# Patient Record
Sex: Female | Born: 1975
Health system: Southern US, Community
[De-identification: ages and names within clinical notes are randomized; demographics above are authoritative.]

## PROBLEM LIST (undated history)

## (undated) DIAGNOSIS — N200 Calculus of kidney: Secondary | ICD-10-CM

## (undated) DIAGNOSIS — IMO0002 Reserved for concepts with insufficient information to code with codable children: Secondary | ICD-10-CM

## (undated) DIAGNOSIS — I10 Essential (primary) hypertension: Secondary | ICD-10-CM

## (undated) DIAGNOSIS — I341 Nonrheumatic mitral (valve) prolapse: Secondary | ICD-10-CM

## (undated) DIAGNOSIS — K219 Gastro-esophageal reflux disease without esophagitis: Secondary | ICD-10-CM

## (undated) DIAGNOSIS — T7840XA Allergy, unspecified, initial encounter: Secondary | ICD-10-CM

## (undated) DIAGNOSIS — D649 Anemia, unspecified: Secondary | ICD-10-CM

## (undated) DIAGNOSIS — Z9889 Other specified postprocedural states: Secondary | ICD-10-CM

## (undated) DIAGNOSIS — J069 Acute upper respiratory infection, unspecified: Secondary | ICD-10-CM

## (undated) DIAGNOSIS — R112 Nausea with vomiting, unspecified: Secondary | ICD-10-CM

## (undated) DIAGNOSIS — Z87442 Personal history of urinary calculi: Secondary | ICD-10-CM

## (undated) DIAGNOSIS — J45909 Unspecified asthma, uncomplicated: Secondary | ICD-10-CM

## (undated) DIAGNOSIS — R7303 Prediabetes: Secondary | ICD-10-CM

## (undated) DIAGNOSIS — L309 Dermatitis, unspecified: Secondary | ICD-10-CM

## (undated) HISTORY — DX: Dermatitis, unspecified: L30.9

## (undated) HISTORY — PX: ADENOIDECTOMY, TONSILLECTOMY AND MYRINGOTOMY WITH TUBE PLACEMENT: SHX5716

## (undated) HISTORY — PX: TUBAL LIGATION: SHX77

## (undated) HISTORY — PX: ADENOIDECTOMY: SUR15

## (undated) HISTORY — PX: TONSILLECTOMY: SUR1361

## (undated) HISTORY — PX: FRACTURE SURGERY: SHX138

## (undated) HISTORY — DX: Unspecified asthma, uncomplicated: J45.909

## (undated) HISTORY — DX: Reserved for concepts with insufficient information to code with codable children: IMO0002

## (undated) HISTORY — DX: Allergy, unspecified, initial encounter: T78.40XA

## (undated) HISTORY — DX: Calculus of kidney: N20.0

## (undated) HISTORY — PX: LEG SURGERY: SHX1003

## (undated) HISTORY — DX: Acute upper respiratory infection, unspecified: J06.9

## (undated) HISTORY — DX: Anemia, unspecified: D64.9

## (undated) HISTORY — PX: HERNIA REPAIR: SHX51

## (undated) HISTORY — DX: Essential (primary) hypertension: I10

## (undated) HISTORY — PX: ABDOMINAL HYSTERECTOMY: SHX81

---

## 2013-12-17 ENCOUNTER — Encounter (HOSPITAL_COMMUNITY): Payer: Self-pay | Admitting: Emergency Medicine

## 2013-12-17 ENCOUNTER — Emergency Department (HOSPITAL_COMMUNITY)
Admission: EM | Admit: 2013-12-17 | Discharge: 2013-12-17 | Disposition: A | Discharge status: Home / Self Care | Payer: 59 | Source: Home / Self Care | Attending: Emergency Medicine | Admitting: Emergency Medicine

## 2013-12-17 DIAGNOSIS — J069 Acute upper respiratory infection, unspecified: Secondary | ICD-10-CM

## 2013-12-17 LAB — POCT RAPID STREP A: Streptococcus, Group A Screen (Direct): NEGATIVE

## 2013-12-17 MED ORDER — HYDROCOD POLST-CHLORPHEN POLST 10-8 MG/5ML PO LQCR: 5.0000 mL | Freq: Two times a day (BID) | ORAL | Status: DC | PRN

## 2013-12-17 MED ORDER — IPRATROPIUM BROMIDE 0.06 % NA SOLN: 2.0000 | Freq: Four times a day (QID) | NASAL | Status: DC

## 2013-12-17 MED ORDER — ONDANSETRON 8 MG PO TBDP: 8.0000 mg | ORAL_TABLET | Freq: Three times a day (TID) | ORAL | Status: DC | PRN

## 2013-12-17 MED ORDER — ALBUTEROL SULFATE HFA 108 (90 BASE) MCG/ACT IN AERS: 1.0000 | INHALATION_SPRAY | Freq: Four times a day (QID) | RESPIRATORY_TRACT | Status: DC | PRN

## 2013-12-17 NOTE — ED Provider Notes (Signed)
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 She denies any fever. Her husband and son had the same thing.  Review of Systems   Other than as noted above, the patient denies any of the following symptoms: Systemic:  No fevers, chills, sweats, or myalgias. Eye:  No redness or discharge. ENT:  No ear pain, headache, nasal congestion, drainage, sinus pressure, or sore throat. Neck:  No neck pain, stiffness, or swollen glands. Lungs:  No cough, sputum production, hemoptysis, wheezing, chest tightness, shortness of breath or chest pain. GI:  No abdominal pain, nausea, vomiting or diarrhea.  PMFSH   Past medical history, family history, social history, meds, and allergies were reviewed. She is allergic to erythromycin, sulfa, and azithromycin.  Physical exam   Vital signs:  BP 147/91  Pulse 82  Temp(Src) 98.8 F (37.1 C) (Oral)  Resp 16  SpO2 96% General:  Alert and oriented.  In no distress.  Skin warm and dry. Eye:  No conjunctival injection or drainage. Lids were normal. ENT:  TMs and canals were normal, without erythema or inflammation.  Nasal mucosa was clear and uncongested, without drainage.  Mucous membranes were moist.  Pharynx was clear with no exudate or drainage.  There were no oral ulcerations or lesions. Neck:  Supple, no adenopathy, tenderness or mass. Lungs:  No respiratory distress.  Lungs were clear to auscultation, without wheezes, rales or rhonchi.  Breath sounds were clear and equal bilaterally.  Heart:  Regular rhythm, without gallops, murmers or rubs. Skin:  Clear, warm, and dry, without rash or lesions.  Labs   Results for orders placed during the hospital encounter of  12/17/13  POCT RAPID STREP A ([facility] CARE ONLY)      Result Value Ref Range   Streptococcus, Group A Screen (Direct) NEGATIVE  NEGATIVE    Assessment     The encounter diagnosis was Viral URI.  No evidence of pneumonia, strep throat, or sinusitis, no indication for antibiotics.  Plan    1.  Meds:  The following meds were prescribed:   Discharge Medication List as of 12/17/2013 12:38 PM    START taking these medications   Details  albuterol (PROVENTIL HFA;VENTOLIN HFA) 108 (90 BASE) MCG/ACT inhaler Inhale 1-2 puffs into the lungs every 6 (six) hours as needed for wheezing or shortness of breath., Starting 12/17/2013, Until Discontinued, Normal    chlorpheniramine-HYDROcodone (TUSSIONEX) 10-8 MG/5ML LQCR Take 5 mLs by mouth every 12 (twelve) hours as needed for cough., Starting 12/17/2013, Until Discontinued, Normal    ipratropium (ATROVENT) 0.06 % nasal spray Place 2 sprays into both nostrils 4 (four) times daily., Starting 12/17/2013, Until Discontinued, Normal    ondansetron (ZOFRAN ODT) 8 MG disintegrating tablet Take 1 tablet (8 mg total) by mouth every 8 (eight) hours as needed for nausea., Starting 12/17/2013, Until Discontinued, Normal        2.  Patient Education/Counseling:  The patient was given appropriate handouts, self care instructions, and instructed in symptomatic relief.  Instructed to get extra fluids and extra rest.    3.  Follow up:  The patient was told to follow up here if no better in 3 to 4 days, or sooner if becoming worse in any  way, and given some red flag symptoms such as increasing fever, difficulty breathing, chest pain, or persistent vomiting which would prompt immediate return.       Reuben Likesavid C Hedaya Latendresse, MD 12/17/13 [phone]

## 2013-12-17 NOTE — ED Notes (Signed)
38 year old female c/o URI symptoms for the past 2 days.  Has sinus pressure and drainage, cough and sore throat.

## 2013-12-17 NOTE — Discharge Instructions (Signed)

## 2013-12-19 LAB — CULTURE, GROUP A STREP

## 2014-10-25 ENCOUNTER — Other Ambulatory Visit (HOSPITAL_COMMUNITY): Payer: Self-pay | Admitting: Family Medicine

## 2014-10-25 DIAGNOSIS — Z1231 Encounter for screening mammogram for malignant neoplasm of breast: Secondary | ICD-10-CM

## 2014-10-30 ENCOUNTER — Ambulatory Visit (HOSPITAL_COMMUNITY)
Admission: RE | Admit: 2014-10-30 | Discharge: 2014-10-30 | Disposition: A | Discharge status: Home / Self Care | Payer: 59 | Source: Ambulatory Visit | Attending: Family Medicine | Admitting: Family Medicine

## 2014-10-30 DIAGNOSIS — Z1231 Encounter for screening mammogram for malignant neoplasm of breast: Secondary | ICD-10-CM | POA: Diagnosis not present

## 2014-11-08 ENCOUNTER — Other Ambulatory Visit: Payer: Self-pay | Admitting: Family Medicine

## 2014-11-08 DIAGNOSIS — R928 Other abnormal and inconclusive findings on diagnostic imaging of breast: Secondary | ICD-10-CM

## 2014-11-09 ENCOUNTER — Other Ambulatory Visit (HOSPITAL_COMMUNITY): Payer: Self-pay | Admitting: Physician Assistant

## 2014-11-09 DIAGNOSIS — R928 Other abnormal and inconclusive findings on diagnostic imaging of breast: Secondary | ICD-10-CM

## 2014-11-14 ENCOUNTER — Ambulatory Visit (HOSPITAL_COMMUNITY)
Admission: RE | Admit: 2014-11-14 | Discharge: 2014-11-14 | Disposition: A | Discharge status: Home / Self Care | Payer: 59 | Source: Ambulatory Visit | Attending: Physician Assistant | Admitting: Physician Assistant

## 2014-11-14 ENCOUNTER — Encounter (HOSPITAL_COMMUNITY): Payer: 59

## 2014-11-14 ENCOUNTER — Other Ambulatory Visit (HOSPITAL_COMMUNITY): Payer: Self-pay | Admitting: Physician Assistant

## 2014-11-14 DIAGNOSIS — R928 Other abnormal and inconclusive findings on diagnostic imaging of breast: Secondary | ICD-10-CM | POA: Insufficient documentation

## 2014-11-14 DIAGNOSIS — N6489 Other specified disorders of breast: Secondary | ICD-10-CM

## 2015-04-09 DIAGNOSIS — Z1389 Encounter for screening for other disorder: Secondary | ICD-10-CM | POA: Diagnosis not present

## 2015-04-09 DIAGNOSIS — J019 Acute sinusitis, unspecified: Secondary | ICD-10-CM | POA: Diagnosis not present

## 2015-04-09 DIAGNOSIS — E559 Vitamin D deficiency, unspecified: Secondary | ICD-10-CM | POA: Diagnosis not present

## 2015-05-18 DIAGNOSIS — Z6841 Body Mass Index (BMI) 40.0 and over, adult: Secondary | ICD-10-CM | POA: Diagnosis not present

## 2015-05-18 DIAGNOSIS — Z1389 Encounter for screening for other disorder: Secondary | ICD-10-CM | POA: Diagnosis not present

## 2015-05-18 DIAGNOSIS — J019 Acute sinusitis, unspecified: Secondary | ICD-10-CM | POA: Diagnosis not present

## 2015-05-18 DIAGNOSIS — E559 Vitamin D deficiency, unspecified: Secondary | ICD-10-CM | POA: Diagnosis not present

## 2015-08-30 DIAGNOSIS — Z6841 Body Mass Index (BMI) 40.0 and over, adult: Secondary | ICD-10-CM | POA: Diagnosis not present

## 2015-08-30 DIAGNOSIS — Z1389 Encounter for screening for other disorder: Secondary | ICD-10-CM | POA: Diagnosis not present

## 2015-08-30 DIAGNOSIS — R030 Elevated blood-pressure reading, without diagnosis of hypertension: Secondary | ICD-10-CM | POA: Diagnosis not present

## 2015-10-01 DIAGNOSIS — Z1389 Encounter for screening for other disorder: Secondary | ICD-10-CM | POA: Diagnosis not present

## 2015-10-01 DIAGNOSIS — L0291 Cutaneous abscess, unspecified: Secondary | ICD-10-CM | POA: Diagnosis not present

## 2015-10-01 DIAGNOSIS — B9562 Methicillin resistant Staphylococcus aureus infection as the cause of diseases classified elsewhere: Secondary | ICD-10-CM | POA: Diagnosis not present

## 2015-10-01 DIAGNOSIS — Z6841 Body Mass Index (BMI) 40.0 and over, adult: Secondary | ICD-10-CM | POA: Diagnosis not present

## 2015-10-01 MED FILL — DOXYCYCLINE HYCLATE 100 MG: 100 | 7 days supply | Qty: 14 | Fill #0

## 2015-11-23 DIAGNOSIS — J329 Chronic sinusitis, unspecified: Secondary | ICD-10-CM | POA: Diagnosis not present

## 2015-11-23 DIAGNOSIS — Z6841 Body Mass Index (BMI) 40.0 and over, adult: Secondary | ICD-10-CM | POA: Diagnosis not present

## 2015-11-23 DIAGNOSIS — Z1389 Encounter for screening for other disorder: Secondary | ICD-10-CM | POA: Diagnosis not present

## 2015-11-23 DIAGNOSIS — J029 Acute pharyngitis, unspecified: Secondary | ICD-10-CM | POA: Diagnosis not present

## 2015-12-04 ENCOUNTER — Emergency Department (HOSPITAL_COMMUNITY)
Admission: EM | Admit: 2015-12-04 | Discharge: 2015-12-05 | Disposition: A | Discharge status: Home / Self Care | Payer: 59 | Attending: Emergency Medicine | Admitting: Emergency Medicine

## 2015-12-04 ENCOUNTER — Emergency Department (HOSPITAL_COMMUNITY): Payer: 59

## 2015-12-04 ENCOUNTER — Encounter (HOSPITAL_COMMUNITY): Payer: Self-pay | Admitting: *Deleted

## 2015-12-04 DIAGNOSIS — K219 Gastro-esophageal reflux disease without esophagitis: Secondary | ICD-10-CM | POA: Diagnosis not present

## 2015-12-04 DIAGNOSIS — R0789 Other chest pain: Secondary | ICD-10-CM

## 2015-12-04 HISTORY — DX: Nonrheumatic mitral (valve) prolapse: I34.1

## 2015-12-04 LAB — CBC
HCT: 37.2 % (ref 36.0–46.0)
Hemoglobin: 12.3 g/dL (ref 12.0–15.0)
MCH: 29.4 pg (ref 26.0–34.0)
MCHC: 33.1 g/dL (ref 30.0–36.0)
MCV: 88.8 fL (ref 78.0–100.0)
PLATELETS: 256 10*3/uL (ref 150–400)
RBC: 4.19 MIL/uL (ref 3.87–5.11)
RDW: 14 % (ref 11.5–15.5)
WBC: 10.2 10*3/uL (ref 4.0–10.5)

## 2015-12-04 NOTE — ED Triage Notes (Signed)
Pt c/o having a feeling of heartburn earlier and when she went to lay down she felt like her heart was racing; pt states she has not been feeling well since she received the flu shot yesterday

## 2015-12-04 NOTE — ED Provider Notes (Signed)
[facility]-EMERGENCY DEPT Provider Note   CSN: [id number] Arrival date & time: 12/04/15  2314  By signing my name below, I, Monica Hernandez, attest that this documentation has been prepared under the direction and in the presence of Devoria AlbeIva Dublin Cantero, MD. Electronically Signed: Phillis HaggisGabriella Hernandez, ED Scribe. 12/04/15. 12:17 AM.  Time seen 12:02 AM   History   Chief Complaint Chief Complaint  Patient presents with  . Chest Pain   The history is provided by the patient. No language interpreter was used.   HPI Comments: Monica Hernandez is a 40 y.o. female with a hx of mitral valve prolapse who presents to the Emergency Department complaining of burning, pressured, squeezing, gradually resolving central chest pain onset around 6 pm. She reports associated nausea, palpitations, hot flashes, and heartburn that began today. The nausea and hot flashes have since resolved; they lasted about 3 hours. Pt had intermittent hot flashes today preceding her chest pain. Pt laid in bed this evening when she began to have SOB. Pt reports relief with sitting up. Pt reports that her symptoms began after she received her flu shot at 3 PM yesterday. She had nausea and hot flashes that lasted about 3 hours shortly after she got the flu shot and then she felt fine. Pt has had the flu shot in the past with no adverse effects. She notes that she recently finished a course of antibiotics for a respiratory infection. Pt took an over-the-counter Zantac for her heartburn which relieved that, but then developed a pressure in her chest and became squeezing when she laid down. She states laying flat [place] it feel worse, sitting up made it better. Pt was previously on prescription Zantac for peptic ulcers years ago. Pt is a former smoker and drinker. Pt reports family hx of heart disease. Pt's father had 2 heart attacks before the age of 450 and died at age 42 of terminal cancer. Pt's mother has hx of stroke and is still living at age 42 with  dementia. Pt denies nausea tonight, diaphoresis, fever, abdominal pain, or vomiting. Pt denies use of daily medications. She states she felt something like this before when she took contrary to lose weight and she had heart palpitations. She states when she first got to the ED she felt like her heart was fluttering and then she got very shaky.  PCP [facility]  Past Medical History:  Diagnosis Date  . Gestational diabetes   . Mitral valve prolapse     There are no active problems to display for this patient.   Past Surgical History:  Procedure Laterality Date  . ABDOMINAL HYSTERECTOMY    . ADENOIDECTOMY, TONSILLECTOMY AND MYRINGOTOMY WITH TUBE PLACEMENT    . LEG SURGERY Left   . TUBAL LIGATION      OB History    No data available     Home Medications    Prior to Admission medications   Medication Sig Start Date End Date Taking? Authorizing Provider  omeprazole (PRILOSEC) 20 MG capsule Take 1 po BID x 2 weeks then once a day 12/05/15   Devoria AlbeIva Garvin Ellena, MD    Family History History reviewed. No pertinent family history.  Social History Social History  Substance Use Topics  . Smoking status: Never Smoker  . Smokeless tobacco: Never Used  . Alcohol use No  employed   Allergies   Erythromycin; Zithromax [azithromycin]; and Sulfa antibiotics   Review of Systems Review of Systems  Constitutional: Negative for diaphoresis and fever.  Respiratory:  Positive for shortness of breath.   Cardiovascular: Positive for chest pain and palpitations.  Gastrointestinal: Positive for nausea. Negative for abdominal pain and vomiting.  All other systems reviewed and are negative.  Physical Exam Updated Vital Signs BP 151/80 (BP Location: Right Arm)   Pulse 85   Temp 98.1 F (36.7 C) (Oral)   Resp 17   Ht 5\' 5"  (1.651 m)   Wt 299 lb (135.6 kg)   SpO2 98%   BMI 49.76 kg/m   Vital signs normal    Physical Exam  Constitutional: She is oriented to person, place, and time. She  appears well-developed and well-nourished.  Non-toxic appearance. She does not appear ill. No distress.  HENT:  Head: Normocephalic and atraumatic.  Right Ear: External ear normal.  Left Ear: External ear normal.  Nose: Nose normal. No mucosal edema or rhinorrhea.  Mouth/Throat: Oropharynx is clear and moist and mucous membranes are normal. No dental abscesses or uvula swelling.  Eyes: Conjunctivae and EOM are normal. Pupils are equal, round, and reactive to light.  Neck: Normal range of motion and full passive range of motion without pain. Neck supple.  Cardiovascular: Normal rate, regular rhythm and normal heart sounds.  Exam reveals no gallop and no friction rub.   No murmur heard. Pulmonary/Chest: Effort normal and breath sounds normal. No respiratory distress. She has no wheezes. She has no rhonchi. She has no rales. She exhibits no tenderness and no crepitus.  Abdominal: Soft. Normal appearance and bowel sounds are normal. She exhibits no distension. There is no tenderness. There is no rebound and no guarding.  Musculoskeletal: Normal range of motion. She exhibits no edema or tenderness.  Moves all extremities well.   Neurological: She is alert and oriented to person, place, and time. She has normal strength. No cranial nerve deficit.  Skin: Skin is warm, dry and intact. No rash noted. No erythema. No pallor.  Psychiatric: She has a normal mood and affect. Her speech is normal and behavior is normal. Her mood appears not anxious.  Nursing note and vitals reviewed.    ED Treatments / Results  Labs (all labs ordered are listed, but only abnormal results are displayed) Results for orders placed or performed during the hospital encounter of 12/04/15  Basic metabolic panel  Result Value Ref Range   Sodium 137 135 - 145 mmol/L   Potassium 3.6 3.5 - 5.1 mmol/L   Chloride 104 101 - 111 mmol/L   CO2 26 22 - 32 mmol/L   Glucose, Bld 116 (H) 65 - 99 mg/dL   BUN 14 6 - 20 mg/dL    Creatinine, Ser 1.61 0.44 - 1.00 mg/dL   Calcium 8.9 8.9 - 09.6 mg/dL   GFR calc non Af Amer >60 >60 mL/min   GFR calc Af Amer >60 >60 mL/min   Anion gap 7 5 - 15  CBC  Result Value Ref Range   WBC 10.2 4.0 - 10.5 K/uL   RBC 4.19 3.87 - 5.11 MIL/uL   Hemoglobin 12.3 12.0 - 15.0 g/dL   HCT 04.5 40.9 - 81.1 %   MCV 88.8 78.0 - 100.0 fL   MCH 29.4 26.0 - 34.0 pg   MCHC 33.1 30.0 - 36.0 g/dL   RDW 91.4 78.2 - 95.6 %   Platelets 256 150 - 400 K/uL  Lipase, blood  Result Value Ref Range   Lipase 28 11 - 51 U/L  Hepatic function panel  Result Value Ref Range   Total Protein  7.4 6.5 - 8.1 g/dL   Albumin 3.9 3.5 - 5.0 g/dL   AST 16 15 - 41 U/L   ALT 13 (L) 14 - 54 U/L   Alkaline Phosphatase 72 38 - 126 U/L   Total Bilirubin 0.2 (L) 0.3 - 1.2 mg/dL   Bilirubin, Direct <1.6 (L) 0.1 - 0.5 mg/dL   Indirect Bilirubin NOT CALCULATED 0.3 - 0.9 mg/dL  D-dimer, quantitative  Result Value Ref Range   D-Dimer, Quant <0.27 0.00 - 0.50 ug/mL-FEU  Troponin I  Result Value Ref Range   Troponin I <0.03 <0.03 ng/mL  Troponin I  Result Value Ref Range   Troponin I <0.03 <0.03 ng/mL   Laboratory interpretation all normal     EKG  EKG Interpretation  Date/Time:  Tuesday December 04 2015 23:26:06 EDT Ventricular Rate:  83 PR Interval:    QRS Duration: 102 QT Interval:  379 QTC Calculation: 446 R Axis:   45 Text Interpretation:  Sinus rhythm U waves present Otherwise within normal limits No old tracing to compare Confirmed by Glenda Kunst  MD-I, Akeira Lahm ([id number]) on 12/04/2015 11:54:43 PM       Radiology Dg Chest 2 View  Result Date: 12/05/2015 CLINICAL DATA:  Acute onset of intermittent left-sided chest pain. Initial encounter. EXAM: CHEST  2 VIEW COMPARISON:  None. FINDINGS: The lungs are well-aerated and clear. There is no evidence of focal opacification, pleural effusion or pneumothorax. The heart is normal in size. No acute osseous abnormalities are seen. IMPRESSION: No acute cardiopulmonary  process seen. Electronically Signed   By: Roanna Raider M.D.   On: 12/05/2015 00:40    Procedures Procedures (including critical care time)  Medications Ordered in ED Medications  gi cocktail (Maalox,Lidocaine,Donnatal) (30 mLs Oral Given 12/05/15 0023)  famotidine (PEPCID) tablet 20 mg (20 mg Oral Given 12/05/15 0023)     Initial Impression / Assessment and Plan / ED Course    I have reviewed the triage vital signs and the nursing notes.  Pertinent labs & imaging results that were available during my care of the patient were reviewed by me and considered in my medical decision making (see chart for details).  Clinical Course   COORDINATION OF CARE: 12:12 AM-Discussed treatment plan which includes labs and EKG with pt at bedside and pt agreed to plan. Patient was given a GI cocktail and Pepcid for her presumed reflux symptoms.  Patient was rechecked at 1:30, she relates her symptoms are gone. At this point we discussed her laboratory test results and need to get a delta troponin.  Patient's delta troponin was negative. Her symptoms are felt to be related to GERD or possibly recurrence of her peptic ulcer disease. She also may have a hiatal hernia.    Final Clinical Impressions(s) / ED Diagnoses   Final diagnoses:  Other chest pain  Gastroesophageal reflux disease without esophagitis    New Prescriptions Discharge Medication List as of 12/05/2015  2:52 AM    START taking these medications   Details  omeprazole (PRILOSEC) 20 MG capsule Take 1 po BID x 2 weeks then once a day, Print        Plan discharge  Devoria Albe, MD, FACEP  I personally performed the services described in this documentation, which was scribed in my presence. The recorded information has been reviewed and considered.  Devoria Albe, MD, Concha Pyo, MD 12/05/15 [phone]

## 2015-12-05 DIAGNOSIS — K219 Gastro-esophageal reflux disease without esophagitis: Secondary | ICD-10-CM | POA: Diagnosis not present

## 2015-12-05 DIAGNOSIS — R079 Chest pain, unspecified: Secondary | ICD-10-CM | POA: Diagnosis not present

## 2015-12-05 LAB — HEPATIC FUNCTION PANEL
ALK PHOS: 72 U/L (ref 38–126)
ALT: 13 U/L — ABNORMAL LOW (ref 14–54)
AST: 16 U/L (ref 15–41)
Albumin: 3.9 g/dL (ref 3.5–5.0)
BILIRUBIN TOTAL: 0.2 mg/dL — AB (ref 0.3–1.2)
Bilirubin, Direct: <0.1 mg/dL — ABNORMAL LOW (ref 0.1–0.5)
Total Protein: 7.4 g/dL (ref 6.5–8.1)

## 2015-12-05 LAB — BASIC METABOLIC PANEL
Anion gap: 7 (ref 5–15)
BUN: 14 mg/dL (ref 6–20)
CO2: 26 mmol/L (ref 22–32)
Calcium: 8.9 mg/dL (ref 8.9–10.3)
Chloride: 104 mmol/L (ref 101–111)
Creatinine, Ser: 0.85 mg/dL (ref 0.44–1.00)
GFR calc Af Amer: >60 mL/min (ref >60)
GFR calc non Af Amer: >60 mL/min (ref >60)
GLUCOSE: 116 mg/dL — AB (ref 65–99)
POTASSIUM: 3.6 mmol/L (ref 3.5–5.1)
Sodium: 137 mmol/L (ref 135–145)

## 2015-12-05 LAB — TROPONIN I
Troponin I: <0.03 ng/mL (ref <0.03)
Troponin I: <0.03 ng/mL (ref <0.03)

## 2015-12-05 LAB — LIPASE, BLOOD: Lipase: 28 U/L (ref 11–51)

## 2015-12-05 LAB — D-DIMER, QUANTITATIVE: D-Dimer, Quant: <0.27 ug/mL-FEU (ref 0.00–0.50)

## 2015-12-05 MED ORDER — FAMOTIDINE 20 MG PO TABS
20.0000 mg | ORAL_TABLET | Freq: Once | ORAL | Status: AC
  Administered 2015-12-05: 20 mg via ORAL
  Filled 2015-12-05: qty 1

## 2015-12-05 MED ORDER — GI COCKTAIL ~~LOC~~
30.0000 mL | Freq: Once | ORAL | Status: AC
  Administered 2015-12-05: 30 mL via ORAL
  Filled 2015-12-05: qty 30

## 2015-12-05 MED ORDER — OMEPRAZOLE 20 MG PO CPDR: DELAYED_RELEASE_CAPSULE | ORAL | 0 refills | Status: DC

## 2015-12-05 NOTE — ED Notes (Signed)
Patient transported to X-ray 

## 2015-12-05 NOTE — ED Notes (Signed)
Patient returned from radiology

## 2015-12-05 NOTE — Discharge Instructions (Signed)
Avoid fried, spicy, greasy food. Take the Prilosec as directed. Recheck if you get worse again.

## 2015-12-14 DIAGNOSIS — Z1389 Encounter for screening for other disorder: Secondary | ICD-10-CM | POA: Diagnosis not present

## 2015-12-14 DIAGNOSIS — Z6841 Body Mass Index (BMI) 40.0 and over, adult: Secondary | ICD-10-CM | POA: Diagnosis not present

## 2015-12-14 DIAGNOSIS — E669 Obesity, unspecified: Secondary | ICD-10-CM | POA: Diagnosis not present

## 2015-12-14 DIAGNOSIS — N342 Other urethritis: Secondary | ICD-10-CM | POA: Diagnosis not present

## 2015-12-14 DIAGNOSIS — K219 Gastro-esophageal reflux disease without esophagitis: Secondary | ICD-10-CM | POA: Diagnosis not present

## 2015-12-14 MED FILL — CIPROFLOXACIN HCL 500 MG TA: 500 | 7 days supply | Qty: 14 | Fill #0

## 2015-12-28 DIAGNOSIS — E669 Obesity, unspecified: Secondary | ICD-10-CM | POA: Diagnosis not present

## 2015-12-28 DIAGNOSIS — N390 Urinary tract infection, site not specified: Secondary | ICD-10-CM | POA: Diagnosis not present

## 2015-12-28 DIAGNOSIS — Z1389 Encounter for screening for other disorder: Secondary | ICD-10-CM | POA: Diagnosis not present

## 2015-12-28 DIAGNOSIS — N342 Other urethritis: Secondary | ICD-10-CM | POA: Diagnosis not present

## 2015-12-28 DIAGNOSIS — Z6841 Body Mass Index (BMI) 40.0 and over, adult: Secondary | ICD-10-CM | POA: Diagnosis not present

## 2015-12-28 MED FILL — levoFLOXacin 750 MG TABS: 750 | 7 days supply | Qty: 7 | Fill #0

## 2016-01-24 DIAGNOSIS — R102 Pelvic and perineal pain: Secondary | ICD-10-CM | POA: Diagnosis not present

## 2016-01-24 DIAGNOSIS — Z6841 Body Mass Index (BMI) 40.0 and over, adult: Secondary | ICD-10-CM | POA: Diagnosis not present

## 2016-02-07 DIAGNOSIS — R102 Pelvic and perineal pain: Secondary | ICD-10-CM | POA: Diagnosis not present

## 2016-02-07 MED FILL — HEATHER TABLET: 0.35 | 28 days supply | Qty: 28 | Fill #0

## 2016-02-10 ENCOUNTER — Encounter (HOSPITAL_COMMUNITY): Payer: Self-pay | Admitting: *Deleted

## 2016-02-10 ENCOUNTER — Ambulatory Visit (HOSPITAL_COMMUNITY)
Admission: EM | Admit: 2016-02-10 | Discharge: 2016-02-10 | Disposition: A | Discharge status: Home / Self Care | Payer: 59 | Attending: Emergency Medicine | Admitting: Emergency Medicine

## 2016-02-10 DIAGNOSIS — X58XXXA Exposure to other specified factors, initial encounter: Secondary | ICD-10-CM | POA: Insufficient documentation

## 2016-02-10 DIAGNOSIS — R0982 Postnasal drip: Secondary | ICD-10-CM | POA: Diagnosis not present

## 2016-02-10 DIAGNOSIS — H9201 Otalgia, right ear: Secondary | ICD-10-CM | POA: Insufficient documentation

## 2016-02-10 DIAGNOSIS — Z79899 Other long term (current) drug therapy: Secondary | ICD-10-CM | POA: Diagnosis not present

## 2016-02-10 DIAGNOSIS — Z87891 Personal history of nicotine dependence: Secondary | ICD-10-CM | POA: Diagnosis not present

## 2016-02-10 DIAGNOSIS — R0981 Nasal congestion: Secondary | ICD-10-CM | POA: Insufficient documentation

## 2016-02-10 DIAGNOSIS — J00 Acute nasopharyngitis [common cold]: Secondary | ICD-10-CM | POA: Diagnosis not present

## 2016-02-10 DIAGNOSIS — E669 Obesity, unspecified: Secondary | ICD-10-CM | POA: Diagnosis not present

## 2016-02-10 DIAGNOSIS — R059 Cough, unspecified: Secondary | ICD-10-CM

## 2016-02-10 DIAGNOSIS — R05 Cough: Secondary | ICD-10-CM | POA: Diagnosis not present

## 2016-02-10 DIAGNOSIS — J069 Acute upper respiratory infection, unspecified: Secondary | ICD-10-CM

## 2016-02-10 DIAGNOSIS — T700XXA Otitic barotrauma, initial encounter: Secondary | ICD-10-CM | POA: Insufficient documentation

## 2016-02-10 HISTORY — DX: Gastro-esophageal reflux disease without esophagitis: K21.9

## 2016-02-10 LAB — POCT RAPID STREP A: Streptococcus, Group A Screen (Direct): NEGATIVE

## 2016-02-10 MED ORDER — ALBUTEROL SULFATE HFA 108 (90 BASE) MCG/ACT IN AERS: 2.0000 | INHALATION_SPRAY | RESPIRATORY_TRACT | 0 refills | Status: DC | PRN

## 2016-02-10 NOTE — ED Triage Notes (Signed)
C/O sore throat x6 days; productive cough onset 2 days ago, right earache yesterday.  Has been taking Nyquil, Tyl Cold & Sinus, and Robitussin without any relief.  Unk if any fevers.

## 2016-02-10 NOTE — ED Provider Notes (Signed)
CSN: [id number]     Arrival date & time 02/10/16  1203 History   First MD Initiated Contact with Patient 02/10/16 1243     Chief Complaint  Patient presents with  . Cough  . Sore Throat   (Consider location/radiation/quality/duration/timing/severity/associated sxs/prior Treatment) 40 year old obese female complaining of right earache, sore throat, PND, since of chest congestion, nasal congestion and occasional might wheezes. Denies fever.      Past Medical History:  Diagnosis Date  . GERD (gastroesophageal reflux disease)   . Mitral valve prolapse    Past Surgical History:  Procedure Laterality Date  . ABDOMINAL HYSTERECTOMY    . ADENOIDECTOMY, TONSILLECTOMY AND MYRINGOTOMY WITH TUBE PLACEMENT    . LEG SURGERY Left   . TUBAL LIGATION     No family history on file. Social History  Substance Use Topics  . Smoking status: Former Games developer  . Smokeless tobacco: Never Used  . Alcohol use No   OB History    No data available     Review of Systems  Constitutional: Positive for activity change. Negative for appetite change, chills, fatigue and fever.       Noted in HPI otherwise Negative  HENT: Positive for congestion, ear pain, postnasal drip, rhinorrhea and sore throat. Negative for facial swelling.        Noted in HPI, otherwise negative  Eyes: Negative.  Negative for pain.  Respiratory: Positive for cough and wheezing. Negative for chest tightness and shortness of breath.   Cardiovascular: Negative.  Negative for chest pain, palpitations and leg swelling.  Gastrointestinal: Negative.   Musculoskeletal: Negative.  Negative for neck pain and neck stiffness.  Skin: Negative for pallor and rash.  Neurological: Negative.   Psychiatric/Behavioral: Negative.   All other systems reviewed and are negative.   Allergies  Erythromycin; Zithromax [azithromycin]; and Sulfa antibiotics  Home Medications   Prior to Admission medications   Medication Sig Start Date End Date  Taking? Authorizing Provider  omeprazole (PRILOSEC) 20 MG capsule Take 1 po BID x 2 weeks then once a day 12/05/15  Yes Devoria Albe, MD  UNKNOWN TO PATIENT "A hormone replacement"   Yes Historical Provider, MD  albuterol (PROVENTIL HFA;VENTOLIN HFA) 108 (90 Base) MCG/ACT inhaler Inhale 2 puffs into the lungs every 4 (four) hours as needed for wheezing or shortness of breath. 02/10/16   Hayden Rasmussen, NP   Meds Ordered and Administered this Visit  Medications - No data to display  BP 154/92   Pulse 64   Temp 98 F (36.7 C) (Oral)   Resp 22   SpO2 97%  No data found.   Physical Exam  Constitutional: She is oriented to person, place, and time. She appears well-developed and well-nourished. No distress.  HENT:  Head: Normocephalic and atraumatic.  Right Ear: External ear normal.  Left Ear: External ear normal.  Mouth/Throat: No oropharyngeal exudate.  Bilateral TMs with retraction. No erythema or effusion. Oropharynx with minor erythema and clear PND, minor cobblestoning. No exudates or swelling.  Eyes: EOM are normal. Pupils are equal, round, and reactive to light.  Neck: Normal range of motion. Neck supple.  Cardiovascular: Normal rate, regular rhythm, normal heart sounds and intact distal pulses.   Pulmonary/Chest: Effort normal and breath sounds normal. No respiratory distress. She has no wheezes. She has no rales.  Musculoskeletal: Normal range of motion. She exhibits no edema.  Lymphadenopathy:    She has no cervical adenopathy.  Neurological: She is alert and oriented to person, place, and  time.  Skin: Skin is warm and dry. Capillary refill takes less than 2 seconds.  Psychiatric: She has a normal mood and affect.  Nursing note and vitals reviewed.   Urgent Care Course   Clinical Course     Procedures (including critical care time)  Labs Review Labs Reviewed - No data to display  Imaging Review No results found.   Visual Acuity Review  Right Eye Distance:   Left  Eye Distance:   Bilateral Distance:    Right Eye Near:   Left Eye Near:    Bilateral Near:         MDM   1. Acute URI   2. Nasopharyngitis   3. PND (post-nasal drip)   4. Cough   5. Barotitis media, initial encounter    No signs of bacterial infection in the ears throat or lungs. The drainage from your sinuses into the back of your throat is causing many of your symptoms including ear pain. The instructions on barotitis media can help explain that mechanism. The symptoms of a URI last proximally 7-10 days. Medicines can help but generally do not cure. Fissure to drink plenty of fluids and stay well-hydrated. Sudafed PE 10 mg every 4 to 6 hours as needed for congestion Allegra or Zyrtec daily as needed for drainage and runny nose. For stronger antihistamine may take Chlor-Trimeton 2 to 4 mg every 4 to 6 hours, may cause drowsiness. Saline nasal spray used frequently. Ibuprofen 600 mg every 6 hours as needed for pain, discomfort or fever. Drink plenty of fluids and stay well-hydrated. Flonase or Rhinocort nasal spray daily     Hayden Rasmussenavid Averey Koning, NP 02/10/16 1259

## 2016-02-10 NOTE — Discharge Instructions (Signed)
No signs of bacterial infection in the ears throat or lungs. The drainage from your sinuses into the back of your throat is causing many of your symptoms including ear pain. The instructions on barotitis media can help explain that mechanism. The symptoms of a URI last proximally 7-10 days. Medicines can help but generally do not cure. Fissure to drink plenty of fluids and stay well-hydrated. Sudafed PE 10 mg every 4 to 6 hours as needed for congestion Allegra or Zyrtec daily as needed for drainage and runny nose. For stronger antihistamine may take Chlor-Trimeton 2 to 4 mg every 4 to 6 hours, may cause drowsiness. Saline nasal spray used frequently. Ibuprofen 600 mg every 6 hours as needed for pain, discomfort or fever. Drink plenty of fluids and stay well-hydrated. Flonase or Rhinocort nasal spray daily

## 2016-02-13 LAB — CULTURE, GROUP A STREP (THRC)

## 2016-02-21 DIAGNOSIS — B373 Candidiasis of vulva and vagina: Secondary | ICD-10-CM | POA: Diagnosis not present

## 2016-02-21 DIAGNOSIS — Z6841 Body Mass Index (BMI) 40.0 and over, adult: Secondary | ICD-10-CM | POA: Diagnosis not present

## 2016-02-21 DIAGNOSIS — R300 Dysuria: Secondary | ICD-10-CM | POA: Diagnosis not present

## 2016-02-21 MED FILL — FLUCONAZOLE 150 MG TABLET: 150 | 6 days supply | Qty: 2 | Fill #0

## 2016-03-22 DIAGNOSIS — H52223 Regular astigmatism, bilateral: Secondary | ICD-10-CM | POA: Diagnosis not present

## 2016-04-09 DIAGNOSIS — A5900 Urogenital trichomoniasis, unspecified: Secondary | ICD-10-CM | POA: Diagnosis not present

## 2016-04-09 DIAGNOSIS — N760 Acute vaginitis: Secondary | ICD-10-CM | POA: Diagnosis not present

## 2016-04-09 DIAGNOSIS — Z202 Contact with and (suspected) exposure to infections with a predominantly sexual mode of transmission: Secondary | ICD-10-CM | POA: Diagnosis not present

## 2016-04-09 MED FILL — metroNIDAZOLE 500 MG TABS: 500 | 1 days supply | Qty: 4 | Fill #0

## 2016-04-30 ENCOUNTER — Encounter (HOSPITAL_COMMUNITY): Payer: Self-pay

## 2016-04-30 ENCOUNTER — Emergency Department (HOSPITAL_COMMUNITY)
Admission: EM | Admit: 2016-04-30 | Discharge: 2016-05-01 | Disposition: A | Discharge status: Home / Self Care | Payer: 59 | Attending: Emergency Medicine | Admitting: Emergency Medicine

## 2016-04-30 DIAGNOSIS — R1011 Right upper quadrant pain: Secondary | ICD-10-CM

## 2016-04-30 DIAGNOSIS — Z87891 Personal history of nicotine dependence: Secondary | ICD-10-CM | POA: Insufficient documentation

## 2016-04-30 DIAGNOSIS — K219 Gastro-esophageal reflux disease without esophagitis: Secondary | ICD-10-CM | POA: Insufficient documentation

## 2016-04-30 NOTE — ED Triage Notes (Signed)
Pt reports onset approx 45 mins ago of stabbing left chest pain that goes into her left shoulder, states she has had similar symptoms in the past with her acid reflux.

## 2016-05-01 DIAGNOSIS — Z87891 Personal history of nicotine dependence: Secondary | ICD-10-CM | POA: Diagnosis not present

## 2016-05-01 DIAGNOSIS — K219 Gastro-esophageal reflux disease without esophagitis: Secondary | ICD-10-CM | POA: Diagnosis not present

## 2016-05-01 LAB — COMPREHENSIVE METABOLIC PANEL
ALK PHOS: 65 U/L (ref 38–126)
ALT: 20 U/L (ref 14–54)
AST: 19 U/L (ref 15–41)
Albumin: 3.5 g/dL (ref 3.5–5.0)
Anion gap: 6 (ref 5–15)
BUN: 12 mg/dL (ref 6–20)
CO2: 26 mmol/L (ref 22–32)
Calcium: 9.2 mg/dL (ref 8.9–10.3)
Chloride: 105 mmol/L (ref 101–111)
Creatinine, Ser: 0.79 mg/dL (ref 0.44–1.00)
GFR calc non Af Amer: >60 mL/min (ref >60)
Glucose, Bld: 133 mg/dL — ABNORMAL HIGH (ref 65–99)
Potassium: 4.2 mmol/L (ref 3.5–5.1)
SODIUM: 137 mmol/L (ref 135–145)
Total Bilirubin: 0.2 mg/dL — ABNORMAL LOW (ref 0.3–1.2)
Total Protein: 7.1 g/dL (ref 6.5–8.1)

## 2016-05-01 LAB — CBC WITH DIFFERENTIAL/PLATELET
BASOS ABS: 0 10*3/uL (ref 0.0–0.1)
Basophils Relative: 0 %
EOS PCT: 4 %
Eosinophils Absolute: 0.3 10*3/uL (ref 0.0–0.7)
HCT: 36 % (ref 36.0–46.0)
Hemoglobin: 11.9 g/dL — ABNORMAL LOW (ref 12.0–15.0)
LYMPHS ABS: 2.7 10*3/uL (ref 0.7–4.0)
LYMPHS PCT: 30 %
MCH: 29 pg (ref 26.0–34.0)
MCHC: 33.1 g/dL (ref 30.0–36.0)
MCV: 87.6 fL (ref 78.0–100.0)
MONO ABS: 0.5 10*3/uL (ref 0.1–1.0)
Monocytes Relative: 6 %
Neutro Abs: 5.5 10*3/uL (ref 1.7–7.7)
Neutrophils Relative %: 60 %
PLATELETS: 226 10*3/uL (ref 150–400)
RBC: 4.11 MIL/uL (ref 3.87–5.11)
RDW: 13.8 % (ref 11.5–15.5)
WBC: 9.1 10*3/uL (ref 4.0–10.5)

## 2016-05-01 LAB — LIPASE, BLOOD: LIPASE: 19 U/L (ref 11–51)

## 2016-05-01 LAB — TROPONIN I: Troponin I: <0.03 ng/mL (ref <0.03)

## 2016-05-01 MED ORDER — GI COCKTAIL ~~LOC~~
30.0000 mL | Freq: Once | ORAL | Status: AC
  Administered 2016-05-01: 30 mL via ORAL
  Filled 2016-05-01: qty 30

## 2016-05-01 MED ORDER — FAMOTIDINE 20 MG PO TABS
20.0000 mg | ORAL_TABLET | Freq: Once | ORAL | Status: AC
  Administered 2016-05-01: 20 mg via ORAL
  Filled 2016-05-01: qty 1

## 2016-05-01 NOTE — ED Provider Notes (Signed)
[facility]-EMER[facility]Y DEPT Provider Note   CSN: [id number] Arrival date & time: 04/30/16  2304  By signing my name below, I, Bing Neighbors., attest that this documentation has been prepared under the direction and in the presence of No att. providers found. Electronically signed: Bing Neighbors., ED Scribe. 05/01/16. 2:59 AM.  Time seen 12:06 AM  History   Chief Complaint Chief Complaint  Patient presents with  . Chest Pain    HPI  Monica Hernandez is a 41 y.o. female with hx of hysterectomy who presents to the Emergency Department complaining of mild to moderate chest pain with sudden onset about 8 pm while sitting on a couch. Pt states that she developed the pain after eating pizza around 6:30 PM. She notes a similar episode the last time she ate pizza and was seen at the ED 3-4 months ago for it and actually has had this off and on for the past year with pizza, onions and french fries. The pain has been localized to the lower substernal chest and radiating into her L shoulder. The chest pain is intermittent and lasts 3-4 minutes, the left shoulder pain is constant  and pt describes as a pressure. She reports nausea, epigastric and RUQ abdominal pain that she describes as burning. Pt has taken an extra Omeprazole prior to eating the pizza with no relief and states that the pain is exacerbated with arm movement. She denies diaphoresis, vomiting, SOB. She denies smoking, alcohol use. Of note, pt states that she has had nausea, vomiting (4-6 times daily) and diarrhea (6-8 times daily) for the past x2 months. Pt reports increased stress lately after the death of her mother and starting a new job.   The history is provided by the patient. No language interpreter was used.   PCP [facility]   Past Medical History:  Diagnosis Date  . GERD (gastroesophageal reflux disease)   . Mitral valve prolapse     There are no active problems to display for this  patient.   Past Surgical History:  Procedure Laterality Date  . ABDOMINAL HYSTERECTOMY    . ADENOIDECTOMY, TONSILLECTOMY AND MYRINGOTOMY WITH TUBE PLACEMENT    . LEG SURGERY Left   . TUBAL LIGATION      OB History    No data available       Home Medications    Prior to Admission medications   Medication Sig Start Date End Date Taking? Authorizing Provider  albuterol (PROVENTIL HFA;VENTOLIN HFA) 108 (90 Base) MCG/ACT inhaler Inhale 2 puffs into the lungs every 4 (four) hours as needed for wheezing or shortness of breath. 02/10/16   Hayden Rasmussen, NP  omeprazole (PRILOSEC) 20 MG capsule Take 1 po BID x 2 weeks then once a day 12/05/15   Devoria Albe, MD  UNKNOWN TO PATIENT "A hormone replacement"    Historical Provider, MD    Family History No family history on file.  Social History Social History  Substance Use Topics  . Smoking status: Former Games developer  . Smokeless tobacco: Never Used  . Alcohol use No  employed   Allergies   Erythromycin; Zithromax [azithromycin]; Allegra-d [fexofenadine-pseudoephed er]; Contrave [naltrexone-bupropion hcl er]; and Sulfa antibiotics   Review of Systems Review of Systems  All other systems reviewed and are negative.   A complete 10 system review of systems was obtained and all systems are negative except as noted in the HPI and PMH.   Physical Exam Updated Vital Signs BP  122/61 (BP Location: Left Arm)   Pulse 77   Temp 98.8 F (37.1 C) (Oral)   Resp 18   Ht 5\' 4"  (1.626 m)   Wt (!) 310 lb (140.6 kg)   SpO2 98%   BMI 53.21 kg/m   Vital signs normal    Physical Exam  Constitutional: She is oriented to person, place, and time. She appears well-developed and well-nourished.  Non-toxic appearance. She does not appear ill. No distress.  HENT:  Head: Normocephalic and atraumatic.  Right Ear: External ear normal.  Left Ear: External ear normal.  Nose: Nose normal. No mucosal edema or rhinorrhea.  Mouth/Throat: Oropharynx is clear  and moist and mucous membranes are normal. No dental abscesses or uvula swelling.  Eyes: Conjunctivae and EOM are normal. Pupils are equal, round, and reactive to light.  Neck: Normal range of motion and full passive range of motion without pain. Neck supple.  Cardiovascular: Normal rate, regular rhythm and normal heart sounds.  Exam reveals no gallop and no friction rub.   No murmur heard. Pulmonary/Chest: Effort normal and breath sounds normal. No respiratory distress. She has no wheezes. She has no rhonchi. She has no rales. She exhibits no tenderness and no crepitus.    Tender L upper chest wall  Abdominal: Soft. Normal appearance and bowel sounds are normal. She exhibits no distension. There is tenderness in the right upper quadrant and epigastric area. There is no rebound and no guarding.    Musculoskeletal: Normal range of motion. She exhibits no edema or tenderness.  Moves all extremities well.   Neurological: She is alert and oriented to person, place, and time. She has normal strength. No cranial nerve deficit.  Skin: Skin is warm, dry and intact. No rash noted. No erythema. No pallor.  Psychiatric: She has a normal mood and affect. Her speech is normal and behavior is normal. Her mood appears not anxious.  Nursing note and vitals reviewed.    ED Treatments / Results   DIAGNOSTIC STUDIES: Oxygen Saturation is 98% on RA, normal by my interpretation.     Labs (all labs ordered are listed, but only abnormal results are displayed) Results for orders placed or performed during the hospital encounter of 04/30/16  Comprehensive metabolic panel  Result Value Ref Range   Sodium 137 135 - 145 mmol/L   Potassium 4.2 3.5 - 5.1 mmol/L   Chloride 105 101 - 111 mmol/L   CO2 26 22 - 32 mmol/L   Glucose, Bld 133 (H) 65 - 99 mg/dL   BUN 12 6 - 20 mg/dL   Creatinine, Ser 0.980.79 0.44 - 1.00 mg/dL   Calcium 9.2 8.9 - 11.910.3 mg/dL   Total Protein 7.1 6.5 - 8.1 g/dL   Albumin 3.5 3.5 - 5.0  g/dL   AST 19 15 - 41 U/L   ALT 20 14 - 54 U/L   Alkaline Phosphatase 65 38 - 126 U/L   Total Bilirubin 0.2 (L) 0.3 - 1.2 mg/dL   GFR calc non Af Amer >60 >60 mL/min   GFR calc Af Amer >60 >60 mL/min   Anion gap 6 5 - 15  Troponin I  Result Value Ref Range   Troponin I <0.03 <0.03 ng/mL  Lipase, blood  Result Value Ref Range   Lipase 19 11 - 51 U/L  CBC with Differential  Result Value Ref Range   WBC 9.1 4.0 - 10.5 K/uL   RBC 4.11 3.87 - 5.11 MIL/uL   Hemoglobin 11.9 (L)  12.0 - 15.0 g/dL   HCT 16.1 09.6 - 04.5 %   MCV 87.6 78.0 - 100.0 fL   MCH 29.0 26.0 - 34.0 pg   MCHC 33.1 30.0 - 36.0 g/dL   RDW 40.9 81.1 - 91.4 %   Platelets 226 150 - 400 K/uL   Neutrophils Relative % 60 %   Neutro Abs 5.5 1.7 - 7.7 K/uL   Lymphocytes Relative 30 %   Lymphs Abs 2.7 0.7 - 4.0 K/uL   Monocytes Relative 6 %   Monocytes Absolute 0.5 0.1 - 1.0 K/uL   Eosinophils Relative 4 %   Eosinophils Absolute 0.3 0.0 - 0.7 K/uL   Basophils Relative 0 %   Basophils Absolute 0.0 0.0 - 0.1 K/uL   Laboratory interpretation all normal except hyperglycemia, mild anemia    EKG  EKG Interpretation  Date/Time:  Wednesday April 30 2016 23:24:51 EST Ventricular Rate:  79 PR Interval:    QRS Duration: 98 QT Interval:  388 QTC Calculation: 445 R Axis:   52 Text Interpretation:  Sinus rhythm Low voltage, precordial leads U waves present No significant change since last tracing 04 Dec 2015 Confirmed by Ladarius Seubert  MD-I, Katrisha Segall ([id number]) on 04/30/2016 11:56:41 PM       Radiology No results found.  Procedures Procedures (including critical care time)  Medications Ordered in ED Medications  gi cocktail (Maalox,Lidocaine,Donnatal) (30 mLs Oral Given 05/01/16 0020)  famotidine (PEPCID) tablet 20 mg (20 mg Oral Given 05/01/16 0020)     Initial Impression / Assessment and Plan / ED Course  I have reviewed the triage vital signs and the nursing notes.  Pertinent labs & imaging results that were available  during my care of the patient were reviewed by me and considered in my medical decision making (see chart for details).  COORDINATION OF CARE: 1:07 AM-Discussed next steps with pt. Pt verbalized understanding and is agreeable with the plan.   Pt was given a GI cocktail and oral pepcid.   At time of discharge her pain was gone. We discussed getting an outpatient [place] to look for gallstones, she wants to do it next week, so she can give her work a few days notice. She was given referrals to surgery if she does have gallstones, or if she does not have gallstone she was given referral to the gastroenterologist. Until then she was advised to avoid the foods that precipitate these attacks. Some of her symptoms sound like GERD, the epigastric discomfort with the burning, however she also has right upper quadrant pain when she eats certain foods. This would be consistent with gallstones.   Final Clinical Impressions(s) / ED Diagnoses   Final diagnoses:  Gastroesophageal reflux disease without esophagitis  RUQ pain    Plan discharge  Devoria Albe, MD, FACEP  I personally performed the services described in this documentation, which was scribed in my presence. The recorded information has been reviewed and considered.  Devoria Albe, MD, Concha Pyo, MD 05/01/16 [phone]

## 2016-05-01 NOTE — Discharge Instructions (Signed)
Avoid the foods you know will trigger these attacks. You are scheduled for an outpatient ultrasound of your gallbladder on February 28 at 4:15 PM. Arrive 15 minutes early and register in Radiology.  Do not eat or drink for at least 6 hrs before your test. If you have gallstones, call Dr Lovell SheehanJenkins' office (surgeon) to discuss having your gallbladder removed. If you don't have gallstones, call Dr Patty Sermonsehman's office (gastroenterologist) to discuss your food intolerances and abdominal pain. Return to the ED if you get a fever, or have uncontrolled vomiting or pain.

## 2016-05-07 ENCOUNTER — Ambulatory Visit (HOSPITAL_COMMUNITY)
Admission: RE | Admit: 2016-05-07 | Discharge: 2016-05-07 | Disposition: A | Discharge status: Home / Self Care | Payer: 59 | Source: Ambulatory Visit | Attending: Emergency Medicine | Admitting: Emergency Medicine

## 2016-05-07 ENCOUNTER — Other Ambulatory Visit (HOSPITAL_COMMUNITY): Payer: 59

## 2016-05-07 DIAGNOSIS — R932 Abnormal findings on diagnostic imaging of liver and biliary tract: Secondary | ICD-10-CM | POA: Insufficient documentation

## 2016-05-07 DIAGNOSIS — R1011 Right upper quadrant pain: Secondary | ICD-10-CM | POA: Insufficient documentation

## 2016-05-14 ENCOUNTER — Telehealth: Payer: 59 | Admitting: Family

## 2016-05-14 DIAGNOSIS — J019 Acute sinusitis, unspecified: Secondary | ICD-10-CM

## 2016-05-14 MED ORDER — DOXYCYCLINE HYCLATE 100 MG PO TABS
100.0000 mg | ORAL_TABLET | Freq: Two times a day (BID) | ORAL | 0 refills | Status: DC
Start: 2016-05-14 — End: 2016-06-07

## 2016-05-14 MED ORDER — BENZONATATE 100 MG PO CAPS: 100.0000 mg | ORAL_CAPSULE | Freq: Three times a day (TID) | ORAL | 0 refills | Status: DC | PRN

## 2016-05-14 NOTE — Progress Notes (Signed)
We are sorry that you are not feeling well.  Here is how we plan to help!  Based on what you have shared with me it looks like you have sinusitis.  Sinusitis is inflammation and infection in the sinus cavities of the head.  Based on your presentation I believe you most likely have Acute Bacterial Sinusitis.  This is an infection caused by bacteria and is treated with antibiotics. I have prescribed Doxycycline 100mg by mouth twice a day for 10 days. You may use an oral decongestant such as Mucinex D or if you have glaucoma or high blood pressure use plain Mucinex. Saline nasal spray help and can safely be used as often as needed for congestion.  If you develop worsening sinus pain, fever or notice severe headache and vision changes, or if symptoms are not better after completion of antibiotic, please schedule an appointment with a health care provider.    In addition you may use A non-prescription cough medication called Robitussin DAC. Take 2 teaspoons every 8 hours or Delsym: take 2 teaspoons every 12 hours., A non-prescription cough medication called Mucinex DM: take 2 tablets every 12 hours. and A prescription cough medication called Tessalon Perles 100mg. You may take 1-2 capsules every 8 hours as needed for your cough.  Sinus infections are not as easily transmitted as other respiratory infection, however we still recommend that you avoid close contact with loved ones, especially the very young and elderly.  Remember to wash your hands thoroughly throughout the day as this is the number one way to prevent the spread of infection!  Home Care:  Only take medications as instructed by your medical team.  Complete the entire course of an antibiotic.  Do not take these medications with alcohol.  A steam or ultrasonic humidifier can help congestion.  You can place a towel over your head and breathe in the steam from hot water coming from a faucet.  Avoid close contacts especially the very young and  the elderly.  Cover your mouth when you cough or sneeze.  Always remember to wash your hands.  Get Help Right Away If:  You develop worsening fever or sinus pain.  You develop a severe head ache or visual changes.  Your symptoms persist after you have completed your treatment plan.  Make sure you  Understand these instructions.  Will watch your condition.  Will get help right away if you are not doing well or get worse.  Your e-visit answers were reviewed by a board certified advanced clinical practitioner to complete your personal care plan.  Depending on the condition, your plan could have included both over the counter or prescription medications.  If there is a problem please reply  once you have received a response from your provider.  Your safety is important to us.  If you have drug allergies check your prescription carefully.    You can use MyChart to ask questions about today's visit, request a non-urgent call back, or ask for a work or school excuse for 24 hours related to this e-Visit. If it has been greater than 24 hours you will need to follow up with your provider, or enter a new e-Visit to address those concerns.  You will get an e-mail in the next two days asking about your experience.  I hope that your e-visit has been valuable and will speed your recovery. Thank you for using e-visits.     

## 2016-06-07 ENCOUNTER — Ambulatory Visit (INDEPENDENT_AMBULATORY_CARE_PROVIDER_SITE_OTHER): Payer: 59 | Admitting: Physician Assistant

## 2016-06-07 VITALS — BP 143/82 | HR 80 | Temp 97.8°F | Resp 16 | Ht 64.5 in | Wt 313.0 lb

## 2016-06-07 DIAGNOSIS — Z1329 Encounter for screening for other suspected endocrine disorder: Secondary | ICD-10-CM | POA: Diagnosis not present

## 2016-06-07 DIAGNOSIS — R1013 Epigastric pain: Secondary | ICD-10-CM | POA: Diagnosis not present

## 2016-06-07 DIAGNOSIS — K2100 Gastro-esophageal reflux disease with esophagitis, without bleeding: Secondary | ICD-10-CM

## 2016-06-07 DIAGNOSIS — Z131 Encounter for screening for diabetes mellitus: Secondary | ICD-10-CM | POA: Diagnosis not present

## 2016-06-07 LAB — POCT URINALYSIS DIP (MANUAL ENTRY)
BILIRUBIN UA: NEGATIVE
Bilirubin, UA: NEGATIVE
Glucose, UA: NEGATIVE
LEUKOCYTES UA: NEGATIVE
NITRITE UA: NEGATIVE
PH UA: 6 (ref 5.0–8.0)
PROTEIN UA: NEGATIVE
Spec Grav, UA: 1.01 (ref 1.030–1.035)
UROBILINOGEN UA: 0.2 (ref ?–2.0)

## 2016-06-07 LAB — POCT CBC
Granulocyte percent: 62.1 %G (ref 37–80)
HCT, POC: 36.7 % — AB (ref 37.7–47.9)
HEMOGLOBIN: 12.8 g/dL (ref 12.2–16.2)
Lymph, poc: 2.5 (ref 0.6–3.4)
MCH, POC: 30 pg (ref 27–31.2)
MCHC: 35 g/dL (ref 31.8–35.4)
MCV: 85.5 fL (ref 80–97)
MID (cbc): 0.5 (ref 0–0.9)
MPV: 7.6 fL (ref 0–99.8)
PLATELET COUNT, POC: 254 10*3/uL (ref 142–424)
POC Granulocyte: 5 (ref 2–6.9)
POC LYMPH %: 31.7 % (ref 10–50)
POC MID %: 6.2 %M (ref 0–12)
RBC: 4.29 M/uL (ref 4.04–5.48)
RDW, POC: 14.1 %
WBC: 8 10*3/uL (ref 4.6–10.2)

## 2016-06-07 LAB — POCT URINE PREGNANCY: PREG TEST UR: NEGATIVE

## 2016-06-07 MED ORDER — PANTOPRAZOLE SODIUM 40 MG PO TBEC: DELAYED_RELEASE_TABLET | ORAL | 3 refills | Status: DC

## 2016-06-07 NOTE — Patient Instructions (Signed)
     IF you received an x-ray today, you will receive an invoice from [facility]. Please contact [facility] at [phone] with questions or concerns regarding your invoice.   IF you received labwork today, you will receive an invoice from [facility]. Please contact [facility] at [phone] with questions or concerns regarding your invoice.   Our billing staff will not be able to assist you with questions regarding bills from these companies.  You will be contacted with the lab results as soon as they are available. The fastest way to get your results is to activate your My Chart account. Instructions are located on the last page of this paperwork. If you have not heard from us regarding the results in 2 weeks, please contact this office.     

## 2016-06-07 NOTE — Progress Notes (Signed)
06/07/2016 9:20 AM   DOB: 08-30-1975 / MRN: [id number]  SUBJECTIVE:  Monica Hernandez is a 41 y.o. female presenting for 6 months of "evil stomach." The pain is epigastic and she describes it as heart burn.  Has tried omeprazole which helped, but caused her to not urinate and has caused diarrhea. Myalanta and Zantac, Tagement, Pepcid did not work.  She was seen at [facility] ED around September for the pain where a heart workup was negative. Eating makes the pain worse. She does not smoke at this time and quit six years ago. She complain of pain with swallowing and has a constant sore throat, however she can swallow.   She has tried not eating fried foods and does not drink soda. Has a long history of sporadic GERD and had an ulcer in her 20's which was found on endoscopy along with a hiatal hernia. She stopped drinking and symptoms went away as long as she did not eat spicy foods.   She still has her gall bladder.   She is allergic to erythromycin; zithromax [azithromycin]; allegra-d [fexofenadine-pseudoephed er]; contrave [naltrexone-bupropion hcl er]; and sulfa antibiotics.   She  has a past medical history of Allergy; Anemia; Asthma; GERD (gastroesophageal reflux disease); Kidney stone; Mitral valve prolapse; and Ulcer ([place]).    She  reports that she has quit smoking. She has never used smokeless tobacco. She reports that she does not drink alcohol or use drugs. She  reports that she currently engages in sexual activity. She reports using the following method of birth control/protection: Surgical. The patient  has a past surgical history that includes Tubal ligation; Adenoidectomy, tonsillectomy and myringotomy with tube placement; Abdominal hysterectomy; Leg Surgery (Left); Cesarean section; and Fracture surgery.  Her family history includes Cancer in her father and mother; Diabetes in her maternal grandmother; Heart disease in her maternal grandmother; Hypertension in her mother and sister;  Stroke in her father and mother.  Review of Systems  Constitutional: Negative for chills, diaphoresis and fever.  Eyes: Negative.   Respiratory: Negative for shortness of breath.   Cardiovascular: Negative for chest pain, orthopnea and leg swelling.  Gastrointestinal: Positive for abdominal pain, diarrhea and heartburn. Negative for blood in stool, constipation, melena, nausea and vomiting.  Genitourinary: Negative for dysuria, flank pain, frequency, hematuria and urgency.  Neurological: Negative for dizziness, sensory change, speech change, focal weakness and headaches.    The problem list and medications were reviewed and updated by myself where necessary and exist elsewhere in the encounter.   OBJECTIVE:  BP (!) 143/82   Pulse 80   Temp 97.8 F (36.6 C) (Oral)   Resp 16   Ht 5' 4.5" (1.638 m)   Wt (!) 313 lb (142 kg)   SpO2 95%   BMI 52.90 kg/m   Physical Exam  Constitutional: She is oriented to person, place, and time.  Cardiovascular: Normal rate, regular rhythm, S1 normal and S2 normal.  Exam reveals no gallop, no friction rub and no decreased pulses.   No murmur heard. Pulmonary/Chest: Effort normal. No stridor. No respiratory distress. She has no wheezes. She has no rales.  Abdominal: Soft. Bowel sounds are normal. She exhibits no distension and no mass. There is tenderness (mild, epigastric). There is no rebound and no guarding.  Musculoskeletal: She exhibits no edema.  Neurological: She is alert and oriented to person, place, and time. She has normal strength and normal reflexes. She is not disoriented. She displays no atrophy and no tremor. No  cranial nerve deficit or sensory deficit. She exhibits normal muscle tone. She displays a negative Romberg sign. She displays no seizure activity. Coordination and gait normal.  Psychiatric: Her behavior is normal.    Lab Results  Component Value Date   CREATININE 0.79 05/01/2016   BUN 12 05/01/2016   NA 137 05/01/2016   K  4.2 05/01/2016   CL 105 05/01/2016   CO2 26 05/01/2016    Lab Results  Component Value Date   ALT 20 05/01/2016   AST 19 05/01/2016   ALKPHOS 65 05/01/2016   BILITOT 0.2 (L) 05/01/2016   Lab Results  Component Value Date   LIPASE 19 05/01/2016   Lab Results  Component Value Date   TROPONINI <0.03 05/01/2016   Lab Results  Component Value Date   DDIMER <0.27 12/04/2015   Lab Results  Component Value Date   WBC 8.0 06/07/2016   HGB 12.8 06/07/2016   HCT 36.7 (A) 06/07/2016   MCV 85.5 06/07/2016   PLT 226 05/01/2016     Last EKG NSR without signs of axis deviation, hypertrophy, ischemia or infarction on 05/01/16.   CLINICAL DATA:  Right upper quadrant pain.  EXAM: US ABDOMEN LIMITED - RIGHT UPPER QUADRANT  COMPARISON:  None.  FINDINGS: Gallbladder:  No gallstones. No gallstones. Gallbladder wall thickness normal. Negative Murphy sign.  Common bile duct:  Diameter: 3.6 mm  Liver:  There is echogenic consistent fatty infiltration and/or hepatocellular disease. No focal hepatic abnormality identified.  IMPRESSION: 1. Liver is echogenic consistent fatty infiltration and/or hepatocellular disease. No focal hepatic abnormality identified.  2. No gallstones or biliary distention.   Electronically Signed   By: [place]   On: 05/07/2016 16:18CLINICAL DATA:  Right upper quadrant pain.  EXAM: US ABDOMEN LIMITED - RIGHT UPPER QUADRANT COMPARISON:  None.  FINDINGS: Gallbladder:  No gallstones. No gallstones. Gallbladder wall thickness normal. Negative Murphy sign.  Common bile duct:  Diameter: 3.6 mm  Liver:  There is echogenic consistent fatty infiltration and/or hepatocellular disease. No focal hepatic abnormality identified.  IMPRESSION: 1. Liver is echogenic consistent fatty infiltration and/or hepatocellular disease. No focal hepatic abnormality identified.  2. No gallstones or biliary  distention.  Electronically Signed   By: Marcello Moores  Register   On: 05/07/2016 16:18   Results for orders placed or performed in visit on 06/07/16 (from the past 72 hour(s))  POCT urinalysis dipstick     Status: Abnormal   Collection Time: 06/07/16  8:57 AM  Result Value Ref Range   Color, UA yellow yellow   Clarity, UA clear clear   Glucose, UA negative negative   Bilirubin, UA negative negative   Ketones, POC UA negative negative   Spec Grav, UA 1.010 1.030 - 1.035   Blood, UA trace-lysed (A) negative   pH, UA 6.0 5.0 - 8.0   Protein Ur, POC negative negative   Urobilinogen, UA 0.2 Negative - 2.0   Nitrite, UA Negative Negative   Leukocytes, UA Negative Negative  POCT urine pregnancy     Status: None   Collection Time: 06/07/16  8:58 AM  Result Value Ref Range   Preg Test, Ur Negative Negative  POCT CBC     Status: Abnormal   Collection Time: 06/07/16  9:13 AM  Result Value Ref Range   WBC 8.0 4.6 - 10.2 K/uL   Lymph, poc 2.5 0.6 - 3.4   POC LYMPH PERCENT 31.7 10 - 50 %L   MID (cbc) 0.5 0 - 0.9  POC MID % 6.2 0 - 12 %M   POC Granulocyte 5.0 2 - 6.9   Granulocyte percent 62.1 37 - 80 %G   RBC 4.29 4.04 - 5.48 M/uL   Hemoglobin 12.8 12.2 - 16.2 g/dL   HCT, POC 36.7 (A) 37.7 - 47.9 %   MCV 85.5 80 - 97 fL   MCH, POC 30.0 27 - 31.2 pg   MCHC 35.0 31.8 - 35.4 g/dL   RDW, POC 14.1 %   Platelet Count, POC 254 142 - 424 K/uL   MPV 7.6 0 - 99.8 fL    No results found.  ASSESSMENT AND PLAN:  Shaquasia was seen today for abdominal pain and heartburn.  Diagnoses and all orders for this visit:  Gastroesophageal reflux disease with esophagitis: Previous cards workup negative and she is not having any of those symptoms today.  Will restart GERD therapy with ppi and refer to GI given odynophagia.  Hopefully her H. Pylori will come back positive for I can help her faster.  -     H. pylori breath test -     Lipid panel -     POCT CBC -     CMP14+EGFR -     pantoprazole  (PROTONIX) 40 MG tablet; Take thirty minutes on an empty stomach twice daily for two weeks, then in the morning only. Always eat thirty minutes after taking. -     Ambulatory referral to Gastroenterology  Screening for thyroid disorder -     TSH  Screening for diabetes mellitus -     Hemoglobin A1c  Epigastric pain -     POCT urinalysis dipstick -     POCT urine pregnancy    The patient is advised to call or return to clinic if she does not see an improvement in symptoms, or to seek the care of the closest emergency department if she worsens with the above plan.   Philis Fendt, MHS, PA-C [facility] 06/07/2016 9:20 AM

## 2016-06-08 LAB — TSH: TSH: 2.03 u[IU]/mL (ref 0.450–4.500)

## 2016-06-08 LAB — CMP14+EGFR
A/G RATIO: 1.6 (ref 1.2–2.2)
ALT: 17 IU/L (ref 0–32)
AST: 15 IU/L (ref 0–40)
Albumin: 4.4 g/dL (ref 3.5–5.5)
Alkaline Phosphatase: 80 IU/L (ref 39–117)
BILIRUBIN TOTAL: 0.3 mg/dL (ref 0.0–1.2)
BUN / CREAT RATIO: 14 (ref 9–23)
BUN: 12 mg/dL (ref 6–24)
CALCIUM: 9.4 mg/dL (ref 8.7–10.2)
CHLORIDE: 99 mmol/L (ref 96–106)
CO2: 21 mmol/L (ref 18–29)
Creatinine, Ser: 0.88 mg/dL (ref 0.57–1.00)
GFR, EST AFRICAN AMERICAN: 94 mL/min/{1.73_m2} (ref >59)
GFR, EST NON AFRICAN AMERICAN: 81 mL/min/{1.73_m2} (ref >59)
GLOBULIN, TOTAL: 2.8 g/dL (ref 1.5–4.5)
Glucose: 92 mg/dL (ref 65–99)
POTASSIUM: 4.7 mmol/L (ref 3.5–5.2)
SODIUM: 140 mmol/L (ref 134–144)
TOTAL PROTEIN: 7.2 g/dL (ref 6.0–8.5)

## 2016-06-08 LAB — HEMOGLOBIN A1C
ESTIMATED AVERAGE GLUCOSE: 126 mg/dL
Hgb A1c MFr Bld: 6 % — ABNORMAL HIGH (ref 4.8–5.6)

## 2016-06-08 LAB — LIPID PANEL
CHOL/HDL RATIO: 3.5 ratio (ref 0.0–4.4)
CHOLESTEROL TOTAL: 203 mg/dL — AB (ref 100–199)
HDL: 58 mg/dL (ref >39)
LDL CALC: 122 mg/dL — AB (ref 0–99)
TRIGLYCERIDES: 117 mg/dL (ref 0–149)
VLDL CHOLESTEROL CAL: 23 mg/dL (ref 5–40)

## 2016-06-10 LAB — H. PYLORI BREATH TEST: H. PYLORI UBIT: NEGATIVE

## 2016-06-14 ENCOUNTER — Ambulatory Visit (INDEPENDENT_AMBULATORY_CARE_PROVIDER_SITE_OTHER): Payer: 59 | Admitting: Physician Assistant

## 2016-06-14 VITALS — BP 139/81 | HR 88 | Temp 98.5°F | Resp 17 | Ht 64.5 in | Wt 313.0 lb

## 2016-06-14 DIAGNOSIS — K219 Gastro-esophageal reflux disease without esophagitis: Secondary | ICD-10-CM

## 2016-06-14 MED ORDER — SUCRALFATE 1 GM/10ML PO SUSP: 1.0000 g | Freq: Three times a day (TID) | ORAL | 0 refills | Status: DC

## 2016-06-14 NOTE — Patient Instructions (Addendum)
  Please go ahead and set up your appointment with Gastroenterology.   We should not use this more than 6 weeks.  I would like you to review the reflux diet below.   You may try taking ranitidine  twice per day with meals as well.  Food Choices for Gastroesophageal Reflux Disease, Adult When you have gastroesophageal reflux disease (GERD), the foods you eat and your eating habits are very important. Choosing the right foods can help ease your discomfort. What guidelines do I need to follow?  Choose fruits, vegetables, whole grains, and low-fat dairy products.  Choose low-fat meat, fish, and poultry.  Limit fats such as oils, salad dressings, butter, nuts, and avocado.  Keep a food diary. This helps you identify foods that cause symptoms.  Avoid foods that cause symptoms. These may be different for everyone.  Eat small meals often instead of 3 large meals a day.  Eat your meals slowly, in a place where you are relaxed.  Limit fried foods.  Cook foods using methods other than frying.  Avoid drinking alcohol.  Avoid drinking large amounts of liquids with your meals.  Avoid bending over or lying down until 2-3 hours after eating. What foods are not recommended? These are some foods and drinks that may make your symptoms worse: Vegetables  Tomatoes. Tomato juice. Tomato and spaghetti sauce. Chili peppers. Onion and garlic. Horseradish. Fruits  Oranges, grapefruit, and lemon (fruit and juice). Meats  High-fat meats, fish, and poultry. This includes hot dogs, ribs, ham, sausage, salami, and bacon. Dairy  Whole milk and chocolate milk. Sour cream. Cream. Butter. Ice cream. Cream cheese. Drinks  Coffee and tea. Bubbly (carbonated) drinks or energy drinks. Condiments  Hot sauce. Barbecue sauce. Sweets/Desserts  Chocolate and cocoa. Donuts. Peppermint and spearmint. Fats and Oils  High-fat foods. This includes [place] fries and potato chips. Other  Vinegar. Strong spices.  This includes black pepper, white pepper, red pepper, cayenne, curry powder, cloves, ginger, and chili powder. The items listed above may not be a complete list of foods and drinks to avoid. Contact your dietitian for more information.  This information is not intended to replace advice given to you by your health care provider. Make sure you discuss any questions you have with your health care provider. Document Released: 08/26/2011 Document Revised: 08/02/2015 Document Reviewed: 12/29/2012 Elsevier Interactive Patient Education  2017 [employer].    IF you received an x-ray today, you will receive an invoice from [facility]. Please contact [facility] at [phone] with questions or concerns regarding your invoice.   IF you received labwork today, you will receive an invoice from [facility]. Please contact [facility] at [phone] with questions or concerns regarding your invoice.   Our billing staff will not be able to assist you with questions regarding bills from these companies.  You will be contacted with the lab results as soon as they are available. The fastest way to get your results is to activate your My Chart account. Instructions are located on the last page of this paperwork. If you have not heard from [place] regarding the results in 2 weeks, please contact this office.

## 2016-06-14 NOTE — Progress Notes (Signed)
PRIMARY CARE AT [facility] [address], [place] [address] [phone]  Date:  06/14/2016   Name:  Monica Hernandez   DOB:  [birth year]   MRN:  [id number]  PCP:  No PCP Per Patient    History of Present Illness:  Monica Hernandez is a 41 y.o. female patient who presents to PCP with  Chief Complaint  Patient presents with  . Follow-up    1 wk f/u-stomach issues     Patient reports palpitations and headaches, and wheezing when she is taking the Protonix.  This was similar with the omeprazole.   Placed on Protonix one week ago following visit to treat reflux.  She was advised to follow up with GI which she reports that she has not done yet.  The Protonix resolved her symptoms, and she reports no diarrhea or abdominal pain.  She has also noticed hands and feet swelling, and severe headaches with using these meds, and resolving when discontinued.     She has used her inhaler twice this week.  No fever or cough.   She is only taking 1 tablet of the 40 mg Protonix daily.  She will also take a benadryl which dulls her symptoms.  She reports no improvement with the therapeutic dosage of ranitidine, Pepcid.    There are no active problems to display for this patient.   Past Medical History:  Diagnosis Date  . Allergy   . Anemia   . Asthma   . GERD (gastroesophageal reflux disease)   . Kidney stone   . Mitral valve prolapse   . Ulcer [facility])     Past Surgical History:  Procedure Laterality Date  . ABDOMINAL HYSTERECTOMY    . ADENOIDECTOMY, TONSILLECTOMY AND MYRINGOTOMY WITH TUBE PLACEMENT    . CESAREAN SECTION    . FRACTURE SURGERY    . LEG SURGERY Left   . TUBAL LIGATION      Social History  Substance Use Topics  . Smoking status: Former Games developer  . Smokeless tobacco: Never Used  . Alcohol use No    Family History  Problem Relation Age of Onset  . Cancer Mother   . Hypertension Mother   . Stroke Mother   . Cancer Father   . Stroke Father   . Hypertension Sister   .  Heart disease Maternal Grandmother   . Diabetes Maternal Grandmother     Allergies  Allergen Reactions  . Erythromycin   . Zithromax [Azithromycin]   . Allegra-D [Fexofenadine-Pseudoephed Er]   . Contrave [Naltrexone-Bupropion Hcl Er]   . Sulfa Antibiotics Rash    Medication list has been reviewed and updated.  Current Outpatient Prescriptions on File Prior to Visit  Medication Sig Dispense Refill  . albuterol (PROVENTIL HFA;VENTOLIN HFA) 108 (90 Base) MCG/ACT inhaler Inhale 2 puffs into the lungs every 4 (four) hours as needed for wheezing or shortness of breath. 1 Inhaler 0  . Calcium & Magnesium Carbonates (MYLANTA PO) Take by mouth.    . pantoprazole (PROTONIX) 40 MG tablet Take thirty minutes on an empty stomach twice daily for two weeks, then in the morning only. Always eat thirty minutes after taking. 90 tablet 3   No current facility-administered medications on file prior to visit.     ROS ROS otherwise unremarkable unless listed above.  Physical Examination: BP 139/81   Pulse 88   Temp 98.5 F (36.9 C) (Oral)   Resp 17   Ht 5' 4.5" (1.638 m)   Monica Hernandez Kitchen)  313 lb (142 kg)   SpO2 97%   BMI 52.90 kg/m  Ideal Body Weight: Weight in (lb) to have BMI = 25: 147.6  Physical Exam  Constitutional: She is oriented to person, place, and time. She appears well-developed and well-nourished. No distress.  HENT:  Head: Normocephalic and atraumatic.  Right Ear: External ear normal.  Left Ear: External ear normal.  Eyes: Conjunctivae and EOM are normal. Pupils are equal, round, and reactive to light.  Cardiovascular: Normal rate.   Pulmonary/Chest: Effort normal. No respiratory distress.  Abdominal: Soft. Normal appearance and bowel sounds are normal.  Neurological: She is alert and oriented to person, place, and time.  Skin: She is not diaphoretic.  Psychiatric: She has a normal mood and affect. Her behavior is normal.     Assessment and Plan: Monica Hernandez is a 41  y.o. female who is here today for follow up. Due to se, we will attempt Carafate.  Advised for her to also take the ranitidine, and given the GERD diet.   Advised to follow up with gastroenterology.   Gastroesophageal reflux disease, esophagitis presence not specified - Plan: sucralfate (CARAFATE) 1 GM/10ML suspension  Trena Platt, PA-C [facility] 4/8/20189:02 PM

## 2016-06-17 ENCOUNTER — Encounter: Payer: Self-pay | Admitting: Gastroenterology

## 2016-07-16 ENCOUNTER — Ambulatory Visit (INDEPENDENT_AMBULATORY_CARE_PROVIDER_SITE_OTHER): Payer: 59 | Admitting: Gastroenterology

## 2016-07-16 ENCOUNTER — Encounter: Payer: Self-pay | Admitting: Gastroenterology

## 2016-07-16 VITALS — BP 126/80 | HR 98 | Ht 64.0 in | Wt 317.0 lb

## 2016-07-16 DIAGNOSIS — K219 Gastro-esophageal reflux disease without esophagitis: Secondary | ICD-10-CM | POA: Diagnosis not present

## 2016-07-16 DIAGNOSIS — R1011 Right upper quadrant pain: Secondary | ICD-10-CM

## 2016-07-16 DIAGNOSIS — R1032 Left lower quadrant pain: Secondary | ICD-10-CM | POA: Diagnosis not present

## 2016-07-16 MED ORDER — DICYCLOMINE HCL 10 MG PO CAPS: 10.0000 mg | ORAL_CAPSULE | Freq: Three times a day (TID) | ORAL | 11 refills | Status: DC

## 2016-07-16 MED ORDER — LANSOPRAZOLE 30 MG PO CPDR: 30.0000 mg | DELAYED_RELEASE_CAPSULE | Freq: Every day | ORAL | 11 refills | Status: DC

## 2016-07-16 NOTE — Patient Instructions (Signed)
We have sent the following medications to your pharmacy for you to pick up at your convenience: Prevacid and dicyclomine.  Take Tums for breakthrough reflux symptoms and not Maalox or Mylanta.   Normal BMI (Body Mass Index- based on height and weight) is between 19 and 25. Your BMI today is Body mass index is 54.41 kg/m. Marland Kitchen. Please consider follow up  regarding your BMI with your Primary Care Provider.  Thank you for choosing me and [facility].  Venita LickMalcolm T. Pleas KochStark, Jr., MD., Clementeen GrahamFACG

## 2016-07-16 NOTE — Progress Notes (Signed)
History of Present Illness: This is a 41 year old female referred by Ofilia Neas, PA-C for the evaluation of GERD and abdominal pain for 6 months. She relates heartburn and regurgitation occurring mainly at night but also occasionally after meals. She also relates sharp, shooting right upper quadrant and left lower quadrant pains that frequently occur after large meals. Pain can last for a few minutes to a few hours. She relates no change in bowel movements with the symptoms. She relates problems with diarrhea when she was taking Pepcid, Protonix, omeprazole and Mylanta. She felt Protonix and Mylanta were effective in controlling her reflux symptoms and the other medications were not helpful. Protonix also helped her abdominal pain. CMP, CBC, lipase, TSH normal. Denies weight loss, constipation, change in stool caliber, melena, hematochezia, nausea, vomiting, dysphagia, chest pain.  H pylori breath test 05/2016:  Negative  RUQ [place] 04/2016 IMPRESSION: 1. Liver is echogenic consistent fatty infiltration and/or hepatocellular disease. No focal hepatic abnormality identified. 2. No gallstones or biliary distention.   Allergies  Allergen Reactions  . Erythromycin   . Zithromax [Azithromycin]   . Allegra-D [Fexofenadine-Pseudoephed Er]   . Contrave [Naltrexone-Bupropion Hcl Er]   . Sulfa Antibiotics Rash   Outpatient Medications Prior to Visit  Medication Sig Dispense Refill  . albuterol (PROVENTIL HFA;VENTOLIN HFA) 108 (90 Base) MCG/ACT inhaler Inhale 2 puffs into the lungs every 4 (four) hours as needed for wheezing or shortness of breath. 1 Inhaler 0  . Calcium & Magnesium Carbonates (MYLANTA PO) Take by mouth.    . pantoprazole (PROTONIX) 40 MG tablet Take thirty minutes on an empty stomach twice daily for two weeks, then in the morning only. Always eat thirty minutes after taking. 90 tablet 3  . sucralfate (CARAFATE) 1 GM/10ML suspension Take 10 mLs (1 g total) by mouth 4 (four) times  daily -  with meals and at bedtime. 420 mL 0   No facility-administered medications prior to visit.    Past Medical History:  Diagnosis Date  . Allergy   . Anemia   . Asthma   . GERD (gastroesophageal reflux disease)   . Kidney stone   . Mitral valve prolapse   . Ulcer    Past Surgical History:  Procedure Laterality Date  . ABDOMINAL HYSTERECTOMY    . ADENOIDECTOMY, TONSILLECTOMY AND MYRINGOTOMY WITH TUBE PLACEMENT    . CESAREAN SECTION    . FRACTURE SURGERY    . LEG SURGERY Left   . TUBAL LIGATION     Social History   Social History  . Marital status: Married    Spouse name: N/A  . Number of children: N/A  . Years of education: N/A   Occupational History  .  [employer]    heart care   Social History Main Topics  . Smoking status: Former Games developer  . Smokeless tobacco: Never Used  . Alcohol use No  . Drug use: No  . Sexual activity: Yes    Partners: Male    Birth control/ protection: Surgical   Other Topics Concern  . None   Social History Narrative  . None   Family History  Problem Relation Age of Onset  . Hypertension Mother   . Stroke Mother   . Thyroid cancer Mother   . Stroke Father   . Lung cancer Father   . Brain cancer Father   . Bone cancer Father   . Hypertension Sister   . Heart disease Maternal Grandmother   .  Diabetes Maternal Grandmother   . Stomach cancer Neg Hx   . Colon cancer Neg Hx       Review of Systems: Pertinent positive and negative review of systems were noted in the above HPI section. All other review of systems were otherwise negative.    Physical Exam: General: Well developed, well nourished, obese, no acute distress Head: Normocephalic and atraumatic Eyes:  sclerae anicteric, EOMI Ears: Normal auditory acuity Mouth: No deformity or lesions Neck: Supple, no masses or thyromegaly Lungs: Clear throughout to auscultation Heart: Regular rate and rhythm; no murmurs, rubs or bruits Abdomen: Soft, non tender and non  distended. No masses, hepatosplenomegaly or hernias noted. Normal Bowel sounds Rectal: not done Musculoskeletal: Symmetrical with no gross deformities  Skin: No lesions on visible extremities Pulses:  Normal pulses noted Extremities: No clubbing, cyanosis, edema or deformities noted Neurological: Alert oriented x 4, grossly nonfocal Cervical Nodes:  No significant cervical adenopathy Inguinal Nodes: No significant inguinal adenopathy Psychological:  Alert and cooperative. Normal mood and affect  Assessment and Recommendations:  1. GERD. Possible intolerances to omeprazole and Protonix leading to diarrhea however she was taking Mylanta frequently at times and this may be the cause of diarrhea. Follow all standard antireflux measures. Trial of Prevacid 30 mg daily. Patient is advised to use Tums and avoid other over-the-counter antacids. Consider EGD if symptoms not substantially improved. REV in 6 weeks.   2. Postprandial right upper quadrant and left lower quadrant abdominal pain. Etiology unclear. Trial of dicyclomine 10 mg 3 times a day before meals. If symptoms persist proceed with abdominal/pelvic CT. REV in 6 weeks.  3. Fatty liver. Morbid obesity. Long-term low fat, carb modified, weight loss diet to target at normal BMI supervised by PCP.   cc: Ofilia Neaslark, Michael L, PA-C [address] [place], [place] [id number]

## 2016-08-04 ENCOUNTER — Emergency Department (HOSPITAL_COMMUNITY)
Admission: EM | Admit: 2016-08-04 | Discharge: 2016-08-04 | Disposition: A | Discharge status: Home / Self Care | Payer: 59

## 2016-08-05 ENCOUNTER — Ambulatory Visit (INDEPENDENT_AMBULATORY_CARE_PROVIDER_SITE_OTHER): Payer: 59 | Admitting: Physician Assistant

## 2016-08-05 ENCOUNTER — Encounter: Payer: Self-pay | Admitting: Physician Assistant

## 2016-08-05 ENCOUNTER — Ambulatory Visit (INDEPENDENT_AMBULATORY_CARE_PROVIDER_SITE_OTHER): Payer: 59

## 2016-08-05 VITALS — BP 146/81 | HR 69 | Temp 98.8°F | Resp 18 | Ht 64.5 in | Wt 302.4 lb

## 2016-08-05 DIAGNOSIS — R609 Edema, unspecified: Secondary | ICD-10-CM | POA: Diagnosis not present

## 2016-08-05 DIAGNOSIS — R0601 Orthopnea: Secondary | ICD-10-CM | POA: Diagnosis not present

## 2016-08-05 LAB — POCT CBC
GRANULOCYTE PERCENT: 60 % (ref 37–80)
HEMATOCRIT: 35.3 % — AB (ref 37.7–47.9)
Hemoglobin: 12.5 g/dL (ref 12.2–16.2)
Lymph, poc: 3.3 (ref 0.6–3.4)
MCH: 29.9 pg (ref 27–31.2)
MCHC: 35.3 g/dL (ref 31.8–35.4)
MCV: 84.8 fL (ref 80–97)
MID (CBC): 0.7 (ref 0–0.9)
MPV: 7.3 fL (ref 0–99.8)
POC GRANULOCYTE: 6 (ref 2–6.9)
POC LYMPH %: 32.6 % (ref 10–50)
POC MID %: 7.4 %M (ref 0–12)
Platelet Count, POC: 270 10*3/uL (ref 142–424)
RBC: 4.16 M/uL (ref 4.04–5.48)
RDW, POC: 14.5 %
WBC: 10 10*3/uL (ref 4.6–10.2)

## 2016-08-05 LAB — POCT URINALYSIS DIP (MANUAL ENTRY)
BILIRUBIN UA: NEGATIVE
GLUCOSE UA: NEGATIVE mg/dL
Ketones, POC UA: NEGATIVE mg/dL
Leukocytes, UA: NEGATIVE
Nitrite, UA: NEGATIVE
Protein Ur, POC: NEGATIVE mg/dL
Spec Grav, UA: <=1.005 — AB (ref 1.010–1.025)
UROBILINOGEN UA: 0.2 U/dL
pH, UA: 5.5 (ref 5.0–8.0)

## 2016-08-05 MED ORDER — FUROSEMIDE 20 MG PO TABS: 20.0000 mg | ORAL_TABLET | Freq: Every day | ORAL | 3 refills | Status: DC

## 2016-08-05 NOTE — Progress Notes (Signed)
08/06/2016 9:53 AM   DOB: 02/27/1976 / MRN: [id number]  SUBJECTIVE:  Monica Hernandez is a 41 y.o. female presenting for swelling of the hands and feet.  This started months ago. She feels this started as a result of prevacid administration. She has had this reaction to PPIs in the past and tells me that this does happen every time, "eventually" when taking PPIs.  She denies chest pain, SOB, DOE.  Does complain that she has to sleep in the inclined position in order to breath well and this is relieved by her albuterol inhaler. Most recent EKG in late 2017 and normal.   She is allergic to erythromycin; zithromax [azithromycin]; allegra-d [fexofenadine-pseudoephed er]; contrave [naltrexone-bupropion hcl er]; and sulfa antibiotics.   She  has a past medical history of Allergy; Anemia; Asthma; GERD (gastroesophageal reflux disease); Kidney stone; Mitral valve prolapse; and Ulcer.    She  reports that she has quit smoking. She has never used smokeless tobacco. She reports that she does not drink alcohol or use drugs. She  reports that she currently engages in sexual activity and has had female partners. She reports using the following method of birth control/protection: Surgical. The patient  has a past surgical history that includes Tubal ligation; Adenoidectomy, tonsillectomy and myringotomy with tube placement; Abdominal hysterectomy; Leg Surgery (Left); Cesarean section; and Fracture surgery.  Her family history includes Bone cancer in her father; Brain cancer in her father; Diabetes in her maternal grandmother; Heart disease in her maternal grandmother; Hypertension in her mother and sister; Lung cancer in her father; Stroke in her father and mother; Thyroid cancer in her mother.  Review of Systems  Constitutional: Negative for chills and fever.  Cardiovascular: Negative for chest pain.  Gastrointestinal: Negative for abdominal pain, blood in stool, constipation, diarrhea, heartburn, melena, nausea  and vomiting.  Skin: Negative for rash.    The problem list and medications were reviewed and updated by myself where necessary and exist elsewhere in the encounter.   OBJECTIVE:  BP (!) 146/81 (BP Location: Right Wrist, Patient Position: Sitting, Cuff Size: Large)   Pulse 69   Temp 98.8 F (37.1 C) (Oral)   Resp 18   Ht 5' 4.5" (1.638 m)   Wt (!) 302 lb 6.4 oz (137.2 kg)   SpO2 98%   BMI 51.11 kg/m   Lab Results  Component Value Date   HGBA1C 6.0 (H) 06/07/2016   Physical Exam  Constitutional: She is oriented to person, place, and time. She appears well-developed and well-nourished.  Cardiovascular: Normal rate and regular rhythm.   Pulmonary/Chest: Effort normal and breath sounds normal.  Abdominal: Soft. Bowel sounds are normal. She exhibits no distension. There is no tenderness. There is no rebound and no guarding.  Musculoskeletal: She exhibits edema. She exhibits no tenderness or deformity.  Neurological: She is alert and oriented to person, place, and time.  Skin: Skin is warm and dry.  Psychiatric: She has a normal mood and affect.    Wt Readings from Last 3 Encounters:  08/05/16 (!) 302 lb 6.4 oz (137.2 kg)  07/16/16 (!) 317 lb (143.8 kg)  06/14/16 (!) 313 lb (142 kg)     Results for orders placed or performed in visit on 08/05/16 (from the past 72 hour(s))  Basic metabolic panel     Status: None   Collection Time: 08/05/16  6:13 PM  Result Value Ref Range   Glucose 99 65 - 99 mg/dL   BUN 9 6 - 24 mg/dL  Creatinine, Ser 0.75 0.57 - 1.00 mg/dL   GFR calc non Af Amer 99 >59 mL/min/1.73   GFR calc Af Amer 114 >59 mL/min/1.73   BUN/Creatinine Ratio 12 9 - 23   Sodium 138 134 - 144 mmol/L   Potassium 4.2 3.5 - 5.2 mmol/L   Chloride 101 96 - 106 mmol/L   CO2 23 18 - 29 mmol/L    Comment: **Effective August 18, 2016 Carbon Dioxide, Total**   reference interval will be changing to:              Age                  Female          Female      0 days   - 30 days          16 - 29        16 - 29     31 days   -  1 year         15 - 25        15 - 25      2 years  -  5 years        17 - 78        67 - 92      6 years  - 12 years        61 - 57        69 - 36                >12 years        20 - 31        20 - 29    Calcium 9.6 8.7 - 10.2 mg/dL  Albumin     Status: None   Collection Time: 08/05/16  6:13 PM  Result Value Ref Range   Albumin 4.2 3.5 - 5.5 g/dL  POCT urinalysis dipstick     Status: Abnormal   Collection Time: 08/05/16  6:23 PM  Result Value Ref Range   Color, UA yellow yellow   Clarity, UA clear clear   Glucose, UA negative negative mg/dL   Bilirubin, UA negative negative   Ketones, POC UA negative negative mg/dL   Spec Grav, UA <=1.610 (A) 1.010 - 1.025   Blood, UA trace-lysed (A) negative   pH, UA 5.5 5.0 - 8.0   Protein Ur, POC negative negative mg/dL   Urobilinogen, UA 0.2 0.2 or 1.0 E.U./dL   Nitrite, UA Negative Negative   Leukocytes, UA Negative Negative  POCT CBC     Status: Abnormal   Collection Time: 08/05/16  6:51 PM  Result Value Ref Range   WBC 10.0 4.6 - 10.2 K/uL   Lymph, poc 3.3 0.6 - 3.4   POC LYMPH PERCENT 32.6 10 - 50 %L   MID (cbc) 0.7 0 - 0.9   POC MID % 7.4 0 - 12 %M   POC Granulocyte 6.0 2 - 6.9   Granulocyte percent 60.0 37 - 80 %G   RBC 4.16 4.04 - 5.48 M/uL   Hemoglobin 12.5 12.2 - 16.2 g/dL   HCT, POC 96.0 (A) 45.4 - 47.9 %   MCV 84.8 80 - 97 fL   MCH, POC 29.9 27 - 31.2 pg   MCHC 35.3 31.8 - 35.4 g/dL   RDW, POC 09.8 %   Platelet Count, POC 270 142 - 424 K/uL   MPV 7.3 0 - 99.8 fL  Dg Chest 2 View  Result Date: 08/05/2016 CLINICAL DATA:  Initial evaluation for acute leg swelling with orthopnea. EXAM: CHEST  2 VIEW COMPARISON:  Prior radiograph from 12/05/2015. FINDINGS: The cardiac and mediastinal silhouettes are stable in size and contour, and remain within normal limits. The lungs are normally inflated. No airspace consolidation, pleural effusion, or pulmonary edema is identified.  There is no pneumothorax. No acute osseous abnormality identified. IMPRESSION: No active cardiopulmonary disease. Electronically Signed   By: Rise MuBenjamin  McClintock M.D.   On: 08/05/2016 18:56   ASSESSMENT AND PLAN:  Nelva Bushorma was seen today for edema.  Diagnoses and all orders for this visit:  Swelling: No history of CHF with normal ekg in late 2017.  No history of diabetes, HTN, and CKD. This may in fact be secondary to PPI pending cardiac echo.  Will treat with lasix per sig for now as a PPI is necessary for her to remain functional given history of terribly symptomatic gerd.  -     POCT urinalysis dipstick -     Basic metabolic panel -     Albumin -     DG Chest 2 View; Future -     Cancel: CBC -     Brain natriuretic peptide -     ECHOCARDIOGRAM COMPLETE; Future -     furosemide (LASIX) 20 MG tablet; Take 1 tablet (20 mg total) by mouth daily. Do not take more than one tab daily and do not take more than 3 tabs weekly . -     POCT CBC    The patient is advised to call or return to clinic if she does not see an improvement in symptoms, or to seek the care of the closest emergency department if she worsens with the above plan.   Deliah BostonMichael Kaileia Flow, MHS, PA-C [facility] 08/06/2016 9:53 AM

## 2016-08-05 NOTE — Patient Instructions (Signed)
     IF you received an x-ray today, you will receive an invoice from [facility]. Please contact [facility] at [phone] with questions or concerns regarding your invoice.   IF you received labwork today, you will receive an invoice from [facility]. Please contact [facility] at [phone] with questions or concerns regarding your invoice.   Our billing staff will not be able to assist you with questions regarding bills from these companies.  You will be contacted with the lab results as soon as they are available. The fastest way to get your results is to activate your My Chart account. Instructions are located on the last page of this paperwork. If you have not heard from us regarding the results in 2 weeks, please contact this office.     

## 2016-08-06 ENCOUNTER — Other Ambulatory Visit: Payer: Self-pay | Admitting: Physician Assistant

## 2016-08-06 ENCOUNTER — Telehealth: Payer: Self-pay

## 2016-08-06 DIAGNOSIS — R609 Edema, unspecified: Secondary | ICD-10-CM

## 2016-08-06 LAB — BASIC METABOLIC PANEL
BUN/Creatinine Ratio: 12 (ref 9–23)
BUN: 9 mg/dL (ref 6–24)
CALCIUM: 9.6 mg/dL (ref 8.7–10.2)
CO2: 23 mmol/L (ref 18–29)
CREATININE: 0.75 mg/dL (ref 0.57–1.00)
Chloride: 101 mmol/L (ref 96–106)
GFR, EST AFRICAN AMERICAN: 114 mL/min/{1.73_m2} (ref >59)
GFR, EST NON AFRICAN AMERICAN: 99 mL/min/{1.73_m2} (ref >59)
Glucose: 99 mg/dL (ref 65–99)
Potassium: 4.2 mmol/L (ref 3.5–5.2)
Sodium: 138 mmol/L (ref 134–144)

## 2016-08-06 LAB — BRAIN NATRIURETIC PEPTIDE: BNP: 7.6 pg/mL (ref 0.0–100.0)

## 2016-08-06 LAB — ALBUMIN: Albumin: 4.2 g/dL (ref 3.5–5.5)

## 2016-08-06 MED ORDER — FUROSEMIDE 20 MG PO TABS: ORAL_TABLET | ORAL | 3 refills | Status: DC

## 2016-08-06 NOTE — Telephone Encounter (Signed)
Please clarify directions on furosemide And resend rx

## 2016-08-06 NOTE — Telephone Encounter (Signed)
I have sent in the new med. Sorry for the confusion.

## 2016-08-15 MED FILL — LANSOPRAZOLE DR 30 MG CAP: 30 | 30 days supply | Qty: 30 | Fill #0

## 2016-08-25 ENCOUNTER — Ambulatory Visit (HOSPITAL_COMMUNITY)
Admission: RE | Admit: 2016-08-25 | Discharge: 2016-08-25 | Disposition: A | Discharge status: Home / Self Care | Payer: 59 | Source: Ambulatory Visit | Attending: Physician Assistant | Admitting: Physician Assistant

## 2016-08-25 DIAGNOSIS — R609 Edema, unspecified: Secondary | ICD-10-CM | POA: Diagnosis not present

## 2016-08-25 LAB — ECHOCARDIOGRAM COMPLETE
E decel time: 208 msec
E/e' ratio: 6.54
FS: 37 % (ref 28–44)
IVS/LV PW RATIO, ED: 0.96
LA ID, A-P, ES: 42 mm
LA diam end sys: 42 mm
LA diam index: 1.63 cm/m2
LA vol A4C: 52.1 ml
LV E/e' medial: 6.54
LV E/e'average: 6.54
LV PW d: 11 mm — AB (ref 0.6–1.1)
LV dias vol index: 31 mL/m2
LV dias vol: 80 mL (ref 46–106)
LV e' LATERAL: 9.57 cm/s
LV sys vol index: 10 mL/m2
LV sys vol: 26 mL (ref 14–42)
LVOT SV: 76 mL
LVOT VTI: 22 cm
LVOT area: 3.46 cm2
LVOT diameter: 21 mm
LVOT peak grad rest: 5 mmHg
LVOT peak vel: 108 cm/s
Lateral S' vel: 11.3 cm/s
MV Dec: 208
MV pk A vel: 64.3 m/s
MV pk E vel: 62.6 m/s
Simpson's disk: 68
Stroke v: 54 ml
TAPSE: 18.9 mm
TDI e' lateral: 9.57
TDI e' medial: 7.83

## 2016-08-25 NOTE — Progress Notes (Signed)
*  PRELIMINARY RESULTS* Echocardiogram 2D Echocardiogram has been performed.  Stacey DrainWhite, Aarian Griffie J 08/25/2016, 9:19 AM

## 2016-08-27 ENCOUNTER — Ambulatory Visit (INDEPENDENT_AMBULATORY_CARE_PROVIDER_SITE_OTHER): Payer: 59 | Admitting: Physician Assistant

## 2016-08-27 ENCOUNTER — Encounter: Payer: Self-pay | Admitting: Physician Assistant

## 2016-08-27 VITALS — BP 139/84 | HR 81 | Temp 98.3°F | Resp 16 | Ht 64.5 in | Wt 315.2 lb

## 2016-08-27 DIAGNOSIS — R41840 Attention and concentration deficit: Secondary | ICD-10-CM | POA: Diagnosis not present

## 2016-08-27 DIAGNOSIS — R609 Edema, unspecified: Secondary | ICD-10-CM | POA: Diagnosis not present

## 2016-08-27 LAB — POCT URINALYSIS DIP (MANUAL ENTRY)
Bilirubin, UA: NEGATIVE
Glucose, UA: NEGATIVE mg/dL
Ketones, POC UA: NEGATIVE mg/dL
Leukocytes, UA: NEGATIVE
NITRITE UA: NEGATIVE
PH UA: 5.5 (ref 5.0–8.0)
PROTEIN UA: NEGATIVE mg/dL
SPEC GRAV UA: 1.01 (ref 1.010–1.025)
UROBILINOGEN UA: 0.2 U/dL

## 2016-08-27 MED ORDER — BUPROPION HCL 100 MG PO TABS
100.0000 mg | ORAL_TABLET | Freq: Two times a day (BID) | ORAL | 6 refills | Status: DC
Start: 2016-08-27 — End: 2016-08-27

## 2016-08-27 MED ORDER — BUPROPION HCL 100 MG PO TABS: 100.0000 mg | ORAL_TABLET | Freq: Two times a day (BID) | ORAL | 6 refills | Status: DC

## 2016-08-27 MED FILL — buPROPion HCL 100 MG TABS: 100 | 30 days supply | Qty: 60 | Fill #0

## 2016-08-27 NOTE — Progress Notes (Signed)
08/28/2016 9:18 PM   DOB: [birth year] / MRN: [id number]  SUBJECTIVE:  Monica Hernandez is a 41 y.o. female presenting for swelling and to discuss weight loss options.   She would like some help with weight loss.  She walks two to three miles daily. She has tried contrave, phentermine and these have failed.  States that she had side effects with the Contrave and says that the naltrexone component made her very sick.   She denies a history of poor concentration, particularly at work and during school work.  She does tell me that she has difficulty paying attention at work and this also occurs during school work. She has tried Wellbutrin in the past for smoking cessation and tells me that she could also pay attention easier.    She is allergic to erythromycin; zithromax [azithromycin]; allegra-d [fexofenadine-pseudoephed er]; naltrexone; and sulfa antibiotics.   She  has a past medical history of Allergy; Anemia; Asthma; GERD (gastroesophageal reflux disease); Kidney stone; Mitral valve prolapse; and Ulcer.    She  reports that she has quit smoking. She has never used smokeless tobacco. She reports that she does not drink alcohol or use drugs. She  reports that she currently engages in sexual activity and has had female partners. She reports using the following method of birth control/protection: Surgical. The patient  has a past surgical history that includes Tubal ligation; Adenoidectomy, tonsillectomy and myringotomy with tube placement; Abdominal hysterectomy; Leg Surgery (Left); Cesarean section; and Fracture surgery.  Her family history includes Bone cancer in her father; Brain cancer in her father; Diabetes in her maternal grandmother; Heart disease in her maternal grandmother; Hypertension in her mother and sister; Lung cancer in her father; Stroke in her father and mother; Thyroid cancer in her mother.  Review of Systems  Constitutional: Negative for chills, diaphoresis and fever.  Eyes:  Negative.   Respiratory: Negative for cough, hemoptysis, sputum production, shortness of breath and wheezing.   Cardiovascular: Positive for leg swelling. Negative for chest pain and orthopnea.  Gastrointestinal: Negative for nausea.  Skin: Negative for rash.  Neurological: Negative for dizziness, sensory change, speech change, focal weakness and headaches.    The problem list and medications were reviewed and updated by myself where necessary and exist elsewhere in the encounter.   OBJECTIVE:  BP 139/84 (BP Location: Right Wrist, Patient Position: Sitting, Cuff Size: Large)   Pulse 81   Temp 98.3 F (36.8 C) (Oral)   Resp 16   Ht 5' 4.5" (1.638 m)   Wt (!) 315 lb 3.2 oz (143 kg)   SpO2 97%   BMI 53.27 kg/m   BP Readings from Last 3 Encounters:  08/27/16 139/84  08/05/16 (!) 146/81  07/16/16 126/80   Lab Results  Component Value Date   CREATININE 0.84 08/27/2016   BUN 11 08/27/2016   NA 141 08/27/2016   K 4.6 08/27/2016   CL 98 08/27/2016   CO2 24 08/27/2016   Lab Results  Component Value Date   HGBA1C 6.0 (H) 06/07/2016   Lab Results  Component Value Date   TSH 2.030 06/07/2016     Wt Readings from Last 3 Encounters:  08/27/16 (!) 315 lb 3.2 oz (143 kg)  08/05/16 (!) 302 lb 6.4 oz (137.2 kg)  07/16/16 (!) 317 lb (143.8 kg)     Physical Exam  Constitutional: She is oriented to person, place, and time. She is active.  Non-toxic appearance.  Eyes: EOM are normal. Pupils are equal,  round, and reactive to light.  Cardiovascular: Normal rate, regular rhythm, S1 normal, S2 normal, normal heart sounds and intact distal pulses.  Exam reveals no gallop, no friction rub and no decreased pulses.   No murmur heard. Pulmonary/Chest: Effort normal. No stridor. No tachypnea. No respiratory distress. She has no wheezes. She has no rales.  Abdominal: She exhibits no distension.  Musculoskeletal: She exhibits no edema.  Neurological: She is alert and oriented to person,  place, and time. She has normal strength and normal reflexes. She is not disoriented. She displays no atrophy. No cranial nerve deficit or sensory deficit. She exhibits normal muscle tone. Coordination and gait normal.  Skin: Skin is warm and dry. She is not diaphoretic. No pallor.  Psychiatric: Her behavior is normal.    Results for orders placed or performed in visit on 08/27/16 (from the past 72 hour(s))  CMP14+EGFR     Status: None   Collection Time: 08/27/16  5:37 PM  Result Value Ref Range   Glucose 92 65 - 99 mg/dL   BUN 11 6 - 24 mg/dL   Creatinine, Ser 0.84 0.57 - 1.00 mg/dL   GFR calc non Af Amer 85 >59 mL/min/1.73   GFR calc Af Amer 98 >59 mL/min/1.73   BUN/Creatinine Ratio 13 9 - 23   Sodium 141 134 - 144 mmol/L   Potassium 4.6 3.5 - 5.2 mmol/L   Chloride 98 96 - 106 mmol/L   CO2 24 20 - 29 mmol/L    Comment:               **Please note reference interval change**   Calcium 9.9 8.7 - 10.2 mg/dL   Total Protein 7.8 6.0 - 8.5 g/dL   Albumin 4.5 3.5 - 5.5 g/dL   Globulin, Total 3.3 1.5 - 4.5 g/dL   Albumin/Globulin Ratio 1.4 1.2 - 2.2   Bilirubin Total <0.2 0.0 - 1.2 mg/dL   Alkaline Phosphatase 95 39 - 117 IU/L   AST 23 0 - 40 IU/L   ALT 20 0 - 32 IU/L  POCT urinalysis dipstick     Status: Abnormal   Collection Time: 08/27/16  5:48 PM  Result Value Ref Range   Color, UA yellow yellow   Clarity, UA clear clear   Glucose, UA negative negative mg/dL   Bilirubin, UA negative negative   Ketones, POC UA negative negative mg/dL   Spec Grav, UA 1.010 1.010 - 1.025   Blood, UA small (A) negative   pH, UA 5.5 5.0 - 8.0   Protein Ur, POC negative negative mg/dL   Urobilinogen, UA 0.2 0.2 or 1.0 E.U./dL   Nitrite, UA Negative Negative   Leukocytes, UA Negative Negative    No results found.  ASSESSMENT AND PLAN:  Monica Hernandez was seen today for follow-up.  Diagnoses and all orders for this visit:  Swelling: Her BP could use some reduction and per her labs she is tolerating  Lasix without any difficulty.  Will start a low dose daily and recheck the BMET in about 1 week to ensure this is not hurting her.  -     CMP14+EGFR -     POCT urinalysis dipstick  Concentration deficit: Will try wellbutrin as this is certainly less risk than amphetamines.  If she finds that it is hleping her will consider increasing the dose.  -     buPROPion (WELLBUTRIN) 100 MG tablet; Take 1 tablet (100 mg total) by mouth 2 (two) times daily.   The patient  is advised to call or return to clinic if she does not see an improvement in symptoms, or to seek the care of the closest emergency department if she worsens with the above plan.   Philis Fendt, MHS, PA-C [facility] 08/28/2016 9:18 PM

## 2016-08-27 NOTE — Patient Instructions (Signed)
     IF you received an x-ray today, you will receive an invoice from [facility]. Please contact [facility] at [phone] with questions or concerns regarding your invoice.   IF you received labwork today, you will receive an invoice from [facility]. Please contact [facility] at [phone] with questions or concerns regarding your invoice.   Our billing staff will not be able to assist you with questions regarding bills from these companies.  You will be contacted with the lab results as soon as they are available. The fastest way to get your results is to activate your My Chart account. Instructions are located on the last page of this paperwork. If you have not heard from us regarding the results in 2 weeks, please contact this office.     

## 2016-08-28 ENCOUNTER — Other Ambulatory Visit: Payer: Self-pay | Admitting: Physician Assistant

## 2016-08-28 ENCOUNTER — Encounter: Payer: Self-pay | Admitting: Physician Assistant

## 2016-08-28 DIAGNOSIS — M7989 Other specified soft tissue disorders: Secondary | ICD-10-CM

## 2016-08-28 LAB — CMP14+EGFR
A/G RATIO: 1.4 (ref 1.2–2.2)
ALBUMIN: 4.5 g/dL (ref 3.5–5.5)
ALT: 20 IU/L (ref 0–32)
AST: 23 IU/L (ref 0–40)
Alkaline Phosphatase: 95 IU/L (ref 39–117)
BUN / CREAT RATIO: 13 (ref 9–23)
BUN: 11 mg/dL (ref 6–24)
CO2: 24 mmol/L (ref 20–29)
Calcium: 9.9 mg/dL (ref 8.7–10.2)
Chloride: 98 mmol/L (ref 96–106)
Creatinine, Ser: 0.84 mg/dL (ref 0.57–1.00)
GFR calc non Af Amer: 85 mL/min/{1.73_m2} (ref >59)
GFR, EST AFRICAN AMERICAN: 98 mL/min/{1.73_m2} (ref >59)
GLOBULIN, TOTAL: 3.3 g/dL (ref 1.5–4.5)
Glucose: 92 mg/dL (ref 65–99)
POTASSIUM: 4.6 mmol/L (ref 3.5–5.2)
SODIUM: 141 mmol/L (ref 134–144)
TOTAL PROTEIN: 7.8 g/dL (ref 6.0–8.5)

## 2016-08-28 MED ORDER — FUROSEMIDE 20 MG PO TABS: 20.0000 mg | ORAL_TABLET | Freq: Every day | ORAL | 1 refills | Status: DC

## 2016-08-28 MED ORDER — FUROSEMIDE 20 MG PO TABS: 20.0000 mg | ORAL_TABLET | Freq: Every day | ORAL | 3 refills | Status: DC

## 2016-09-04 ENCOUNTER — Ambulatory Visit: Payer: 59 | Admitting: Physician Assistant

## 2016-09-04 DIAGNOSIS — M7989 Other specified soft tissue disorders: Secondary | ICD-10-CM

## 2016-09-05 LAB — BASIC METABOLIC PANEL
BUN / CREAT RATIO: 13 (ref 9–23)
BUN: 13 mg/dL (ref 6–24)
CHLORIDE: 99 mmol/L (ref 96–106)
CO2: 22 mmol/L (ref 20–29)
CREATININE: 1.01 mg/dL — AB (ref 0.57–1.00)
Calcium: 9.3 mg/dL (ref 8.7–10.2)
GFR calc Af Amer: 79 mL/min/{1.73_m2} (ref >59)
GFR calc non Af Amer: 68 mL/min/{1.73_m2} (ref >59)
GLUCOSE: 101 mg/dL — AB (ref 65–99)
POTASSIUM: 4.4 mmol/L (ref 3.5–5.2)
Sodium: 137 mmol/L (ref 134–144)

## 2016-09-09 MED FILL — FUROSEMIDE 20 MG TABLET: 20 | 90 days supply | Qty: 90 | Fill #0

## 2016-09-09 MED FILL — LANSOPRAZOLE DR 30 MG CAP: 30 | 90 days supply | Qty: 90 | Fill #1

## 2016-09-18 ENCOUNTER — Encounter: Payer: Self-pay | Admitting: Physician Assistant

## 2016-09-22 ENCOUNTER — Other Ambulatory Visit: Payer: Self-pay | Admitting: Physician Assistant

## 2016-09-22 MED ORDER — LIRAGLUTIDE -WEIGHT MANAGEMENT 18 MG/3ML ~~LOC~~ SOPN: 0.6000 mg | PEN_INJECTOR | Freq: Every day | SUBCUTANEOUS | 3 refills | Status: DC

## 2016-09-23 ENCOUNTER — Other Ambulatory Visit: Payer: Self-pay | Admitting: Emergency Medicine

## 2016-09-23 MED ORDER — LIRAGLUTIDE -WEIGHT MANAGEMENT 18 MG/3ML ~~LOC~~ SOPN: 0.6000 mg | PEN_INJECTOR | Freq: Every day | SUBCUTANEOUS | 3 refills | Status: DC

## 2016-09-30 ENCOUNTER — Telehealth: Payer: Self-pay

## 2016-09-30 NOTE — Telephone Encounter (Signed)
Monica Hernandez,  I am working on a PA for [employer]. There is a question, "Is the patient unable to take a stimulant weight-loss medication?" I see that the patient is currently taking Bupropion for a concentration deficit so I was unsure how to answer. Please advise.

## 2016-10-03 NOTE — Telephone Encounter (Signed)
Given that she is already taking a wellbutrin I will not be putting her on any stimulants. Deliah BostonMichael Adhira Jamil, MS, PA-C 1:57 PM, 10/03/2016

## 2016-10-06 ENCOUNTER — Encounter: Payer: Self-pay | Admitting: Physician Assistant

## 2016-10-07 NOTE — Telephone Encounter (Signed)
PA sent to plan. Awaiting Determination. Follow up code [id number]

## 2016-10-09 ENCOUNTER — Telehealth: Payer: Self-pay

## 2016-10-09 MED FILL — buPROPion HCL 100 MG TABS: 100 | 30 days supply | Qty: 60 | Fill #1

## 2016-10-09 NOTE — Telephone Encounter (Signed)
I checked status of patient's prior auth on cover my meds today.  It is still being processed and recommended that we check back in about 48 hours.

## 2016-10-10 MED FILL — DICYCLOMINE 10 MG CAPSULE: 10 | 30 days supply | Qty: 90 | Fill #0

## 2016-10-10 NOTE — Telephone Encounter (Signed)
Checked on PA status yesterday and it still is being processed, advised to recheck on Monday.

## 2016-10-13 NOTE — Telephone Encounter (Signed)
Checked on status on Cover my Meds, however, there were still questions they needed answered.  I completed the new section and resubmitted the claim.  It said to recheck status in 1-5 business days.  Will recheck this week.

## 2016-10-14 NOTE — Telephone Encounter (Signed)
I can represribe if I need to. Deliah BostonMichael Clark, MS, PA-C 10:50 AM, 10/14/2016

## 2016-10-18 ENCOUNTER — Ambulatory Visit (INDEPENDENT_AMBULATORY_CARE_PROVIDER_SITE_OTHER): Payer: 59 | Admitting: Family Medicine

## 2016-10-18 ENCOUNTER — Ambulatory Visit (INDEPENDENT_AMBULATORY_CARE_PROVIDER_SITE_OTHER): Payer: 59

## 2016-10-18 VITALS — BP 127/89 | HR 75 | Temp 98.1°F | Resp 16 | Ht 64.0 in | Wt 310.0 lb

## 2016-10-18 DIAGNOSIS — M542 Cervicalgia: Secondary | ICD-10-CM

## 2016-10-18 DIAGNOSIS — M25512 Pain in left shoulder: Secondary | ICD-10-CM | POA: Diagnosis not present

## 2016-10-18 DIAGNOSIS — M50322 Other cervical disc degeneration at C5-C6 level: Secondary | ICD-10-CM | POA: Diagnosis not present

## 2016-10-18 DIAGNOSIS — M62838 Other muscle spasm: Secondary | ICD-10-CM

## 2016-10-18 MED ORDER — TRAMADOL HCL 50 MG PO TABS: 50.0000 mg | ORAL_TABLET | Freq: Three times a day (TID) | ORAL | 0 refills | Status: DC | PRN

## 2016-10-18 MED ORDER — METHOCARBAMOL 500 MG PO TABS: ORAL_TABLET | ORAL | 1 refills | Status: DC

## 2016-10-18 MED ORDER — DICLOFENAC SODIUM 75 MG PO TBEC: 75.0000 mg | DELAYED_RELEASE_TABLET | Freq: Two times a day (BID) | ORAL | 0 refills | Status: DC

## 2016-10-18 NOTE — Patient Instructions (Addendum)
Take the methocarbamol (Robaxin) 500 mg 1 in the morning and and supper and 2 at bedtime for muscle relaxant  Take the diclofenac one twice daily for pain and inflammation (do not take over-the-counter ibuprofen or Aleve while on this)  You can take additional Tylenol (acetaminophen) 500 mg 2 pills 3 times daily if necessary  Use the tramadol 50 mg 1 every 6 or 8 hours only if needed for severe pain  Return if not improving. You might end up having to get some physical therapy if problems persist too much.    IF you received an x-ray today, you will receive an invoice from [facility]. Please contact [facility] at [phone] with questions or concerns regarding your invoice.   IF you received labwork today, you will receive an invoice from [facility]. Please contact [facility] at [phone] with questions or concerns regarding your invoice.   Our billing staff will not be able to assist you with questions regarding bills from these companies.  You will be contacted with the lab results as soon as they are available. The fastest way to get your results is to activate your My Chart account. Instructions are located on the last page of this paperwork. If you have not heard from us regarding the results in 2 weeks, please contact this office.

## 2016-10-18 NOTE — Progress Notes (Signed)
Patient ID: Monica Hernandez, female    DOB: April 19, 1975  Age: 41 y.o. MRN: [id number]  Chief Complaint  Patient presents with  . Neck Pain    x 3 days  . Headache    x 3 days  . Shoulder Pain    left    Subjective:   41 year old lady who was helping her husband in the building of their deck about 3 weeks ago. She did at one point feel her left shoulder pop, however did not have acute or persistent pain at that time. However 7% since she is gotten steadily progressive pain in the left side of the neck and shoulder area. She has a tender area right on the port of the shoulder. She is very tender and painful behind her left ear in the paraspinous muscles along the edge of the spine. It seems to be migrating more toward the spine also now. It hurts whenever position she moves her head. She has been taking Tylenol and ibuprofen and having her husband rub it down.  She is in the process of trying to get approved to take Saxenda for weight loss. She is on Prevacid for her stomach, seems to get edema from that and takes Lasix every other day for the, and is on Wellbutrin.    Current allergies, medications, problem list, past/family and social histories reviewed.  Objective:  BP 127/89   Pulse 75   Temp 98.1 F (36.7 C) (Oral)   Resp 16   Ht 5\' 4"  (1.626 m)   Wt (!) 310 lb (140.6 kg)   SpO2 96%   BMI 53.21 kg/m   Overweight lady, discomfort when she moves her neck or upper back and shoulders. Neck is fairly supple with good range of motion but has significant pain on virtually all points of range of motion of the neck. She is tender in the left side of the neck paraspinous muscles on down into the trapezius to the top of the scapula. She is tender right to left shoulder. Has pretty good range of motion of the left arm and strength is adequate. Chest clear. Heart regular.  Assessment & Plan:   Assessment: 1. Pain in joint of left shoulder   2. Cervical pain   3. Cervical paraspinous  muscle spasm       Plan: We'll check a C-spine and shoulder x-ray and proceed from there.  IMPRESSION: 1. Straightening and slight reversal of the expected cervical lordosis, nonspecific though could be seen in the setting of muscle spasm. 2. Moderate to severe DDD of C5-C6.  Orders Placed This Encounter  Procedures  . DG Cervical Spine Complete    Standing Status:   Future    Number of Occurrences:   1    Standing Expiration Date:   10/18/2017    Order Specific Question:   Reason for Exam (SYMPTOM  OR DIAGNOSIS REQUIRED)    Answer:   pain cervical spine and shoulder    Order Specific Question:   Is the patient pregnant?    Answer:   No    Order Specific Question:   Preferred imaging location?    Answer:   External  . DG Shoulder Left    Standing Status:   Future    Number of Occurrences:   1    Standing Expiration Date:   10/18/2017    Order Specific Question:   Reason for Exam (SYMPTOM  OR DIAGNOSIS REQUIRED)    Answer:   pain cervical spine  and shoulder    Order Specific Question:   Is the patient pregnant?    Answer:   No    Order Specific Question:   Preferred imaging location?    Answer:   External    Meds ordered this encounter  Medications  . methocarbamol (ROBAXIN) 500 MG tablet    Sig: Take one 3 times daily at breakfast lunch and supper and 2 at bedtime for muscle relaxant.    Dispense:  40 tablet    Refill:  1  . diclofenac (VOLTAREN) 75 MG EC tablet    Sig: Take 1 tablet (75 mg total) by mouth 2 (two) times daily.    Dispense:  30 tablet    Refill:  0  . traMADol (ULTRAM) 50 MG tablet    Sig: Take 1 tablet (50 mg total) by mouth every 8 (eight) hours as needed.    Dispense:  15 tablet    Refill:  0         Patient Instructions   Take the methocarbamol (Robaxin) 500 mg 1 in the morning and and supper and 2 at bedtime for muscle relaxant  Take the diclofenac one twice daily for pain and inflammation (do not take over-the-counter ibuprofen or  Aleve while on this)  You can take additional Tylenol (acetaminophen) 500 mg 2 pills 3 times daily if necessary  Use the tramadol 50 mg 1 every 6 or 8 hours only if needed for severe pain  Return if not improving. You might end up having to get some physical therapy if problems persist too much.    IF you received an x-ray today, you will receive an invoice from [facility]. Please contact [facility] at [phone] with questions or concerns regarding your invoice.   IF you received labwork today, you will receive an invoice from [facility]. Please contact [facility] at [phone] with questions or concerns regarding your invoice.   Our billing staff will not be able to assist you with questions regarding bills from these companies.  You will be contacted with the lab results as soon as they are available. The fastest way to get your results is to activate your My Chart account. Instructions are located on the last page of this paperwork. If you have not heard from [place] regarding the results in 2 weeks, please contact this office.         Return if symptoms worsen or fail to improve.   HOPPER,DAVID, MD 10/18/2016

## 2016-10-20 ENCOUNTER — Encounter: Payer: Self-pay | Admitting: Physician Assistant

## 2016-10-20 NOTE — Telephone Encounter (Signed)
After speaking with Deliah BostonMichael Clark, PA-C, went to [email or web address] to print out a coupon for the patient, however, it was requesting patient set up a password, SSN, etc.  I called patient back and advised her of this and to go to website to set up her own account and password.  She said that she would do so.  I told [facility] to [facility] with any problems.

## 2016-10-20 NOTE — Telephone Encounter (Signed)
Casimiro NeedleMichael, Received a DENIAL on patient's Saxenda with the recommendation for provider to go to [email or web address] for a $25 copay card or a save $200 per RX card.   I will contact the patient and tell her that there was a denial and we are trying to get her a savings card.   If you need me to do anything else, don't hesitate to ask! Thanks, [employer]

## 2016-10-20 NOTE — Telephone Encounter (Signed)
Thanks for working on this.  Lets proceed with the most cost efficient option. Please let me know you need anything else from me. Deliah BostonMichael Graylee Arutyunyan, MS, PA-C 9:58 AM, 10/20/2016

## 2016-10-27 ENCOUNTER — Encounter: Payer: Self-pay | Admitting: Physician Assistant

## 2016-10-27 ENCOUNTER — Ambulatory Visit (INDEPENDENT_AMBULATORY_CARE_PROVIDER_SITE_OTHER): Payer: 59 | Admitting: Physician Assistant

## 2016-10-27 DIAGNOSIS — M7989 Other specified soft tissue disorders: Secondary | ICD-10-CM

## 2016-10-27 DIAGNOSIS — R7303 Prediabetes: Secondary | ICD-10-CM | POA: Diagnosis not present

## 2016-10-27 MED ORDER — LIRAGLUTIDE -WEIGHT MANAGEMENT 18 MG/3ML ~~LOC~~ SOPN: 0.6000 mg | PEN_INJECTOR | Freq: Every day | SUBCUTANEOUS | 11 refills | Status: DC

## 2016-10-27 NOTE — Patient Instructions (Signed)
     IF you received an x-ray today, you will receive an invoice from [facility]. Please contact [facility] at [phone] with questions or concerns regarding your invoice.   IF you received labwork today, you will receive an invoice from [facility]. Please contact [facility] at [phone] with questions or concerns regarding your invoice.   Our billing staff will not be able to assist you with questions regarding bills from these companies.  You will be contacted with the lab results as soon as they are available. The fastest way to get your results is to activate your My Chart account. Instructions are located on the last page of this paperwork. If you have not heard from us regarding the results in 2 weeks, please contact this office.     

## 2016-10-27 NOTE — Progress Notes (Signed)
10/29/2016 8:41 AM   DOB: 02-Jun-1975 / MRN: [id number]  SUBJECTIVE:  Monica Hernandez is a 41 y.o. female presenting for leg swelling and tells me this has drastically improved since starting lasix.  She continues to struggle with her weight. She has looked into weight loss surgery and does not think that she will be able to afford this as it cost 9000 dollars and she has a child and school to think about.   She has tried Wellbutrin without success and she is maintained on this.  She has tried [employer] and this caused facial numbness and she had difficulty breathing. She has tried Contrave that this seemed to flare up her stomach and led to dysuria and hematochezia. She was advised to cut the medication in half and she tells me that the side effects lessened but persisted. Phentermine did help but she was advised that she can not stay on the medication due to risk of seizure. She has tried [employer] OTC and this did not help.   She walks 2 miles daily for exercise and has been doing this for about three months now.  She is currently doing weight watchers to try and lose weight.   She has a history of prediabetes.   She is allergic to erythromycin; zithromax [azithromycin]; allegra-d [fexofenadine-pseudoephed er]; naltrexone; and sulfa antibiotics.   She  has a past medical history of Allergy; Anemia; Asthma; GERD (gastroesophageal reflux disease); Kidney stone; Mitral valve prolapse; and Ulcer.    She  reports that she has quit smoking. She has never used smokeless tobacco. She reports that she does not drink alcohol or use drugs. She  reports that she currently engages in sexual activity and has had female partners. She reports using the following method of birth control/protection: Surgical. The patient  has a past surgical history that includes Tubal ligation; Adenoidectomy, tonsillectomy and myringotomy with tube placement; Abdominal hysterectomy; Leg Surgery (Left); Cesarean section; and Fracture  surgery.  Her family history includes Bone cancer in her father; Brain cancer in her father; Diabetes in her maternal grandmother; Heart disease in her maternal grandmother; Hypertension in her mother and sister; Lung cancer in her father; Stroke in her father and mother; Thyroid cancer in her mother.  Review of Systems  Constitutional: Negative for chills, diaphoresis and fever.  Respiratory: Negative for cough, hemoptysis, sputum production, shortness of breath and wheezing.   Cardiovascular: Negative for chest pain, orthopnea and leg swelling.  Gastrointestinal: Negative for nausea.  Skin: Negative for rash.  Neurological: Negative for dizziness.    The problem list and medications were reviewed and updated by myself where necessary and exist elsewhere in the encounter.   OBJECTIVE:  BP 128/70   Pulse 71   Resp 18   Ht 5\' 4"  (1.626 m)   Wt (!) 313 lb (142 kg)   SpO2 98%   BMI 53.73 kg/m   BP Readings from Last 3 Encounters:  10/27/16 128/70  10/18/16 127/89  08/27/16 139/84   Wt Readings from Last 3 Encounters:  10/27/16 (!) 313 lb (142 kg)  10/18/16 (!) 310 lb (140.6 kg)  08/27/16 (!) 315 lb 3.2 oz (143 kg)   Lab Results  Component Value Date   HGBA1C 6.0 (H) 06/07/2016   Lab Results  Component Value Date   CREATININE 0.99 10/27/2016   BUN 13 10/27/2016   NA 140 10/27/2016   K 4.8 10/27/2016   CL 101 10/27/2016   CO2 22 10/27/2016   Lab  Results  Component Value Date   TSH 2.030 06/07/2016      Physical Exam  Constitutional: She is active.  Non-toxic appearance.  Cardiovascular: Normal rate, regular rhythm, S1 normal, S2 normal, normal heart sounds and intact distal pulses.  Exam reveals no gallop, no friction rub and no decreased pulses.   No murmur heard. Pulmonary/Chest: Effort normal. No stridor. No tachypnea. No respiratory distress. She has no wheezes. She has no rales.  Abdominal: She exhibits no distension.  Musculoskeletal: She exhibits no  edema.  Neurological: She is alert.  Skin: Skin is warm and dry. She is not diaphoretic. No pallor.    Results for orders placed or performed in visit on 10/27/16 (from the past 72 hour(s))  Renal Function Panel     Status: None   Collection Time: 10/27/16  5:56 PM  Result Value Ref Range   Glucose 82 65 - 99 mg/dL   BUN 13 6 - 24 mg/dL   Creatinine, Ser 4.17 0.57 - 1.00 mg/dL   GFR calc non Af Amer 70 >59 mL/min/1.73   GFR calc Af Amer 81 >59 mL/min/1.73   BUN/Creatinine Ratio 13 9 - 23   Sodium 140 134 - 144 mmol/L   Potassium 4.8 3.5 - 5.2 mmol/L   Chloride 101 96 - 106 mmol/L   CO2 22 20 - 29 mmol/L   Calcium 9.8 8.7 - 10.2 mg/dL   Phosphorus 3.6 2.5 - 4.5 mg/dL   Albumin 4.4 3.5 - 5.5 g/dL    No results found.  ASSESSMENT AND PLAN:  Shakemia was seen today for follow-up.  Diagnoses and all orders for this visit:  Morbid obesity (HCC): She has tried most every medication without success. She is and has been exercising daily and is doing weight watchers. Will retry GLP-1 daily and if insurance continues to deny will refer to medical weight loss management.  -     Renal Function Panel  Leg swelling -     Renal Function Panel  Pre-diabetes  Other orders -     Liraglutide -Weight Management (SAXENDA) 18 MG/3ML SOPN; Inject 0.6-3 mg into the skin daily. May increase dose by 0.6 mg/day each week until max of 3 mg daily is achieved.    The patient is advised to call or return to clinic if she does not see an improvement in symptoms, or to seek the care of the closest emergency department if she worsens with the above plan.   Deliah Boston, MHS, PA-C [facility] 10/29/2016 8:41 AM

## 2016-10-28 LAB — RENAL FUNCTION PANEL
ALBUMIN: 4.4 g/dL (ref 3.5–5.5)
BUN/Creatinine Ratio: 13 (ref 9–23)
BUN: 13 mg/dL (ref 6–24)
CALCIUM: 9.8 mg/dL (ref 8.7–10.2)
CHLORIDE: 101 mmol/L (ref 96–106)
CO2: 22 mmol/L (ref 20–29)
Creatinine, Ser: 0.99 mg/dL (ref 0.57–1.00)
GFR calc Af Amer: 81 mL/min/{1.73_m2} (ref >59)
GFR calc non Af Amer: 70 mL/min/{1.73_m2} (ref >59)
Glucose: 82 mg/dL (ref 65–99)
Phosphorus: 3.6 mg/dL (ref 2.5–4.5)
Potassium: 4.8 mmol/L (ref 3.5–5.2)
Sodium: 140 mmol/L (ref 134–144)

## 2016-10-29 ENCOUNTER — Telehealth: Payer: Self-pay

## 2016-10-29 NOTE — Telephone Encounter (Signed)
Started PA for Saxenda today (for the second time, see chart) Key code is [id number] and a response should be given within 5 business days.

## 2016-10-31 NOTE — Telephone Encounter (Signed)
Checked on status of PA today.  It is still processing.  Will check again next week.

## 2016-11-05 NOTE — Telephone Encounter (Signed)
Ins[employer] called to get additional information.  I provided what they requested.  I inquired about how much longer authorization was going to take and they said no longer than 72 more hours. Called patient to advise her and she was very receptive to our keeping her notified of the progress.

## 2016-11-11 NOTE — Telephone Encounter (Signed)
Received a faxed approval for Saxenda with a maximum of 4 RF's.  PA good from 11/02/16-12/25-18. Patient and provider notified.

## 2016-11-15 ENCOUNTER — Encounter: Payer: Self-pay | Admitting: Physician Assistant

## 2016-11-19 ENCOUNTER — Ambulatory Visit (INDEPENDENT_AMBULATORY_CARE_PROVIDER_SITE_OTHER): Payer: 59 | Admitting: Family Medicine

## 2016-11-19 ENCOUNTER — Encounter: Payer: Self-pay | Admitting: Family Medicine

## 2016-11-19 DIAGNOSIS — R42 Dizziness and giddiness: Secondary | ICD-10-CM | POA: Diagnosis not present

## 2016-11-19 DIAGNOSIS — T887XXA Unspecified adverse effect of drug or medicament, initial encounter: Secondary | ICD-10-CM | POA: Diagnosis not present

## 2016-11-19 NOTE — Progress Notes (Signed)
Chief Complaint  Patient presents with  . Dizziness    onset: Sunday, monday dizzy and worse, tuesday dizziness with nausea and almost passed out, wednesday dizzy.  Per pt thinks the dizziness is coming from weight loss medication, started after increasing dosage from .6 to .12    HPI   Pt started having dizziness 4 days ago when she increased her weightloss drug saxenda She reports that she had severe dizziness She reports that she also gets a hot feeling, weakness and ticking behind her eye She ate fruit and hydrated and the episode was waxing and waning all night She reports that she consumes 1800 calories  Wt Readings from Last 3 Encounters:  11/19/16 (!) 310 lb 3.2 oz (140.7 kg)  10/27/16 (!) 313 lb (142 kg)  10/18/16 (!) 310 lb (140.6 kg)   Lab Results  Component Value Date   HGBA1C 6.0 (H) 06/07/2016     Past Medical History:  Diagnosis Date  . Allergy   . Anemia   . Asthma   . GERD (gastroesophageal reflux disease)   . Kidney stone   . Mitral valve prolapse   . Ulcer     Current Outpatient Prescriptions  Medication Sig Dispense Refill  . albuterol (PROVENTIL HFA;VENTOLIN HFA) 108 (90 Base) MCG/ACT inhaler Inhale 2 puffs into the lungs every 4 (four) hours as needed for wheezing or shortness of breath. 1 Inhaler 0  . diclofenac (VOLTAREN) 75 MG EC tablet Take 1 tablet (75 mg total) by mouth 2 (two) times daily. 30 tablet 0  . dicyclomine (BENTYL) 10 MG capsule Take 1 capsule (10 mg total) by mouth 3 (three) times daily before meals. 90 capsule 11  . furosemide (LASIX) 20 MG tablet Take 1 tablet (20 mg total) by mouth daily. 90 tablet 1  . lansoprazole (PREVACID) 30 MG capsule Take 1 capsule (30 mg total) by mouth daily. 30 capsule 11  . buPROPion (WELLBUTRIN) 100 MG tablet Take 1 tablet (100 mg total) by mouth 2 (two) times daily. (Patient not taking: Reported on 11/19/2016) 60 tablet 6  . methocarbamol (ROBAXIN) 500 MG tablet Take one 3 times daily at breakfast  lunch and supper and 2 at bedtime for muscle relaxant. (Patient not taking: Reported on 11/19/2016) 40 tablet 1  . traMADol (ULTRAM) 50 MG tablet Take 1 tablet (50 mg total) by mouth every 8 (eight) hours as needed. (Patient not taking: Reported on 11/19/2016) 15 tablet 0   No current facility-administered medications for this visit.     Allergies:  Allergies  Allergen Reactions  . Erythromycin   . Zithromax [Azithromycin]   . Allegra-D [Fexofenadine-Pseudoephed Er]   . Naltrexone     Bloody stools.   . Sulfa Antibiotics Rash    Past Surgical History:  Procedure Laterality Date  . ABDOMINAL HYSTERECTOMY    . ADENOIDECTOMY, TONSILLECTOMY AND MYRINGOTOMY WITH TUBE PLACEMENT    . CESAREAN SECTION    . FRACTURE SURGERY    . LEG SURGERY Left   . TUBAL LIGATION      Social History   Social History  . Marital status: Married    Spouse name: N/A  . Number of children: N/A  . Years of education: N/A   Occupational History  .  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 Smoking status: Former Games developermoker  . Smokeless tobacco: Never Used  . Alcohol use No  . Drug use: No  . Sexual activity: Yes  Partners: Male    Birth control/ protection: Surgical   Other Topics Concern  . None   Social History Narrative  . None    ROS  Objective: Vitals:   11/19/16 1734  BP: 125/79  Pulse: 85  Resp: 17  Temp: 98.2 F (36.8 C)  TempSrc: Oral  SpO2: 93%  Weight: (!) 310 lb 3.2 oz (140.7 kg)  Height:  (1.626 m)    Physical Exam  Constitutional: She is oriented to person, place, and time. She appears well-developed and well-nourished.  HENT:  Head: Normocephalic and atraumatic.  Eyes: Conjunctivae and EOM are normal.  Cardiovascular: Normal rate, regular rhythm and normal heart sounds.   Pulmonary/Chest: Effort normal and breath sounds normal. No respiratory distress. She has no wheezes.  Neurological: She is alert and oriented to person, place, and time.  She displays normal reflexes. No cranial nerve deficit. Coordination normal.  Psychiatric: She has a normal mood and affect. Her behavior is normal. Judgment and thought content normal.    Assessment and Plan Jayra was seen today for dizziness.  Diagnoses and all orders for this visit:  Morbid obesity (HCC) -     Amb Ref to Medical Weight Management  medication side effects and dizziness Advised to follow up with Medical Weight Mgmt with Dr. Holley Dexter to stop Saxenda Continue weight watchers programs Dizziness likely from hypoglycemia  A total of 25 minutes were spent face-to-face with the patient during this encounter and over half of that time was spent on counseling and coordination of care.  Zoe A Stallings

## 2016-11-19 NOTE — Patient Instructions (Addendum)
IF you received an x-ray today, you will receive an invoice from [facility]. Please contact [facility] at [phone] with questions or concerns regarding your invoice.   IF you received labwork today, you will receive an invoice from [facility]. Please contact [facility] at [phone] with questions or concerns regarding your invoice.   Our billing staff will not be able to assist you with questions regarding bills from these companies.  You will be contacted with the lab results as soon as they are available. The fastest way to get your results is to activate your My Chart account. Instructions are located on the last page of this paperwork. If you have not heard from [place] regarding the results in 2 weeks, please contact this office.    We recommend that you schedule a mammogram for breast cancer screening. Typically, you do not need a referral to do this. Please contact a local imaging center to schedule your mammogram.  [facility] - [phone]  *ask for the Radiology Department [facility] - [phone] or [phone]  [facility] - [phone] [place] [phone] [facility] - [phone]  *ask for the [facility] - [phone]  *ask for the Radiology Department [facility] - [phone]  *ask for the [place] - [phone] Hypoglycemia Hypoglycemia occurs when the level of sugar (glucose) in the blood is too low. Glucose is a type of sugar that provides the body's main source of energy. Certain hormones (insulin and glucagon) control the level of glucose in the blood. Insulin lowers blood glucose, and glucagon increases blood glucose. Hypoglycemia can result from having too much insulin in the bloodstream, or from not eating enough food that contains glucose. Hypoglycemia can happen in  people who do or do not have diabetes. It can develop quickly, and it can be a medical emergency. What are the causes? Hypoglycemia occurs most often in people who have diabetes. If you have diabetes, hypoglycemia may be caused by:  Diabetes medicine.  Not eating enough, or not eating often enough.  Increased physical activity.  Drinking alcohol, especially when you have not eaten recently.  If you do not have diabetes, hypoglycemia may be caused by:  A tumor in the pancreas. The pancreas is the organ that makes insulin.  Not eating enough, or not eating for long periods at a time (fasting).  Severe infection or illness that affects the liver, heart, or kidneys.  Certain medicines.  You may also have reactive hypoglycemia. This condition causes hypoglycemia within 4 hours of eating a meal. This may occur after having stomach surgery. Sometimes, the cause of reactive hypoglycemia is not known. What increases the risk? Hypoglycemia is more likely to develop in:  People who have diabetes and take medicines to lower blood glucose.  People who abuse alcohol.  People who have a severe illness.  What are the signs or symptoms? Hypoglycemia may not cause any symptoms. If you have symptoms, they may include:  Hunger.  Anxiety.  Sweating and feeling clammy.  Confusion.  Dizziness or feeling light-headed.  Sleepiness.  Nausea.  Increased heart rate.  Headache.  Blurry vision.  Seizure.  Nightmares.  Tingling or numbness around the mouth, lips, or tongue.  A change in speech.  Decreased ability to concentrate.  A change in coordination.  Restless sleep.  Tremors or shakes.  Fainting.  Irritability.  How  is this diagnosed? Hypoglycemia is diagnosed with a blood test to measure your blood glucose level. This blood test is done while you are having symptoms. Your health care provider may also do a physical exam and review your medical history. If you do  not have diabetes, other tests may be done to find the cause of your hypoglycemia. How is this treated? This condition can often be treated by immediately eating or drinking something that contains glucose, such as:  3-4 sugar tablets (glucose pills).  Glucose gel, 15-gram tube.  Fruit juice, 4 oz (120 mL).  Regular soda (not diet soda), 4 oz (120 mL).  Low-fat milk, 4 oz (120 mL).  Several pieces of hard candy.  Sugar or honey, 1 Tbsp.  Treating Hypoglycemia If You Have Diabetes  If you are alert and able to swallow safely, follow the 15:15 rule:  Take 15 grams of a rapid-acting carbohydrate. Rapid-acting options include: ? 1 tube of glucose gel. ? 3 glucose pills. ? 6-8 pieces of hard candy. ? 4 oz (120 mL) of fruit juice. ? 4 oz (120 ml) of regular (not diet) soda.  Check your blood glucose 15 minutes after you take the carbohydrate.  If the repeat blood glucose level is still at or below 70 mg/dL (3.9 mmol/L), take 15 grams of a carbohydrate again.  If your blood glucose level does not increase above 70 mg/dL (3.9 mmol/L) after 3 tries, seek emergency medical care.  After your blood glucose level returns to normal, eat a meal or a snack within 1 hour.  Treating Severe Hypoglycemia Severe hypoglycemia is when your blood glucose level is at or below 54 mg/dL (3 mmol/L). Severe hypoglycemia is an emergency. Do not wait to see if the symptoms will go away. Get medical help right away. Call your local emergency services (911 in the [place] Do not drive yourself to the hospital. If you have severe hypoglycemia and you cannot eat or drink, you may need an injection of glucagon. A family member or close friend should learn how to check your blood glucose and how to give you a glucagon injection. Ask your health care provider if you need to have an emergency glucagon injection kit available. Severe hypoglycemia may need to be treated in a hospital. The treatment may include getting  glucose through an IV tube. You may also need treatment for the cause of your hypoglycemia. Follow these instructions at home: General instructions  Avoid any diets that cause you to not eat enough food. Talk with your health care provider before you start any new diet.  Take over-the-counter and prescription medicines only as told by your health care provider.  Limit alcohol intake to no more than 1 drink per day for nonpregnant women and 2 drinks per day for men. One drink equals 12 oz of beer, 5 oz of wine, or 1 oz of hard liquor.  Keep all follow-up visits as told by your health care provider. This is important. If You Have Diabetes:   Make sure you know the symptoms of hypoglycemia.  Always have a rapid-acting carbohydrate snack with you to treat low blood sugar.  Follow your diabetes management plan, as told by your health care provider. Make sure you: ? Take your medicines as directed. ? Follow your exercise plan. ? Follow your meal plan. Eat on time, and do not skip meals. ? Check your blood glucose as often as directed. Make sure to check your blood glucose before and after exercise.  If you exercise longer or in a different way than usual, check your blood glucose more often. ? Follow your sick day plan whenever you cannot eat or drink normally. Make this plan in advance with your health care provider.  Share your diabetes management plan with people in your workplace, school, and household.  Check your urine for ketones when you are ill and as told by your health care provider.  Carry a medical alert card or wear medical alert jewelry. If You Have Reactive Hypoglycemia or Low Blood Sugar From Other Causes:  Monitor your blood glucose as told by your health care provider.  Follow instructions from your health care provider about eating or drinking restrictions. Contact a health care provider if:  You have problems keeping your blood glucose in your target range.  You  have frequent episodes of hypoglycemia. Get help right away if:  You continue to have hypoglycemia symptoms after eating or drinking something containing glucose.  Your blood glucose is at or below 54 mg/dL (3 mmol/L).  You have a seizure.  You faint. These symptoms may represent a serious problem that is an emergency. Do not wait to see if the symptoms will go away. Get medical help right away. Call your local emergency services (911 in the [place] Do not drive yourself to the hospital. This information is not intended to replace advice given to you by your health care provider. Make sure you discuss any questions you have with your health care provider. Document Released: 02/24/2005 Document Revised: 08/08/2015 Document Reviewed: 03/30/2015 Elsevier Interactive Patient Education  [employer].

## 2016-12-06 ENCOUNTER — Encounter: Payer: Self-pay | Admitting: Physician Assistant

## 2016-12-08 ENCOUNTER — Telehealth: Payer: 59 | Admitting: Family

## 2016-12-08 DIAGNOSIS — J329 Chronic sinusitis, unspecified: Secondary | ICD-10-CM | POA: Diagnosis not present

## 2016-12-08 DIAGNOSIS — B9689 Other specified bacterial agents as the cause of diseases classified elsewhere: Secondary | ICD-10-CM

## 2016-12-08 MED ORDER — AMOXICILLIN-POT CLAVULANATE 875-125 MG PO TABS: 1.0000 | ORAL_TABLET | Freq: Two times a day (BID) | ORAL | 0 refills | Status: AC

## 2016-12-08 MED FILL — LANSOPRAZOLE DR 30 MG CAP: 30 | 90 days supply | Qty: 90 | Fill #2

## 2016-12-08 MED FILL — AMOX-CLAV 875-125 MG TABLET: 875-125 | 7 days supply | Qty: 14 | Fill #0

## 2016-12-08 MED FILL — DICYCLOMINE 10 MG CAPSULE: 10 | 30 days supply | Qty: 90 | Fill #1

## 2016-12-08 MED FILL — FUROSEMIDE 20 MG TABLET: 20 | 90 days supply | Qty: 90 | Fill #1

## 2016-12-08 NOTE — Progress Notes (Signed)

## 2016-12-26 DIAGNOSIS — R609 Edema, unspecified: Secondary | ICD-10-CM | POA: Diagnosis not present

## 2016-12-26 DIAGNOSIS — K219 Gastro-esophageal reflux disease without esophagitis: Secondary | ICD-10-CM | POA: Diagnosis not present

## 2016-12-26 DIAGNOSIS — E162 Hypoglycemia, unspecified: Secondary | ICD-10-CM | POA: Diagnosis not present

## 2016-12-26 DIAGNOSIS — R0683 Snoring: Secondary | ICD-10-CM | POA: Diagnosis not present

## 2016-12-27 DIAGNOSIS — R609 Edema, unspecified: Secondary | ICD-10-CM | POA: Diagnosis not present

## 2016-12-27 DIAGNOSIS — R0683 Snoring: Secondary | ICD-10-CM | POA: Diagnosis not present

## 2016-12-27 DIAGNOSIS — E162 Hypoglycemia, unspecified: Secondary | ICD-10-CM | POA: Diagnosis not present

## 2016-12-29 ENCOUNTER — Other Ambulatory Visit (HOSPITAL_COMMUNITY): Payer: Self-pay | Admitting: General Surgery

## 2017-01-06 ENCOUNTER — Ambulatory Visit (HOSPITAL_COMMUNITY)
Admission: RE | Admit: 2017-01-06 | Discharge: 2017-01-06 | Disposition: A | Discharge status: Home / Self Care | Payer: 59 | Source: Ambulatory Visit | Attending: General Surgery | Admitting: General Surgery

## 2017-01-06 ENCOUNTER — Encounter: Payer: Self-pay | Admitting: Registered"

## 2017-01-06 ENCOUNTER — Encounter: Payer: 59 | Attending: General Surgery | Admitting: Registered"

## 2017-01-06 DIAGNOSIS — Z8261 Family history of arthritis: Secondary | ICD-10-CM | POA: Diagnosis not present

## 2017-01-06 DIAGNOSIS — R609 Edema, unspecified: Secondary | ICD-10-CM | POA: Diagnosis not present

## 2017-01-06 DIAGNOSIS — Z713 Dietary counseling and surveillance: Secondary | ICD-10-CM | POA: Diagnosis not present

## 2017-01-06 DIAGNOSIS — Z823 Family history of stroke: Secondary | ICD-10-CM | POA: Insufficient documentation

## 2017-01-06 DIAGNOSIS — I10 Essential (primary) hypertension: Secondary | ICD-10-CM | POA: Insufficient documentation

## 2017-01-06 DIAGNOSIS — Z8719 Personal history of other diseases of the digestive system: Secondary | ICD-10-CM | POA: Insufficient documentation

## 2017-01-06 DIAGNOSIS — R0683 Snoring: Secondary | ICD-10-CM | POA: Diagnosis not present

## 2017-01-06 DIAGNOSIS — K224 Dyskinesia of esophagus: Secondary | ICD-10-CM | POA: Diagnosis not present

## 2017-01-06 DIAGNOSIS — K219 Gastro-esophageal reflux disease without esophagitis: Secondary | ICD-10-CM | POA: Diagnosis not present

## 2017-01-06 DIAGNOSIS — Z8041 Family history of malignant neoplasm of ovary: Secondary | ICD-10-CM | POA: Diagnosis not present

## 2017-01-06 DIAGNOSIS — Z8249 Family history of ischemic heart disease and other diseases of the circulatory system: Secondary | ICD-10-CM | POA: Insufficient documentation

## 2017-01-06 DIAGNOSIS — Z9889 Other specified postprocedural states: Secondary | ICD-10-CM | POA: Insufficient documentation

## 2017-01-06 DIAGNOSIS — Z79899 Other long term (current) drug therapy: Secondary | ICD-10-CM | POA: Insufficient documentation

## 2017-01-06 DIAGNOSIS — E282 Polycystic ovarian syndrome: Secondary | ICD-10-CM | POA: Insufficient documentation

## 2017-01-06 DIAGNOSIS — J45909 Unspecified asthma, uncomplicated: Secondary | ICD-10-CM | POA: Diagnosis not present

## 2017-01-06 DIAGNOSIS — E119 Type 2 diabetes mellitus without complications: Secondary | ICD-10-CM | POA: Insufficient documentation

## 2017-01-06 DIAGNOSIS — Z803 Family history of malignant neoplasm of breast: Secondary | ICD-10-CM | POA: Diagnosis not present

## 2017-01-06 DIAGNOSIS — Z90710 Acquired absence of both cervix and uterus: Secondary | ICD-10-CM | POA: Diagnosis not present

## 2017-01-06 DIAGNOSIS — E669 Obesity, unspecified: Secondary | ICD-10-CM

## 2017-01-06 DIAGNOSIS — Z888 Allergy status to other drugs, medicaments and biological substances status: Secondary | ICD-10-CM | POA: Insufficient documentation

## 2017-01-06 DIAGNOSIS — K449 Diaphragmatic hernia without obstruction or gangrene: Secondary | ICD-10-CM | POA: Insufficient documentation

## 2017-01-06 DIAGNOSIS — E162 Hypoglycemia, unspecified: Secondary | ICD-10-CM | POA: Insufficient documentation

## 2017-01-06 DIAGNOSIS — Z811 Family history of alcohol abuse and dependence: Secondary | ICD-10-CM | POA: Diagnosis not present

## 2017-01-06 DIAGNOSIS — Z8042 Family history of malignant neoplasm of prostate: Secondary | ICD-10-CM | POA: Diagnosis not present

## 2017-01-06 DIAGNOSIS — Z87442 Personal history of urinary calculi: Secondary | ICD-10-CM | POA: Diagnosis not present

## 2017-01-06 DIAGNOSIS — Z87891 Personal history of nicotine dependence: Secondary | ICD-10-CM | POA: Diagnosis not present

## 2017-01-06 NOTE — Progress Notes (Signed)
Pre-Op Assessment Visit:  Pre-Operative RYGB Surgery  Medical Nutrition Therapy:  Appt start time: 2:05  End time:  3:25  Patient was seen on 01/06/2017 for Pre-Operative Nutrition Assessment. Assessment and [facility] on 01/06/2017.   Pt expectation of surgery: to be healthy, not winded when taking stairs, improve bone and joint pain, does not want to take insulin  Pt expectation of Dietitian: honesty when getting off-track, being awareness, be straight-forward  Start weight at [facility]: 311.0 BMI: 52.56  Co-morbidities: GERD, hypertension, asthma, osteoarthritis, PCOS Prefers to be called "Monica BernJean". Pt is talkative. Pt states she loves coffee.   Per insurance, pt needs 0 SWL visits prior to surgery.    24 hr Dietary Recall: First Meal: protein shake Snack: 1/2 piece of fruit Second Meal: 3 oz chicken, baked chips, cheese stick Snack: none Third Meal: pork chops, baked potato, corn Snack: none Beverages: water, vitamin water, crystal light, coffee  Encouraged to engage in 75 minutes of moderate physical activity including cardiovascular and weight baring weekly  Handouts given during visit include:  . Pre-Op Goals . Bariatric Surgery Protein Shakes . Vitamin and Mineral Recommendations  During the appointment today the following Pre-Op Goals were reviewed with the patient: . Maintain or lose weight as instructed by your surgeon . Make healthy food choices . Begin to limit portion sizes . Limited concentrated sugars and fried foods . Keep fat/sugar in the single digits per serving on          food labels . Practice CHEWING your food  (aim for 30 chews per bite or until applesauce consistency) . Practice not drinking 15 minutes before, during, and 30 minutes after each meal/snack . Avoid all carbonated beverages  . Avoid/limit caffeinated beverages  . Avoid all sugar-sweetened beverages . Consume 3  meals per day; eat every 3-5 hours . Make a list of non-food related activities . Aim for 64-100 ounces of FLUID daily  . Aim for at least 60-80 grams of PROTEIN daily . Look for a liquid protein source that contain ?15 g protein and ?5 g carbohydrate  (ex: shakes, drinks, shots) . Physical activity is an important part of a healthy lifestyle so keep it moving!  Follow diet recommendations listed below Energy and Macronutrient Recommendations: Calories: 1800 Carbohydrate: 200 Protein: 135 Fat: 50  Demonstrated degree of understanding via:  Teach Back   Teaching Method Utilized:  Visual Auditory Hands on  Barriers to learning/adherence to lifestyle change: none  Patient to call the [facility] to enroll in Pre-Op and Post-Op Nutrition Education when surgery date is scheduled.

## 2017-01-09 ENCOUNTER — Ambulatory Visit (HOSPITAL_COMMUNITY): Payer: 59

## 2017-01-15 ENCOUNTER — Ambulatory Visit (HOSPITAL_COMMUNITY): Payer: 59

## 2017-01-19 ENCOUNTER — Encounter: Payer: 59 | Attending: General Surgery | Admitting: Registered"

## 2017-01-19 DIAGNOSIS — Z888 Allergy status to other drugs, medicaments and biological substances status: Secondary | ICD-10-CM | POA: Diagnosis not present

## 2017-01-19 DIAGNOSIS — Z9889 Other specified postprocedural states: Secondary | ICD-10-CM | POA: Insufficient documentation

## 2017-01-19 DIAGNOSIS — E282 Polycystic ovarian syndrome: Secondary | ICD-10-CM | POA: Diagnosis not present

## 2017-01-19 DIAGNOSIS — Z90710 Acquired absence of both cervix and uterus: Secondary | ICD-10-CM | POA: Insufficient documentation

## 2017-01-19 DIAGNOSIS — Z87442 Personal history of urinary calculi: Secondary | ICD-10-CM | POA: Insufficient documentation

## 2017-01-19 DIAGNOSIS — Z87891 Personal history of nicotine dependence: Secondary | ICD-10-CM | POA: Diagnosis not present

## 2017-01-19 DIAGNOSIS — E162 Hypoglycemia, unspecified: Secondary | ICD-10-CM | POA: Diagnosis not present

## 2017-01-19 DIAGNOSIS — Z803 Family history of malignant neoplasm of breast: Secondary | ICD-10-CM | POA: Insufficient documentation

## 2017-01-19 DIAGNOSIS — Z8249 Family history of ischemic heart disease and other diseases of the circulatory system: Secondary | ICD-10-CM | POA: Insufficient documentation

## 2017-01-19 DIAGNOSIS — Z8719 Personal history of other diseases of the digestive system: Secondary | ICD-10-CM | POA: Diagnosis not present

## 2017-01-19 DIAGNOSIS — I10 Essential (primary) hypertension: Secondary | ICD-10-CM | POA: Diagnosis not present

## 2017-01-19 DIAGNOSIS — Z8041 Family history of malignant neoplasm of ovary: Secondary | ICD-10-CM | POA: Diagnosis not present

## 2017-01-19 DIAGNOSIS — J45909 Unspecified asthma, uncomplicated: Secondary | ICD-10-CM | POA: Insufficient documentation

## 2017-01-19 DIAGNOSIS — Z79899 Other long term (current) drug therapy: Secondary | ICD-10-CM | POA: Insufficient documentation

## 2017-01-19 DIAGNOSIS — E119 Type 2 diabetes mellitus without complications: Secondary | ICD-10-CM | POA: Insufficient documentation

## 2017-01-19 DIAGNOSIS — Z8042 Family history of malignant neoplasm of prostate: Secondary | ICD-10-CM | POA: Insufficient documentation

## 2017-01-19 DIAGNOSIS — E669 Obesity, unspecified: Secondary | ICD-10-CM

## 2017-01-19 DIAGNOSIS — R0683 Snoring: Secondary | ICD-10-CM | POA: Insufficient documentation

## 2017-01-19 DIAGNOSIS — R609 Edema, unspecified: Secondary | ICD-10-CM | POA: Diagnosis not present

## 2017-01-19 DIAGNOSIS — K219 Gastro-esophageal reflux disease without esophagitis: Secondary | ICD-10-CM | POA: Insufficient documentation

## 2017-01-19 DIAGNOSIS — Z823 Family history of stroke: Secondary | ICD-10-CM | POA: Insufficient documentation

## 2017-01-19 DIAGNOSIS — Z8261 Family history of arthritis: Secondary | ICD-10-CM | POA: Diagnosis not present

## 2017-01-19 DIAGNOSIS — Z713 Dietary counseling and surveillance: Secondary | ICD-10-CM | POA: Diagnosis not present

## 2017-01-19 DIAGNOSIS — Z811 Family history of alcohol abuse and dependence: Secondary | ICD-10-CM | POA: Insufficient documentation

## 2017-01-20 NOTE — Progress Notes (Signed)
  Pre-Operative Nutrition Class:  Appt start time: 8864   End time:  1830.  Patient was seen on 01/19/2017 for Pre-Operative Bariatric Surgery Education at the [facility].   Surgery date: TBD Surgery type: RYGB Start weight at [facility]: 311.0 Weight today: 311.3   Samples given per MNT protocol. Patient educated on appropriate usage: Bariatric Advantage Multivitamin Lot # [id number] Exp: 09/2017  Bariatric Advantage Calcium Citrate (chocolate) Lot # [id number] Exp: 12/11/2017  Bariatric Advantage Calcium Citrate (tropical orange) Lot # [id number] Exp: 11/15/2017  [place]jury Protein Shake Lot # [id number] Exp: 08/02/2017    The following the learning objectives were met by the patient during this course:  Identify Pre-Op Dietary Goals and will begin 2 weeks pre-operatively  Identify appropriate sources of fluids and proteins   State protein recommendations and appropriate sources pre and post-operatively  Identify Post-Operative Dietary Goals and will follow for 2 weeks post-operatively  Identify appropriate multivitamin and calcium sources  Describe the need for physical activity post-operatively and will follow MD recommendations  State when to call healthcare provider regarding medication questions or post-operative complications  Handouts given during class include:  Pre-Op Bariatric Surgery Diet Handout  Protein Shake Handout  Post-Op Bariatric Surgery Nutrition Handout  BELT Program Information Flyer  Support Group Information Flyer  [facility] Bariatric Supplements Price List  Follow-Up Plan: Patient will follow-up at [facility] 2 weeks post operatively for diet advancement per MD.

## 2017-02-05 ENCOUNTER — Telehealth: Payer: Self-pay | Admitting: Skilled Nursing Facility1

## 2017-02-05 NOTE — Telephone Encounter (Signed)
LVM to return phone call

## 2017-02-09 ENCOUNTER — Telehealth: Payer: Self-pay | Admitting: Registered"

## 2017-02-09 NOTE — Telephone Encounter (Signed)
Hi Monica Hernandez,  I hope you're doing well. Yes, I have heard of these vitamins. They do not meet all of the vitamin and mineral recommendations for post-bariatric surgery patients. After surgery your vitamin and mineral needs will increase because the digestive system has changed and to prevent deficiencies it is important to take a multi-vitamin that meets recommendations. The chewable bariatric-specific multivitamins listed on the Vitamin and Mineral Recommendations sheet provided at the initial appointment are good options. Testpo is lacking in thiamin, Vitamin E, and Copper. They also do not have enough Calcium or Vitamin D.   I hope this helps. Let me know if you have any more questions or concerns. Have a great evening!  Thank you,  Mirian Capuchinonetta Floyd, RD, LDN [facility]  Nutrition [facility] Diabetes Education Services Registered Dietitian I Direct Dial: [phone]  Fax: [phone] Website: [email or web address]    From: [email or web address]>  Sent: Sunday, February 08, 2017 8:23 AM To: Adela LankFloyd, [email or web address]> Subject: [External Email]Bariatric vitamins   *Caution - External Email* Lupita LeashDonna have you ever heard of these vitamins I have been getting emails bout these since I signed up for the bariatric program? Any thoughts?   [email or web address]  Thanks  Alonna MiniumNorma Sampath [phone]

## 2017-02-10 ENCOUNTER — Telehealth: Payer: Self-pay | Admitting: Registered"

## 2017-02-10 NOTE — Telephone Encounter (Signed)
Hi Sheronda,   You are welcome! Ok, sounds good. We look forward to seeing you soon. Have a great day!  Thank you, Isidore Moos, RD, [place] Registered Dietitian I Direct Dial: [phone]  Fax: [phone] Website: [email or web address]    From: Renato Gails [email or web address]>  Sent: Monday, February 09, 2017 6:26 PM To: Tyrone Nine, Donetta [email or web address]> Subject: [External Email]RE: Bariatric vitamins  *Caution - External Email* Donetta    Thank you I was jUst inquiring, I noticed that some of the vitamins weren't the same.  Thank you I haven't had my surgery yet, I just met with psychologist.  I will keep in touch.  Jannet Askew

## 2017-02-16 ENCOUNTER — Telehealth: Payer: 59 | Admitting: Family

## 2017-02-16 DIAGNOSIS — J019 Acute sinusitis, unspecified: Secondary | ICD-10-CM

## 2017-02-16 MED ORDER — AMOXICILLIN-POT CLAVULANATE 875-125 MG PO TABS: 1.0000 | ORAL_TABLET | Freq: Two times a day (BID) | ORAL | 0 refills | Status: DC

## 2017-02-16 NOTE — Progress Notes (Signed)

## 2017-03-11 ENCOUNTER — Ambulatory Visit: Payer: Self-pay | Admitting: General Surgery

## 2017-03-11 MED FILL — LANSOPRAZOLE DR 30 MG CAP: 30 | 90 days supply | Qty: 90 | Fill #3

## 2017-03-11 MED FILL — FUROSEMIDE 20 MG TABLET: 20 | 28 days supply | Qty: 12 | Fill #0

## 2017-03-11 MED FILL — DICYCLOMINE 10 MG CAPSULE: 10 | 30 days supply | Qty: 90 | Fill #2

## 2017-03-20 MED FILL — ONDANSETRON HCL 4 MG TABLET: 4 | 5 days supply | Qty: 15 | Fill #0

## 2017-03-23 MED FILL — traMADol HCL 50 MG TABS: 50 | 8 days supply | Qty: 30 | Fill #0

## 2017-03-24 ENCOUNTER — Other Ambulatory Visit: Payer: Self-pay | Admitting: *Deleted

## 2017-03-24 ENCOUNTER — Telehealth: Payer: 59 | Admitting: Family

## 2017-03-24 DIAGNOSIS — R21 Rash and other nonspecific skin eruption: Secondary | ICD-10-CM

## 2017-03-24 NOTE — Progress Notes (Signed)
Based on what you shared with me it looks like you have a serious condition that should be evaluated in a face to face office visit.  NOTE: If you entered your credit card information for this eVisit, you will not be charged. You may see a "hold" on your card for the $30 but that hold will drop off and you will not have a charge processed.  If you are having a true medical emergency please call 911.  If you need an urgent face to face visit, [facility] has four urgent care centers for your convenience.  If you need care fast and have a high deductible or no insurance consider:   [email or web address] to reserve your spot online an avoid wait times  [facility] [address], [address] [place], [place] [address] 8 am to 8 pm Monday-Friday 10 am to 4 pm Saturday-Sunday *Across the street from [employer]  [facility]  [address] [place], [address] 8 am to 5 pm Monday-Friday * In the [facility] the [facility]   The following sites will take your  insurance:  . [facility]  [phone] Get Driving Directions Find a Provider at this Location  [address] [place], [place] [address] . 10 am to 8 pm Monday-Friday . 12 pm to 8 pm Saturday-Sunday   . [facility]  [phone] Get Driving Directions Find a Provider at this Location  [address], [address] [place], [place] [address] . 8 am to 8 pm Monday-Friday . 9 am to 6 pm Saturday . 11 am to 6 pm Sunday   .  Urgent Care at [facility]  [phone] Get Driving Directions  [address] [place], [place] [address] . 8 am to 8 pm Monday-Friday . 8 am to 4 pm Saturday-Sunday   Your e-visit answers were reviewed by a board certified advanced clinical practitioner to complete your personal care plan.  Thank you for using e-Visits.  

## 2017-03-24 NOTE — Patient Outreach (Signed)
[facility]) Care Management  03/24/2017  Monica Hernandez [birth year] [id number]   Subjective: Telephone call to patient's home / mobile number, spoke with patient, and HIPAA verified.  [facility]ed [facility] Transition of care follow up, preoperative call follow up, patient voiced understanding, and is in agreement to both types of follow up.   Patient states she is currently at work returning from lunch break and requested call back.    Objective: Per KPN (Knowledge Performance Now, point of care tool) and chart review, patient [facility] WITH UPPER ENDOSCOPY.   Patient also has a history of mitral valve prolapse.    Assessment: Received [facility] Preoperative / Transition of care referral on 03/09/17.  Preoperative call follow up pending patient contact.      Plan: RNCM will call patient for 2nd telephone outreach attempt, preoperative call follow up, within 10 business days if no return call.     Octavie Westerhold H. Gardiner Barefootooper RN, BSN, CCM [facility] Telephonic CM Phone: [phone] Fax: [phone]

## 2017-03-25 ENCOUNTER — Other Ambulatory Visit: Payer: Self-pay | Admitting: *Deleted

## 2017-03-25 ENCOUNTER — Ambulatory Visit: Payer: Self-pay | Admitting: *Deleted

## 2017-03-25 NOTE — Patient Outreach (Signed)
[facility]) Care Management  03/25/2017  Monica Hernandez [birth year] [id number]   Subjective: Telephone call to patient's home  / mobile number, no answer, left HIPAA compliant voicemail message, and requested call back.    Objective: Per KPN (Knowledge Performance Now, point of care tool) and chart review, patient [facility] GASTRIC BY[facility] WITH UPPER ENDOSCOPY.   Patient also has a history of mitral valve prolapse.    Assessment: Received UMR Preoperative / Transition of care referral on 03/09/17.  Preoperative call follow up pending patient contact.      Plan: RNCM will call patient for 3rd telephone outreach attempt, preoperative call follow up, within 10 business days if no return call.     Deontre Allsup H. Gardiner Barefootooper RN, BSN, CCM [facility] Care [facility] Telephonic CM Phone: [phone] Fax: [phone]

## 2017-03-25 NOTE — Patient Instructions (Addendum)
Jacquelynn Creeorma Jean Stonehouse  03/25/2017   Your procedure is scheduled on: 03-31-17  [facility] Main  Entr[facility]   Follow signs to Short Stay on first floor at 530 AM   Call this number if you have problems the morning of surgery [phone]     Remember: NO SOLID FOOD AFTER MIDNIGHT THE NIGHT PRIOR TO SURGERY. NOTHING BY MOUTH EXCEPT CLEAR LIQUIDS UNTIL 3 HOURS PRIOR TO SCHEDULED SURGERY. PLEASE FINISH ENSURE DRINK PER SURGEON ORDER 3 HOURS PRIOR TO SCHEDULED SURGERY TIME WHICH NEEDS TO BE COMPLETED AT ____4:30AM____. SEE CLEAR LIQUID DIET BELOW     Take these medicines the morning of surgery with A SIP OF WATER: lansoprazole(prevacid), inhaler if needed (may bring)                                 You may not have any metal on your body including hair pins and              piercings  Do not wear jewelry, make-up, lotions, powders or perfumes, deodorant             Do not wear nail polish.  Do not shave  48 hours prior to surgery.         Do not bring valuables to the hospital. [facility] IS NOT             RESPONSIBLE   FOR VALUABLES.  Contacts, dentures or bridgework may not be worn into surgery.  Leave suitcase in the car. After surgery it may be brought to your room.                 Please read over the following fact sheets you were given: _____________________________________________________________________     CLEAR LIQUID DIET   Foods Allowed                                                                     Foods Excluded  Coffee and tea, regular and decaf                             liquids that you cannot  Plain Jell-O in any flavor                                             see through such as: Fruit ices (not with fruit pulp)                                     milk, soups, orange juice  Iced Popsicles                                    All solid food Carbonated beverages, regular and diet  Cranberry,  grape and apple juices Sports drinks like Gatorade Lightly seasoned clear broth or consume(fat free) Sugar, honey syrup  Sample Menu Breakfast                                Lunch                                     Supper Cranberry juice                    Beef broth                            Chicken broth Jell-O                                     Grape juice                           Apple juice Coffee or tea                        Jell-O                                      Popsicle                                                Coffee or tea                        Coffee or tea  _____________________________________________________________________  [facility] - Preparing for Surgery Before surgery, you can play an important role.  Because skin is not sterile, your skin needs to be as free of germs as possible.  You can reduce the number of germs on your skin by washing with CHG (chlorahexidine gluconate) soap before surgery.  CHG is an antiseptic cleaner which kills germs and bonds with the skin to continue killing germs even after washing. Please DO NOT use if you have an allergy to CHG or antibacterial soaps.  If your skin becomes reddened/irritated stop using the CHG and inform your nurse when you arrive at Short Stay. Do not shave (including legs and underarms) for at least 48 hours prior to the first CHG shower.  You may shave your face/neck. Please follow these instructions carefully:  1.  Shower with CHG Soap the night before surgery and the  morning of Surgery.  2.  If you choose to wash your hair, wash your hair first as usual with your  normal  shampoo.  3.  After you shampoo, rinse your hair and body thoroughly to remove the  shampoo.                           4.  Use CHG as you would any other liquid soap.  You can apply chg directly  to the skin and wash  Gently with a scrungie or clean washcloth.  5.  Apply the CHG Soap to your body ONLY FROM THE NECK  DOWN.   Do not use on face/ open                           Wound or open sores. Avoid contact with eyes, ears mouth and genitals (private parts).                       Wash face,  Genitals (private parts) with your normal soap.             6.  Wash thoroughly, paying special attention to the area where your surgery  will be performed.  7.  Thoroughly rinse your body with warm water from the neck down.  8.  DO NOT shower/wash with your normal soap after using and rinsing off  the CHG Soap.                9.  Pat yourself dry with a clean towel.            10.  Wear clean pajamas.            11.  Place clean sheets on your bed the night of your first shower and do not  sleep with pets. Day of Surgery : Do not apply any lotions/deodorants the morning of surgery.  Please wear clean clothes to the hospital/surgery center.  FAILURE TO FOLLOW THESE INSTRUCTIONS MAY RESULT IN THE CANCELLATION OF YOUR SURGERY PATIENT SIGNATURE_________________________________  NURSE SIGNATURE__________________________________  ________________________________________________________________________   Adam Phenix  An incentive spirometer is a tool that can help keep your lungs clear and active. This tool measures how well you are filling your lungs with each breath. Taking long deep breaths may help reverse or decrease the chance of developing breathing (pulmonary) problems (especially infection) following:  A long period of time when you are unable to move or be active. BEFORE THE PROCEDURE   If the spirometer includes an indicator to show your best effort, your nurse or respiratory therapist will set it to a desired goal.  If possible, sit up straight or lean slightly forward. Try not to slouch.  Hold the incentive spirometer in an upright position. INSTRUCTIONS FOR USE  1. Sit on the edge of your bed if possible, or sit up as far as you can in bed or on a chair. 2. Hold the incentive spirometer in  an upright position. 3. Breathe out normally. 4. Place the mouthpiece in your mouth and seal your lips tightly around it. 5. Breathe in slowly and as deeply as possible, raising the piston or the ball toward the top of the column. 6. Hold your breath for 3-5 seconds or for as long as possible. Allow the piston or ball to fall to the bottom of the column. 7. Remove the mouthpiece from your mouth and breathe out normally. 8. Rest for a few seconds and repeat Steps 1 through 7 at least 10 times every 1-2 hours when you are awake. Take your time and take a few normal breaths between deep breaths. 9. The spirometer may include an indicator to show your best effort. Use the indicator as a goal to work toward during each repetition. 10. After each set of 10 deep breaths, practice coughing to be sure your lungs are clear. If you have an incision (the cut made at the time of  surgery), support your incision when coughing by placing a pillow or rolled up towels firmly against it. Once you are able to get out of bed, walk around indoors and cough well. You may stop using the incentive spirometer when instructed by your caregiver.  RISKS AND COMPLICATIONS  Take your time so you do not get dizzy or light-headed.  If you are in pain, you may need to take or ask for pain medication before doing incentive spirometry. It is harder to take a deep breath if you are having pain. AFTER USE  Rest and breathe slowly and easily.  It can be helpful to keep track of a log of your progress. Your caregiver can provide you with a simple table to help with this. If you are using the spirometer at home, follow these instructions: SEEK MEDICAL CARE IF:   You are having difficultly using the spirometer.  You have trouble using the spirometer as often as instructed.  Your pain medication is not giving enough relief while using the spirometer.  You develop fever of 100.5 F (38.1 C) or higher. SEEK IMMEDIATE MEDICAL CARE  IF:   You cough up bloody sputum that had not been present before.  You develop fever of 102 F (38.9 C) or greater.  You develop worsening pain at or near the incision site. MAKE SURE YOU:   Understand these instructions.  Will watch your condition.  Will get help right away if you are not doing well or get worse. Document Released: 07/07/2006 Document Revised: 05/19/2011 Document Reviewed: 09/07/2006 ExitCare Patient Information 2014 ExitCare, [place].   ________________________________________________________________________  WHAT IS A BLOOD TRANSFUSION? Blood Transfusion Information  A transfusion is the replacement of blood or some of its parts. Blood is made up of multiple cells which provide different functions.  Red blood cells carry oxygen and are used for blood loss replacement.  White blood cells fight against infection.  Platelets control bleeding.  Plasma helps clot blood.  Other blood products are available for specialized needs, such as hemophilia or other clotting disorders. BEFORE THE TRANSFUSION  Who gives blood for transfusions?   Healthy volunteers who are fully evaluated to make sure their blood is safe. This is blood bank blood. Transfusion therapy is the safest it has ever been in the practice of medicine. Before blood is taken from a donor, a complete history is taken to make sure that person has no history of diseases nor engages in risky social behavior (examples are intravenous drug use or sexual activity with multiple partners). The donor's travel history is screened to minimize risk of transmitting infections, such as malaria. The donated blood is tested for signs of infectious diseases, such as HIV and hepatitis. The blood is then tested to be sure it is compatible with you in order to minimize the chance of a transfusion reaction. If you or a relative donates blood, this is often done in anticipation of surgery and is not appropriate for emergency  situations. It takes many days to process the donated blood. RISKS AND COMPLICATIONS Although transfusion therapy is very safe and saves many lives, the main dangers of transfusion include:   Getting an infectious disease.  Developing a transfusion reaction. This is an allergic reaction to something in the blood you were given. Every precaution is taken to prevent this. The decision to have a blood transfusion has been considered carefully by your caregiver before blood is given. Blood is not given unless the benefits outweigh the risks. AFTER THE  TRANSFUSION  Right after receiving a blood transfusion, you will usually feel much better and more energetic. This is especially true if your red blood cells have gotten low (anemic). The transfusion raises the level of the red blood cells which carry oxygen, and this usually causes an energy increase.  The nurse administering the transfusion will monitor you carefully for complications. HOME CARE INSTRUCTIONS  No special instructions are needed after a transfusion. You may find your energy is better. Speak with your caregiver about any limitations on activity for underlying diseases you may have. SEEK MEDICAL CARE IF:   Your condition is not improving after your transfusion.  You develop redness or irritation at the intravenous (IV) site. SEEK IMMEDIATE MEDICAL CARE IF:  Any of the following symptoms occur over the next 12 hours:  Shaking chills.  You have a temperature by mouth above 102 F (38.9 C), not controlled by medicine.  Chest, back, or muscle pain.  People around you feel you are not acting correctly or are confused.  Shortness of breath or difficulty breathing.  Dizziness and fainting.  You get a rash or develop hives.  You have a decrease in urine output.  Your urine turns a dark color or changes to pink, red, or brown. Any of the following symptoms occur over the next 10 days:  You have a temperature by mouth above  102 F (38.9 C), not controlled by medicine.  Shortness of breath.  Weakness after normal activity.  The white part of the eye turns yellow (jaundice).  You have a decrease in the amount of urine or are urinating less often.  Your urine turns a dark color or changes to pink, red, or brown. Document Released: 02/22/2000 Document Revised: 05/19/2011 Document Reviewed: 10/11/2007 [facility] Patient Information 2014 [place], [place].  _______________________________________________________________________

## 2017-03-25 NOTE — Progress Notes (Signed)
ECHO 08-25-16 epic   CXR 08-05-16 epic   EKG 05-01-16 epic

## 2017-03-27 ENCOUNTER — Other Ambulatory Visit: Payer: Self-pay

## 2017-03-27 ENCOUNTER — Encounter: Payer: Self-pay | Admitting: *Deleted

## 2017-03-27 ENCOUNTER — Encounter (HOSPITAL_COMMUNITY): Payer: Self-pay

## 2017-03-27 ENCOUNTER — Encounter (HOSPITAL_COMMUNITY)
Admission: RE | Admit: 2017-03-27 | Discharge: 2017-03-27 | Disposition: A | Discharge status: Home / Self Care | Payer: 59 | Source: Ambulatory Visit | Attending: General Surgery | Admitting: General Surgery

## 2017-03-27 ENCOUNTER — Ambulatory Visit: Payer: Self-pay | Admitting: *Deleted

## 2017-03-27 ENCOUNTER — Ambulatory Visit: Payer: 59 | Admitting: Physician Assistant

## 2017-03-27 ENCOUNTER — Other Ambulatory Visit: Payer: Self-pay | Admitting: *Deleted

## 2017-03-27 DIAGNOSIS — Z6841 Body Mass Index (BMI) 40.0 and over, adult: Secondary | ICD-10-CM | POA: Insufficient documentation

## 2017-03-27 DIAGNOSIS — Z01812 Encounter for preprocedural laboratory examination: Secondary | ICD-10-CM | POA: Insufficient documentation

## 2017-03-27 DIAGNOSIS — E669 Obesity, unspecified: Secondary | ICD-10-CM | POA: Diagnosis not present

## 2017-03-27 HISTORY — DX: Nausea with vomiting, unspecified: R11.2

## 2017-03-27 HISTORY — DX: Other specified postprocedural states: Z98.890

## 2017-03-27 HISTORY — DX: Prediabetes: R73.03

## 2017-03-27 HISTORY — DX: Personal history of urinary calculi: Z87.442

## 2017-03-27 HISTORY — DX: Nausea with vomiting, unspecified: Z98.890

## 2017-03-27 LAB — COMPREHENSIVE METABOLIC PANEL
ALBUMIN: 4.1 g/dL (ref 3.5–5.0)
ALT: 13 U/L — ABNORMAL LOW (ref 14–54)
AST: 18 U/L (ref 15–41)
Alkaline Phosphatase: 67 U/L (ref 38–126)
Anion gap: 7 (ref 5–15)
BUN: 19 mg/dL (ref 6–20)
CHLORIDE: 105 mmol/L (ref 101–111)
CO2: 27 mmol/L (ref 22–32)
Calcium: 9.3 mg/dL (ref 8.9–10.3)
Creatinine, Ser: 0.81 mg/dL (ref 0.44–1.00)
GFR calc Af Amer: >60 mL/min (ref >60)
GFR calc non Af Amer: >60 mL/min (ref >60)
GLUCOSE: 99 mg/dL (ref 65–99)
Potassium: 4.3 mmol/L (ref 3.5–5.1)
SODIUM: 139 mmol/L (ref 135–145)
Total Bilirubin: 0.5 mg/dL (ref 0.3–1.2)
Total Protein: 7.5 g/dL (ref 6.5–8.1)

## 2017-03-27 LAB — HEMOGLOBIN A1C
HEMOGLOBIN A1C: 5.7 % — AB (ref 4.8–5.6)
MEAN PLASMA GLUCOSE: 116.89 mg/dL

## 2017-03-27 NOTE — Patient Outreach (Signed)
[facility]) Care Management  03/27/2017  Monica Hernandez [birth year] [id number]   Subjective:Telephone call to patient's home  / mobile number, no answer, left HIPAA compliant voicemail message, and requested call back.    Objective:Per KPN (Knowledge Performance Now, point of care tool) and chart review,[facility]PP[facility] ENDOS[facility]. Patient also has a history of mitral valve prolapse.    Assessment: Received UMR Preoperative / Transition of care referral on 03/09/17.Preoperative call follow up pending patient contact.     Plan:RNCM will send unsuccessful outreach  letter, [facility] pamph[facility], and proceed with case closure, within 10 business days if no return call.    Andrei Mccook H. Gardiner Barefootooper RN, BSN, CCM [facility] Care Management [facility] Telephonic CM Phone: [phone] Fax: [phone]

## 2017-03-28 LAB — CBC WITH DIFFERENTIAL/PLATELET
BASOS ABS: 0 10*3/uL (ref 0.0–0.1)
BASOS PCT: 0 %
Eosinophils Absolute: 0.3 10*3/uL (ref 0.0–0.7)
Eosinophils Relative: 3 %
HEMATOCRIT: 38.1 % (ref 36.0–46.0)
HEMOGLOBIN: 12.1 g/dL (ref 12.0–15.0)
LYMPHS PCT: 33 %
Lymphs Abs: 2.9 10*3/uL (ref 0.7–4.0)
MCH: 28.3 pg (ref 26.0–34.0)
MCHC: 31.8 g/dL (ref 30.0–36.0)
MCV: 89.2 fL (ref 78.0–100.0)
Monocytes Absolute: 0.5 10*3/uL (ref 0.1–1.0)
Monocytes Relative: 6 %
NEUTROS ABS: 5 10*3/uL (ref 1.7–7.7)
NEUTROS PCT: 58 %
PLATELETS: 288 10*3/uL (ref 150–400)
RBC: 4.27 MIL/uL (ref 3.87–5.11)
RDW: 14.7 % (ref 11.5–15.5)
WBC: 8.8 10*3/uL (ref 4.0–10.5)

## 2017-03-28 LAB — ABO/RH: ABO/RH(D): O POS

## 2017-03-30 NOTE — Anesthesia Preprocedure Evaluation (Addendum)
Anesthesia Evaluation  Patient identified by MRN, date of birth, ID band Patient awake    Reviewed: Allergy & Precautions, NPO status , Patient's Chart, lab work & pertinent test results  Airway Mallampati: I  TM Distance: >3 FB Neck ROM: Full    Dental  (+) Dental Advisory Given, Edentulous Upper   Pulmonary asthma , former smoker,    Pulmonary exam normal breath sounds clear to auscultation       Cardiovascular negative cardio ROS Normal cardiovascular exam Rhythm:Regular Rate:Normal     Neuro/Psych negative neurological ROS  negative psych ROS   GI/Hepatic Neg liver ROS, GERD  Medicated and Controlled,  Endo/Other  Morbid obesityPre-DM  Renal/GU Nephrolithiasis  negative genitourinary   Musculoskeletal negative musculoskeletal ROS (+)   Abdominal (+) + obese,   Peds  Hematology  (+) anemia ,   Anesthesia Other Findings Hx nausea during C/S under epidural  Reproductive/Obstetrics                            Anesthesia Physical Anesthesia Plan  ASA: III  Anesthesia Plan: General   Post-op Pain Management:    Induction: Intravenous  PONV Risk Score and Plan: 4 or greater and Treatment may vary due to age or medical condition, Ondansetron, Dexamethasone, Midazolam and Scopolamine patch - Pre-op  Airway Management Planned: Oral ETT  Additional Equipment: None  Intra-op Plan:   Post-operative Plan: Extubation in OR  Informed Consent: I have reviewed the patients History and Physical, chart, labs and discussed the procedure including the risks, benefits and alternatives for the proposed anesthesia with the patient or authorized representative who has indicated his/her understanding and acceptance.   Dental advisory given  Plan Discussed with: CRNA  Anesthesia Plan Comments:         Anesthesia Quick Evaluation

## 2017-03-31 ENCOUNTER — Encounter (HOSPITAL_COMMUNITY)
Admission: RE | Disposition: A | Discharge status: Home / Self Care | Payer: Self-pay | Source: Ambulatory Visit | Attending: General Surgery

## 2017-03-31 ENCOUNTER — Other Ambulatory Visit: Payer: Self-pay

## 2017-03-31 ENCOUNTER — Inpatient Hospital Stay (HOSPITAL_COMMUNITY)
Admission: RE | Admit: 2017-03-31 | Discharge: 2017-04-01 | DRG: 621 | Disposition: A | Discharge status: Home / Self Care | Payer: 59 | Source: Ambulatory Visit | Attending: General Surgery | Admitting: General Surgery

## 2017-03-31 ENCOUNTER — Inpatient Hospital Stay (HOSPITAL_COMMUNITY): Payer: 59 | Admitting: Anesthesiology

## 2017-03-31 ENCOUNTER — Encounter (HOSPITAL_COMMUNITY): Payer: Self-pay

## 2017-03-31 DIAGNOSIS — Z87891 Personal history of nicotine dependence: Secondary | ICD-10-CM

## 2017-03-31 DIAGNOSIS — R7303 Prediabetes: Secondary | ICD-10-CM | POA: Diagnosis present

## 2017-03-31 DIAGNOSIS — Z90710 Acquired absence of both cervix and uterus: Secondary | ICD-10-CM | POA: Diagnosis not present

## 2017-03-31 DIAGNOSIS — Z881 Allergy status to other antibiotic agents status: Secondary | ICD-10-CM | POA: Diagnosis not present

## 2017-03-31 DIAGNOSIS — Z91013 Allergy to seafood: Secondary | ICD-10-CM | POA: Diagnosis not present

## 2017-03-31 DIAGNOSIS — Z87442 Personal history of urinary calculi: Secondary | ICD-10-CM

## 2017-03-31 DIAGNOSIS — Z808 Family history of malignant neoplasm of other organs or systems: Secondary | ICD-10-CM

## 2017-03-31 DIAGNOSIS — Z8249 Family history of ischemic heart disease and other diseases of the circulatory system: Secondary | ICD-10-CM

## 2017-03-31 DIAGNOSIS — Z882 Allergy status to sulfonamides status: Secondary | ICD-10-CM | POA: Diagnosis not present

## 2017-03-31 DIAGNOSIS — Z888 Allergy status to other drugs, medicaments and biological substances status: Secondary | ICD-10-CM

## 2017-03-31 DIAGNOSIS — K449 Diaphragmatic hernia without obstruction or gangrene: Secondary | ICD-10-CM | POA: Diagnosis present

## 2017-03-31 DIAGNOSIS — Z825 Family history of asthma and other chronic lower respiratory diseases: Secondary | ICD-10-CM

## 2017-03-31 DIAGNOSIS — Z79899 Other long term (current) drug therapy: Secondary | ICD-10-CM

## 2017-03-31 DIAGNOSIS — Z823 Family history of stroke: Secondary | ICD-10-CM

## 2017-03-31 DIAGNOSIS — D649 Anemia, unspecified: Secondary | ICD-10-CM | POA: Diagnosis present

## 2017-03-31 DIAGNOSIS — Z6841 Body Mass Index (BMI) 40.0 and over, adult: Secondary | ICD-10-CM

## 2017-03-31 DIAGNOSIS — I10 Essential (primary) hypertension: Secondary | ICD-10-CM | POA: Diagnosis present

## 2017-03-31 DIAGNOSIS — Z801 Family history of malignant neoplasm of trachea, bronchus and lung: Secondary | ICD-10-CM

## 2017-03-31 DIAGNOSIS — K219 Gastro-esophageal reflux disease without esophagitis: Secondary | ICD-10-CM | POA: Diagnosis present

## 2017-03-31 DIAGNOSIS — Z833 Family history of diabetes mellitus: Secondary | ICD-10-CM

## 2017-03-31 HISTORY — PX: GASTRIC ROUX-EN-Y: SHX5262

## 2017-03-31 LAB — HEMOGLOBIN AND HEMATOCRIT, BLOOD
HCT: 36.9 % (ref 36.0–46.0)
HEMOGLOBIN: 12.3 g/dL (ref 12.0–15.0)

## 2017-03-31 LAB — TYPE AND SCREEN
ABO/RH(D): O POS
Antibody Screen: NEGATIVE

## 2017-03-31 SURGERY — LAPAROSCOPIC ROUX-EN-Y GASTRIC BYPASS WITH UPPER ENDOSCOPY
Anesthesia: General

## 2017-03-31 MED ORDER — LIDOCAINE HCL 2 % IJ SOLN
INTRAMUSCULAR | Status: AC
  Filled 2017-03-31: qty 20

## 2017-03-31 MED ORDER — MORPHINE SULFATE (PF) 2 MG/ML IV SOLN
1.0000 mg | INTRAVENOUS | Status: DC | PRN
  Administered 2017-03-31: 2 mg via INTRAVENOUS
  Filled 2017-03-31: qty 1

## 2017-03-31 MED ORDER — APREPITANT 40 MG PO CAPS
40.0000 mg | ORAL_CAPSULE | ORAL | Status: AC
  Administered 2017-03-31: 40 mg via ORAL
  Filled 2017-03-31: qty 1

## 2017-03-31 MED ORDER — PROPOFOL 10 MG/ML IV BOLUS
INTRAVENOUS | Status: DC | PRN
  Administered 2017-03-31: 250 mg via INTRAVENOUS

## 2017-03-31 MED ORDER — FENTANYL CITRATE (PF) 100 MCG/2ML IJ SOLN
INTRAMUSCULAR | Status: AC
  Filled 2017-03-31: qty 2

## 2017-03-31 MED ORDER — SIMETHICONE 80 MG PO CHEW: 80.0000 mg | CHEWABLE_TABLET | Freq: Four times a day (QID) | ORAL | Status: DC | PRN

## 2017-03-31 MED ORDER — ACETAMINOPHEN 500 MG PO TABS
1000.0000 mg | ORAL_TABLET | ORAL | Status: AC
  Administered 2017-03-31: 1000 mg via ORAL
  Filled 2017-03-31: qty 2

## 2017-03-31 MED ORDER — PANTOPRAZOLE SODIUM 40 MG IV SOLR
40.0000 mg | Freq: Every day | INTRAVENOUS | Status: DC
  Administered 2017-03-31: 40 mg via INTRAVENOUS
  Filled 2017-03-31: qty 40

## 2017-03-31 MED ORDER — HYDRALAZINE HCL 20 MG/ML IJ SOLN: 10.0000 mg | INTRAMUSCULAR | Status: DC | PRN

## 2017-03-31 MED ORDER — PREMIER PROTEIN SHAKE
2.0000 [oz_av] | ORAL | Status: DC
  Administered 2017-04-01 (×3): 2 [oz_av] via ORAL

## 2017-03-31 MED ORDER — BUPIVACAINE LIPOSOME 1.3 % IJ SUSP
20.0000 mL | Freq: Once | INTRAMUSCULAR | Status: DC
  Filled 2017-03-31: qty 20

## 2017-03-31 MED ORDER — ENOXAPARIN SODIUM 40 MG/0.4ML ~~LOC~~ SOLN
40.0000 mg | SUBCUTANEOUS | Status: AC
  Administered 2017-03-31: 40 mg via SUBCUTANEOUS
  Filled 2017-03-31: qty 0.4

## 2017-03-31 MED ORDER — PROPOFOL 10 MG/ML IV BOLUS
INTRAVENOUS | Status: AC
  Filled 2017-03-31: qty 40

## 2017-03-31 MED ORDER — OXYCODONE HCL 5 MG/5ML PO SOLN
5.0000 mg | Freq: Once | ORAL | Status: DC | PRN
  Filled 2017-03-31: qty 5

## 2017-03-31 MED ORDER — BUPIVACAINE LIPOSOME 1.3 % IJ SUSP
INTRAMUSCULAR | Status: DC | PRN
  Administered 2017-03-31: 20 mL

## 2017-03-31 MED ORDER — DEXAMETHASONE SODIUM PHOSPHATE 4 MG/ML IJ SOLN: 4.0000 mg | INTRAMUSCULAR | Status: DC

## 2017-03-31 MED ORDER — EPHEDRINE SULFATE-NACL 50-0.9 MG/10ML-% IV SOSY
PREFILLED_SYRINGE | INTRAVENOUS | Status: DC | PRN
  Administered 2017-03-31: 10 mg via INTRAVENOUS

## 2017-03-31 MED ORDER — ENOXAPARIN SODIUM 30 MG/0.3ML ~~LOC~~ SOLN
30.0000 mg | Freq: Two times a day (BID) | SUBCUTANEOUS | Status: DC
  Administered 2017-03-31 – 2017-04-01 (×2): 30 mg via SUBCUTANEOUS
  Filled 2017-03-31 (×2): qty 0.3

## 2017-03-31 MED ORDER — FENTANYL CITRATE (PF) 100 MCG/2ML IJ SOLN
25.0000 ug | INTRAMUSCULAR | Status: DC | PRN
  Administered 2017-03-31: 25 ug via INTRAVENOUS
  Administered 2017-03-31: 50 ug via INTRAVENOUS
  Administered 2017-03-31: 25 ug via INTRAVENOUS

## 2017-03-31 MED ORDER — SUGAMMADEX SODIUM 500 MG/5ML IV SOLN
INTRAVENOUS | Status: AC
Start: 2017-03-31 — End: ?
  Filled 2017-03-31: qty 5

## 2017-03-31 MED ORDER — LACTATED RINGERS IV SOLN
INTRAVENOUS | Status: DC | PRN
  Administered 2017-03-31 (×2): via INTRAVENOUS

## 2017-03-31 MED ORDER — ALBUTEROL SULFATE (2.5 MG/3ML) 0.083% IN NEBU: 2.5000 mg | INHALATION_SOLUTION | RESPIRATORY_TRACT | Status: DC | PRN

## 2017-03-31 MED ORDER — ROCURONIUM BROMIDE 50 MG/5ML IV SOSY
PREFILLED_SYRINGE | INTRAVENOUS | Status: AC
  Filled 2017-03-31: qty 5

## 2017-03-31 MED ORDER — MIDAZOLAM HCL 5 MG/5ML IJ SOLN
INTRAMUSCULAR | Status: DC | PRN
  Administered 2017-03-31: 2 mg via INTRAVENOUS

## 2017-03-31 MED ORDER — ACETAMINOPHEN 160 MG/5ML PO SOLN
650.0000 mg | Freq: Four times a day (QID) | ORAL | Status: DC
  Administered 2017-03-31 – 2017-04-01 (×3): 650 mg via ORAL
  Filled 2017-03-31 (×3): qty 20.3

## 2017-03-31 MED ORDER — SCOPOLAMINE 1 MG/3DAYS TD PT72
1.0000 | MEDICATED_PATCH | TRANSDERMAL | Status: DC
  Administered 2017-03-31: 1.5 mg via TRANSDERMAL
  Filled 2017-03-31: qty 1

## 2017-03-31 MED ORDER — TRAMADOL HCL 50 MG PO TABS: 50.0000 mg | ORAL_TABLET | Freq: Four times a day (QID) | ORAL | Status: DC | PRN

## 2017-03-31 MED ORDER — OXYCODONE HCL 5 MG/5ML PO SOLN: 5.0000 mg | ORAL | Status: DC | PRN

## 2017-03-31 MED ORDER — PROMETHAZINE HCL 25 MG/ML IJ SOLN: 6.2500 mg | INTRAMUSCULAR | Status: DC | PRN

## 2017-03-31 MED ORDER — BUPIVACAINE HCL 0.25 % IJ SOLN
INTRAMUSCULAR | Status: DC | PRN
  Administered 2017-03-31: 20 mL

## 2017-03-31 MED ORDER — CHLORHEXIDINE GLUCONATE 4 % EX LIQD: 60.0000 mL | Freq: Once | CUTANEOUS | Status: DC

## 2017-03-31 MED ORDER — OXYCODONE HCL 5 MG PO TABS: 5.0000 mg | ORAL_TABLET | Freq: Once | ORAL | Status: DC | PRN

## 2017-03-31 MED ORDER — ONDANSETRON HCL 4 MG/2ML IJ SOLN
INTRAMUSCULAR | Status: AC
  Filled 2017-03-31: qty 2

## 2017-03-31 MED ORDER — SUCCINYLCHOLINE CHLORIDE 200 MG/10ML IV SOSY
PREFILLED_SYRINGE | INTRAVENOUS | Status: DC | PRN
  Administered 2017-03-31: 120 mg via INTRAVENOUS

## 2017-03-31 MED ORDER — LIDOCAINE 2% (20 MG/ML) 5 ML SYRINGE
INTRAMUSCULAR | Status: DC | PRN
  Administered 2017-03-31: 100 mg via INTRAVENOUS

## 2017-03-31 MED ORDER — ROCURONIUM BROMIDE 10 MG/ML (PF) SYRINGE
PREFILLED_SYRINGE | INTRAVENOUS | Status: DC | PRN
  Administered 2017-03-31: 10 mg via INTRAVENOUS
  Administered 2017-03-31: 20 mg via INTRAVENOUS
  Administered 2017-03-31: 50 mg via INTRAVENOUS

## 2017-03-31 MED ORDER — GABAPENTIN 300 MG PO CAPS
300.0000 mg | ORAL_CAPSULE | ORAL | Status: AC
  Administered 2017-03-31: 300 mg via ORAL
  Filled 2017-03-31: qty 1

## 2017-03-31 MED ORDER — LIDOCAINE 2% (20 MG/ML) 5 ML SYRINGE
INTRAMUSCULAR | Status: AC
  Filled 2017-03-31: qty 5

## 2017-03-31 MED ORDER — CEFOTETAN DISODIUM-DEXTROSE 2-2.08 GM-%(50ML) IV SOLR
2.0000 g | INTRAVENOUS | Status: AC
  Administered 2017-03-31: 2 g via INTRAVENOUS
  Filled 2017-03-31: qty 50

## 2017-03-31 MED ORDER — DEXAMETHASONE SODIUM PHOSPHATE 10 MG/ML IJ SOLN
INTRAMUSCULAR | Status: AC
Start: 2017-03-31 — End: ?
  Filled 2017-03-31: qty 1

## 2017-03-31 MED ORDER — KETAMINE HCL 10 MG/ML IJ SOLN
INTRAMUSCULAR | Status: AC
  Filled 2017-03-31: qty 1

## 2017-03-31 MED ORDER — DEXAMETHASONE SODIUM PHOSPHATE 10 MG/ML IJ SOLN
INTRAMUSCULAR | Status: DC | PRN
  Administered 2017-03-31: 10 mg via INTRAVENOUS

## 2017-03-31 MED ORDER — PHENYLEPHRINE 40 MCG/ML (10ML) SYRINGE FOR IV PUSH (FOR BLOOD PRESSURE SUPPORT)
PREFILLED_SYRINGE | INTRAVENOUS | Status: AC
  Filled 2017-03-31: qty 10

## 2017-03-31 MED ORDER — GABAPENTIN 250 MG/5ML PO SOLN
200.0000 mg | Freq: Two times a day (BID) | ORAL | Status: DC
  Administered 2017-03-31 – 2017-04-01 (×2): 200 mg via ORAL
  Filled 2017-03-31 (×2): qty 4

## 2017-03-31 MED ORDER — MIDAZOLAM HCL 2 MG/2ML IJ SOLN
INTRAMUSCULAR | Status: AC
Start: 2017-03-31 — End: ?
  Filled 2017-03-31: qty 2

## 2017-03-31 MED ORDER — ONDANSETRON HCL 4 MG/2ML IJ SOLN
INTRAMUSCULAR | Status: DC | PRN
  Administered 2017-03-31: 4 mg via INTRAVENOUS

## 2017-03-31 MED ORDER — ONDANSETRON HCL 4 MG/2ML IJ SOLN: 4.0000 mg | INTRAMUSCULAR | Status: DC | PRN

## 2017-03-31 MED ORDER — PHENYLEPHRINE 40 MCG/ML (10ML) SYRINGE FOR IV PUSH (FOR BLOOD PRESSURE SUPPORT)
PREFILLED_SYRINGE | INTRAVENOUS | Status: DC | PRN
  Administered 2017-03-31 (×3): 80 ug via INTRAVENOUS

## 2017-03-31 MED ORDER — SODIUM CHLORIDE 0.9 % IV SOLN
INTRAVENOUS | Status: DC
  Administered 2017-03-31 – 2017-04-01 (×3): via INTRAVENOUS

## 2017-03-31 MED ORDER — SUGAMMADEX SODIUM 500 MG/5ML IV SOLN
INTRAVENOUS | Status: DC | PRN
  Administered 2017-03-31: 300 mg via INTRAVENOUS

## 2017-03-31 MED ORDER — LIDOCAINE 2% (20 MG/ML) 5 ML SYRINGE
INTRAMUSCULAR | Status: DC | PRN
  Administered 2017-03-31: 1.5 mg/kg/h via INTRAVENOUS

## 2017-03-31 MED ORDER — FENTANYL CITRATE (PF) 100 MCG/2ML IJ SOLN
INTRAMUSCULAR | Status: DC | PRN
  Administered 2017-03-31 (×4): 50 ug via INTRAVENOUS

## 2017-03-31 MED ORDER — BUPIVACAINE HCL (PF) 0.25 % IJ SOLN
INTRAMUSCULAR | Status: AC
  Filled 2017-03-31: qty 30

## 2017-03-31 MED ORDER — LACTATED RINGERS IR SOLN
Status: DC | PRN
  Administered 2017-03-31: 1

## 2017-03-31 MED ORDER — SUCCINYLCHOLINE CHLORIDE 200 MG/10ML IV SOSY
PREFILLED_SYRINGE | INTRAVENOUS | Status: AC
  Filled 2017-03-31: qty 10

## 2017-03-31 MED ORDER — KETAMINE HCL 10 MG/ML IJ SOLN
INTRAMUSCULAR | Status: DC | PRN
  Administered 2017-03-31: 30 mg via INTRAVENOUS
  Administered 2017-03-31: 10 mg via INTRAVENOUS

## 2017-03-31 SURGICAL SUPPLY — 67 items
APPLIER CLIP 5 13 M/L LIGAMAX5 (MISCELLANEOUS)
APPLIER CLIP ROT 10 11.4 M/L (STAPLE)
APPLIER CLIP ROT 13.4 12 LRG (CLIP)
BANDAGE ADH SHEER 1  50/CT (GAUZE/BANDAGES/DRESSINGS) IMPLANT
BENZOIN TINCTURE PRP APPL 2/3 (GAUZE/BANDAGES/DRESSINGS) IMPLANT
BLADE SURG SZ11 CARB STEEL (BLADE) ×3 IMPLANT
CABLE HIGH FREQUENCY MONO STRZ (ELECTRODE) ×3 IMPLANT
CHLORAPREP W/TINT 26ML (MISCELLANEOUS) ×3 IMPLANT
CLIP APPLIE 5 13 M/L LIGAMAX5 (MISCELLANEOUS) IMPLANT
CLIP APPLIE ROT 10 11.4 M/L (STAPLE) IMPLANT
CLIP APPLIE ROT 13.4 12 LRG (CLIP) IMPLANT
CLOSURE WOUND 1/2 X4 (GAUZE/BANDAGES/DRESSINGS)
COVER SURGICAL LIGHT HANDLE (MISCELLANEOUS) ×3 IMPLANT
DEVICE SUTURE ENDOST 10MM (ENDOMECHANICALS) ×3 IMPLANT
DRAIN CHANNEL 19F RND (DRAIN) IMPLANT
DRAIN PENROSE 18X1/4 LTX STRL (WOUND CARE) ×3 IMPLANT
ELECT L-HOOK LAP 45CM DISP (ELECTROSURGICAL) ×3
ELECT PENCIL ROCKER SW 15FT (MISCELLANEOUS) ×3 IMPLANT
ELECTRODE L-HOOK LAP 45CM DISP (ELECTROSURGICAL) ×1 IMPLANT
EVACUATOR SILICONE 100CC (DRAIN) IMPLANT
GAUZE SPONGE 4X4 12PLY STRL (GAUZE/BANDAGES/DRESSINGS) IMPLANT
GAUZE SPONGE 4X4 16PLY XRAY LF (GAUZE/BANDAGES/DRESSINGS) ×3 IMPLANT
GLOVE BIOGEL PI IND STRL 7.0 (GLOVE) ×1 IMPLANT
GLOVE BIOGEL PI INDICATOR 7.0 (GLOVE) ×2
GLOVE SURG SS PI 7.0 STRL IVOR (GLOVE) ×3 IMPLANT
GOWN STRL REUS W/TWL LRG LVL3 (GOWN DISPOSABLE) ×3 IMPLANT
GOWN STRL REUS W/TWL XL LVL3 (GOWN DISPOSABLE) ×9 IMPLANT
GRASPER SUT TROCAR 14GX15 (MISCELLANEOUS) ×3 IMPLANT
HANDLE STAPLE EGIA 4 XL (STAPLE) ×3 IMPLANT
HOVERMATT SINGLE USE (MISCELLANEOUS) ×3 IMPLANT
KIT BASIN OR (CUSTOM PROCEDURE TRAY) ×3 IMPLANT
KIT GASTRIC LAVAGE 34FR ADT (SET/KITS/TRAYS/PACK) IMPLANT
MARKER SKIN DUAL TIP RULER LAB (MISCELLANEOUS) ×3 IMPLANT
NEEDLE SPNL 22GX3.5 QUINCKE BK (NEEDLE) ×3 IMPLANT
PACK CARDIOVASCULAR III (CUSTOM PROCEDURE TRAY) ×3 IMPLANT
RELOAD EGIA 45 MED/THCK PURPLE (STAPLE) ×3 IMPLANT
RELOAD EGIA 45 TAN VASC (STAPLE) IMPLANT
RELOAD EGIA 60 MED/THCK PURPLE (STAPLE) ×12 IMPLANT
RELOAD EGIA 60 TAN VASC (STAPLE) ×9 IMPLANT
RELOAD ENDO STITCH 2.0 (ENDOMECHANICALS) ×24
SCISSORS METZENBAUM CVD 44CM (INSTRUMENTS) ×3 IMPLANT
SHEARS HARMONIC ACE PLUS 45CM (MISCELLANEOUS) ×3 IMPLANT
SLEEVE XCEL OPT CAN 5 100 (ENDOMECHANICALS) ×9 IMPLANT
SOLUTION ANTI FOG 6CC (MISCELLANEOUS) ×3 IMPLANT
STAPLER VISISTAT 35W (STAPLE) IMPLANT
STRIP CLOSURE SKIN 1/2X4 (GAUZE/BANDAGES/DRESSINGS) IMPLANT
SUT ETHIBOND 0 36 GRN (SUTURE) ×9 IMPLANT
SUT ETHILON 2 0 PS N (SUTURE) IMPLANT
SUT MNCRL AB 4-0 PS2 18 (SUTURE) ×3 IMPLANT
SUT RELOAD ENDO STITCH 2 48X1 (ENDOMECHANICALS) ×6
SUT RELOAD ENDO STITCH 2.0 (ENDOMECHANICALS) ×6
SUT SILK 0 SH 30 (SUTURE) ×3 IMPLANT
SUT VICRYL 0 TIES 12 18 (SUTURE) ×3 IMPLANT
SUTURE RELOAD END STTCH 2 48X1 (ENDOMECHANICALS) ×6 IMPLANT
SUTURE RELOAD ENDO STITCH 2.0 (ENDOMECHANICALS) ×6 IMPLANT
SYR 20CC LL (SYRINGE) ×3 IMPLANT
SYR 50ML LL SCALE MARK (SYRINGE) ×3 IMPLANT
TOWEL OR 17X26 10 PK STRL BLUE (TOWEL DISPOSABLE) ×3 IMPLANT
TOWEL OR NON WOVEN STRL DISP B (DISPOSABLE) ×3 IMPLANT
TRAY FOLEY W/METER SILVER 14FR (SET/KITS/TRAYS/PACK) ×3 IMPLANT
TRAY FOLEY W/METER SILVER 16FR (SET/KITS/TRAYS/PACK) IMPLANT
TROCAR BLADELESS OPT 5 100 (ENDOMECHANICALS) ×3 IMPLANT
TROCAR XCEL 12X100 BLDLESS (ENDOMECHANICALS) ×3 IMPLANT
TUBING CONNECTING 10 (TUBING) ×2 IMPLANT
TUBING CONNECTING 10' (TUBING) ×1
TUBING ENDO SMARTCAP PENTAX (MISCELLANEOUS) ×3 IMPLANT
TUBING INSUF HEATED (TUBING) ×3 IMPLANT

## 2017-03-31 NOTE — Op Note (Signed)
Preop Diagnosis: Obesity Class III  Postop Diagnosis: same  Procedure performed: laparoscopic Roux en Y gastric bypass  Assitant: Phylliss Blakes  Indications:  The patient is a 42 y.o. year-old morbidly obese female who has been followed in the Bariatric Clinic as an outpatient. This patient was diagnosed with morbid obesity with a BMI of Body mass index is 52.15 kg/m. and significant co-morbidities including hypertension and GERD.  The patient was counseled extensively in the Bariatric Outpatient Clinic and after a thorough explanation of the risks and benefits of surgery (including death from complications, bowel leak, infection such as peritonitis and/or sepsis, internal hernia, bleeding, need for blood transfusion, bowel obstruction, organ failure, pulmonary embolus, deep venous thrombosis, wound infection, incisional hernia, skin breakdown, and others entailed on the consent form) and after a compliant diet and exercise program, the patient was scheduled for an elective laparoscopic gastric bypass.  Description of Operation:  Following informed consent, the patient was taken to the operating room and placed on the operating table in the supine position.  She had previously received prophylactic antibiotics and subcutaneous heparin for DVT prophylaxis in the pre-op holding area.  After induction of general endotracheal anesthesia by the anesthesiologist, the patient underwent placement of sequential compression devices, Foley catheter and an oro-gastric tube.  A timeout was confirmed by the surgery and anesthesia teams.  The patient was adequately padded at all pressure points and placed on a footboard to prevent slippage from the OR table during extremes of position during surgery.  She underwent a routine sterile prep and drape of her entire abdomen.    Next, A transverse incision was made under the left subcostal area and a 5mm optical viewing trocar was introduced into the peritoneal cavity.  Pneumoperitoneum was applied with a high flow and low pressure. A laparoscope was inserted to confirm placement. A extraperitoneal block was then placed at the lateral abdominal wall using exparel diluted with marcaine . 5 additional trocars were placed: 1 5mm trocar to the left of the midline. 1 additional 5mm trocar in the left lateral area, 1 12mm trocar in the right mid abdomen, and 1 5mm trocar in the right subcostal area.  The greater omentum was flipped over the transverse colon and under the left lobe of the liver. The ligament of trietz was identified. 40cm of jejunum was measured starting from the ligament of Trietz. The mesentery was checked to ensure mobility. Next, a 60mm 2-63mm tristapler was used to divide the jejunum at this location. The harmonic scalpel was used to divide the mesentery down to the origin. A 1/2" penrose was sutured to the distal side. 100cm of jejunum was measured starting at the division. 2-0 silk was used to appose the biliary limb to the 100cm mark of jejunum in 2 places. Enterotomies were made in the biliary and common channels and a 60mm 2-3 tristapler was used to create the J-J anastomosis. A 2-0 silk was used to appose the enterotomy edges and a 2-3 tristapler was used to close the enterotomy. An anti-obstruction 2-0 silk suture was placed. Next, the mesenteric defect was closed with a 2-0 silk in running fashion.The J-J appeared patent and in neutral position.  Next, the omentum was divided using the Harmonic scalpel. The patient was placed in steep Reverse Trendelenberg position. A Nathanson retracted was placed through a subxiphoid incision and used to retract the liver. The UGI showed a small hiatal hernia. Therefore, the pars flaccida was incised with harmonic scalpel. The stomach was  reduced but on dissection of the posterior crus there was a visible hernia with small sac. The sac was dissected free and 2 0 ethibond sutures placed in interrupted fashion. A  calibration tube was passed to ensure appropriate size of the hiatus.   The fat pad over the fundus was incised to free the fundus. Next, a position along the lesser curve 6cm from GE junction was identified. The pars flaccida was entered and the fat over the lesser curve divided to enter the lesser sac. Multiple 60mm 3-484mm tristaple firings were peformed to create a 6cm pouch. The Roux limb was identified using the placed penrose and brought up to the stomach in antecolic fashion. The limb was inspected to ensure a neutral position. A 2-0 vicryl suture was then used to create a posterior layer connecting the stomach to the Roux limb jejunum in running fashion. Next cautery was used to create an enterotomy along the medial aspect of this suture line and Harmonic scalpel used to create gastotomy. A 45mm 3-534mm tristapler was then used to create a 25-6830mm anastomosis. 2 2-0 vicryl sutures were used in running fashion to close the gastrotomy. Finally, a 2-0 vicryl suture was used to close an anterior layer of stomach and jejunum over the anastomosis in running fashion. The penrose was removed from the Roux limb. A 2-0 vicryl was used to appose the transverse mesocolon to the mesentery of the Roux limb.  The assistant then went and performed an upper endoscopy and leak test. No bubbles were seen and the pouch and limb distended appropriately. The limb and pouch were deflated, the endoscope was removed. Hemostasis was ensured. Pneumoperitoneum was evacuated, all ports were removed and all incisions closed with 4-0 monocryl suture in subcuticular fashion. Steristrips and bandaids were put in place for dressing. The patient awoke from anesthesia and was brought to pacu in stable condition. All counts were correct.  Specimens:  None  Local Anesthesia: 50 ml Exparel: 0.5% Marcaine Mix  Post-Op Plan:       Pain Management: PO, prn      Antibiotics: Prophylactic      Anticoagulation: Prophylactic, Starting now       Post Op Studies/Consults: Not applicable      Intended Discharge: within 48h      Intended Outpatient Follow-Up: Two Week      Intended Outpatient Studies: Not Applicable      Other: Not Applicable   De BlanchLuke Aaron Kinsinger

## 2017-03-31 NOTE — Progress Notes (Addendum)
Discussed post op day goals with patient including ambulation, IS, diet progression, pain, and nausea control.  First cup of ice/water provided to patient.  Questions answered.

## 2017-03-31 NOTE — Discharge Instructions (Signed)
° ° ° °GASTRIC BYPASS/SLEEVE ° Home Care Instructions ° ° These instructions are to help you care for yourself when you go home. ° °Call: If you have any problems. °• Call [phone] and ask for the surgeon on call °• If you need immediate help, come to the ER at [facility].  °• Tell the ER staff that you are a new post-op gastric bypass or gastric sleeve patient °  °Signs and symptoms to report: • Severe vomiting or nausea °o If you cannot keep down clear liquids for longer than 1 day, call your surgeon  °• Abdominal pain that does not get better after taking your pain medication °• Fever over 100.4° F with chills °• Heart beating over 100 beats a minute °• Shortness of breath at rest °• Chest pain °•  Redness, swelling, drainage, or foul odor at incision (surgical) sites °•  If your incisions open or pull apart °• Swelling or pain in calf (lower leg) °• Diarrhea (Loose bowel movements that happen often), frequent watery, uncontrolled bowel movements °• Constipation, (no bowel movements for 3 days) if this happens: Pick one °o Milk of Magnesia, 2 tablespoons by mouth, 3 times a day for 2 days if needed °o Stop taking Milk of Magnesia once you have a bowel movement °o Call your doctor if constipation continues °Or °o Miralax  (instead of Milk of Magnesia) following the label instructions °o Stop taking Miralax once you have a bowel movement °o Call your doctor if constipation continues °• Anything you think is not normal °  °Normal side effects after surgery: • Unable to sleep at night or unable to focus °• Irritability or moody °• Being tearful (crying) or depressed °These are common complaints, possibly related to your anesthesia medications that put you to sleep, stress of surgery, and change in lifestyle.  This usually goes away a few weeks after surgery.  If these feelings continue, call your primary care doctor. °  °Wound Care: You may have surgical glue, steri-strips, or staples over your incisions after  surgery °• Surgical glue:  Looks like a clear film over your incisions and will wear off a little at a time °• Steri-strips: Strips of tape over your incisions. You may notice a yellowish color on the skin under the steri-strips. This is used to make the   steri-strips stick better. Do not pull the steri-strips off - let them fall off °• Staples: Staples may be removed before you leave the hospital °o If you go home with staples, call [facility], [phone] at for an appointment with your surgeon’s nurse to have staples removed 10 days after surgery. °• Showering: You may shower two (2) days after your surgery unless your surgeon tells you differently °o Wash gently around incisions with warm soapy water, rinse well, and gently pat dry  °o No tub baths until staples are removed, steri-strips fall off or glue is gone.  °  °Medications: • Medications should be liquid or crushed if larger than the size of a dime °• Extended release pills (medication that release a little bit at a time through the day) should NOT be crushed or cut. (examples include XL, ER, DR, SR) °• Depending on the size and number of medications you take, you may need to space (take a few throughout the day)/change the time you take your medications so that you do not over-fill your pouch (smaller stomach) °• Make sure you follow-up with your primary care doctor to   make medication changes needed during rapid weight loss and life-style changes °• If you have diabetes, follow up with the doctor that orders your diabetes medication(s) within one week after surgery and check your blood sugar regularly. °• Do not drive while taking prescription pain medication  °• It is ok to take Tylenol by the bottle instructions with your pain medicine or instead of your pain medicine as needed.  DO NOT TAKE NSAIDS (EXAMPLES OF NSAIDS:  IBUPROFREN/ NAPROXEN)  °Diet:                    First 2 Weeks ° You will see the dietician t about two (2) weeks  after your surgery. The dietician will increase the types of foods you can eat if you are handling liquids well: °• If you have severe vomiting or nausea and cannot keep down clear liquids lasting longer than 1 day, call your surgeon @ ([phone]) °Protein Shake °• Drink at least 2 ounces of shake 5-6 times per day °• Each serving of protein shakes (usually 8 - 12 ounces) should have: °o 15 grams of protein  °o And no more than 5 grams of carbohydrate  °• Goal for protein each day: °o Men = 80 grams per day °o Women = 60 grams per day °• Protein powder may be added to fluids such as non-fat milk or Lactaid milk or unsweetened Soy/Almond milk (limit to 35 grams added protein powder per serving) ° °Hydration °• Slowly increase the amount of water and other clear liquids as tolerated (See Acceptable Fluids) °• Slowly increase the amount of protein shake as tolerated  °•  Sip fluids slowly and throughout the day.  Do not use straws. °• May use sugar substitutes in small amounts (no more than 6 - 8 packets per day; i.e. Splenda) ° °Fluid Goal °• The first goal is to drink at least 8 ounces of protein shake/drink per day (or as directed by the nutritionist); some examples of protein shakes are Syntrax Nectar, Adkins Advantage, EAS Edge HP, and Unjury. See handout from pre-op Bariatric Education Class: °o Slowly increase the amount of protein shake you drink as tolerated °o You may find it easier to slowly sip shakes throughout the day °o It is important to get your proteins in first °• Your fluid goal is to drink 64 - 100 ounces of fluid daily °o It may take a few weeks to build up to this °• 32 oz (or more) should be clear liquids  °And  °• 32 oz (or more) should be full liquids (see below for examples) °• Liquids should not contain sugar, caffeine, or carbonation ° °Clear Liquids: °• Water or Sugar-free flavored water (i.e. Fruit H2O, Propel) °• Decaffeinated coffee or tea (sugar-free) °• Crystal Lite, Wyler’s Lite,  Minute Maid Lite °• Sugar-free Jell-O °• Bouillon or broth °• Sugar-free Popsicle:   *Less than 20 calories each; Limit 1 per day ° °Full Liquids: °Protein Shakes/Drinks + 2 choices per day of other full liquids °• Full liquids must be: °o No More Than 15 grams of Carbs per serving  °o No More Than 3 grams of Fat per serving °• Strained low-fat cream soup (except Cream of Potato or Tomato) °• Non-Fat milk °• Fat-free Lactaid Milk °• Unsweetened Soy Or Unsweetened Almond Milk °• Low Sugar yogurt (Dannon Lite & Fit, Greek yogurt; Oikos Triple Zero; Chobani Simply 100; Yoplait 100 calorie Greek - No Fruit on the Bottom) ° °  °Vitamins   and Minerals • Start 1 day after surgery unless otherwise directed by your surgeon °• 2 Chewable Bariatric Specific Multivitamin / Multimineral Supplement with iron (Example: Bariatric Advantage Multi EA) °• Chewable Calcium with Vitamin D-3 °(Example: 3 Chewable Calcium Plus 600 with Vitamin D-3) °o Take 500 mg three (3) times a day for a total of 1500 mg each day °o Do not take all 3 doses of calcium at one time as it may cause constipation, and you can only absorb 500 mg  at a time  °o Do not mix multivitamins containing iron with calcium supplements; take 2 hours apart °• Menstruating women and those with a history of anemia (a blood disease that causes weakness) may need extra iron °o Talk with your doctor to see if you need more iron °• Do not stop taking or change any vitamins or minerals until you talk to your dietitian or surgeon °• Your Dietitian and/or surgeon must approve all vitamin and mineral supplements °  °Activity and Exercise: Limit your physical activity as instructed by your doctor.  It is important to continue walking at home.  During this time, use these guidelines: °• Do not lift anything greater than ten (10) pounds for at least two (2) weeks °• Do not go back to work or drive until your surgeon says you can °• You may have sex when you feel comfortable  °o It is  VERY important for female patients to use a reliable birth control method; fertility often increases after surgery  °o All hormonal birth control will be ineffective for 30 days after surgery due to medications given during surgery a barrier method must be used. °o Do not get pregnant for at least 18 months °• Start exercising as soon as your doctor tells you that you can °o Make sure your doctor approves any physical activity °• Start with a simple walking program °• Walk 5-15 minutes each day, 7 days per week.  °• Slowly increase until you are walking 30-45 minutes per day °Consider joining our BELT program. [phone] or email [email or web address] °  °Special Instructions Things to remember: °• Use your CPAP when sleeping if this applies to you ° °• [facility] has two free Bariatric Surgery Support Groups that meet monthly °o The 3rd Thursday of each month, 6 pm, [facility]  °o The 2nd Friday of each month, 11:45 am in the private dining room in the basement of [facility] °• It is very important to keep all follow up appointments with your surgeon, dietitian, primary care physician, and behavioral health practitioner °• Routine follow up schedule with your surgeon include appointments at 2-3 weeks, 6-8 weeks, 6 months, and 1 year at a minimum.  Your surgeon may request to see you more often.   °o After the first year, please follow up with your bariatric surgeon and dietitian at least once a year in order to maintain best weight loss results °[facility]: [phone] °[facility]: [phone] °Bariatric Nurse Coordinator: [phone] °  °   Reviewed and Endorsed  °by [facility] Patient Education Committee, June, 2016 °Edits Approved: Aug, 2018 ° ° ° °

## 2017-03-31 NOTE — H&P (Signed)
Monica Hernandez is an 42 y.o. female.   Chief Complaint: obesity HPI: 42 yo female with prediabetes, hypertension and morbid obesity has done al the preoperative requirements and is ready to undergo gastric bypass for weight loss  Past Medical History:  Diagnosis Date  . Allergy   . Anemia   . Asthma   . GERD (gastroesophageal reflux disease)   . History of kidney stones   . Hypertension   . Kidney stone   . Mitral valve prolapse    was told as a kid that she had this but during ECHO for eval, ECHO did not indicate this , see 08-25-16 epic result   . PONV (postoperative nausea and vomiting)    epidural with c section caused N/V   . Pre-diabetes   . Ulcer     Past Surgical History:  Procedure Laterality Date  . ABDOMINAL HYSTERECTOMY    . ADENOIDECTOMY, TONSILLECTOMY AND MYRINGOTOMY WITH TUBE PLACEMENT    . CESAREAN SECTION    . FRACTURE SURGERY    . LEG SURGERY Left   . TUBAL LIGATION      Family History  Problem Relation Age of Onset  . Hypertension Mother   . Stroke Mother   . Thyroid cancer Mother   . Stroke Father   . Lung cancer Father   . Brain cancer Father   . Bone cancer Father   . Hypertension Sister   . Heart disease Maternal Grandmother   . Diabetes Maternal Grandmother   . Asthma Other   . Cancer Other   . Stomach cancer Neg Hx   . Colon cancer Neg Hx    Social History:  reports that she quit smoking about 9 years ago. Her smoking use included cigarettes. she has never used smokeless tobacco. She reports that she does not drink alcohol or use drugs.  Allergies:  Allergies  Allergen Reactions  . Contrave [Naltrexone-Bupropion Hcl Er] Nausea And Vomiting    Pt states it causes bloody stool, bloody vomit, and heart palpitations  . Erythromycin Nausea And Vomiting and Rash    Wheezing   . Shellfish Allergy Anaphylaxis  . Zithromax [Azithromycin] Nausea And Vomiting, Rash and Other (See Comments)  . Belviq [Lorcaserin Hcl] Other (See Comments)     Causes changes in vision and respiratory issues  . Qsymia [Phentermine-Topiramate] Other (See Comments)    Causes change in vision and respiratory issues   . Saxenda [Liraglutide -Weight Management] Other (See Comments)    Causes fainting  . Allegra-D [Fexofenadine-Pseudoephed Er] Nausea And Vomiting  . Naltrexone Other (See Comments)    Bloody stools.   . Fish Allergy Rash  . Sulfa Antibiotics Rash    Medications Prior to Admission  Medication Sig Dispense Refill  . Cholecalciferol 2000 units CAPS Take 2,000 Units by mouth daily.    Marland Kitchen dicyclomine (BENTYL) 10 MG capsule Take 1 capsule (10 mg total) by mouth 3 (three) times daily before meals. (Patient taking differently: Take 10 mg by mouth daily. ) 90 capsule 11  . furosemide (LASIX) 20 MG tablet Take 1 tablet (20 mg total) by mouth daily. 90 tablet 1  . lansoprazole (PREVACID) 30 MG capsule Take 1 capsule (30 mg total) by mouth daily. 30 capsule 11  . Multiple Vitamin (MULTIVITAMIN WITH MINERALS) TABS tablet Take 1 tablet by mouth daily.    Marland Kitchen albuterol (PROVENTIL HFA;VENTOLIN HFA) 108 (90 Base) MCG/ACT inhaler Inhale 2 puffs into the lungs every 4 (four) hours as needed for wheezing or  shortness of breath. 1 Inhaler 0  . amoxicillin-clavulanate (AUGMENTIN) 875-125 MG tablet Take 1 tablet by mouth 2 (two) times daily. (Patient not taking: Reported on 03/17/2017) 14 tablet 0  . buPROPion (WELLBUTRIN) 100 MG tablet Take 1 tablet (100 mg total) by mouth 2 (two) times daily. (Patient not taking: Reported on 11/19/2016) 60 tablet 6  . diclofenac (VOLTAREN) 75 MG EC tablet Take 1 tablet (75 mg total) by mouth 2 (two) times daily. (Patient not taking: Reported on 01/06/2017) 30 tablet 0  . methocarbamol (ROBAXIN) 500 MG tablet Take one 3 times daily at breakfast lunch and supper and 2 at bedtime for muscle relaxant. (Patient not taking: Reported on 11/19/2016) 40 tablet 1  . traMADol (ULTRAM) 50 MG tablet Take 1 tablet (50 mg total) by mouth every 8  (eight) hours as needed. (Patient not taking: Reported on 11/19/2016) 15 tablet 0    No results found for this or any previous visit (from the past 48 hour(s)). No results found.  Review of Systems  Constitutional: Negative for chills and fever.  HENT: Negative for hearing loss.   Eyes: Negative for blurred vision and double vision.  Respiratory: Negative for cough and hemoptysis.   Cardiovascular: Negative for chest pain and palpitations.  Gastrointestinal: Negative for abdominal pain, nausea and vomiting.  Genitourinary: Negative for dysuria and urgency.  Musculoskeletal: Negative for myalgias and neck pain.  Skin: Negative for itching and rash.  Neurological: Negative for dizziness, tingling and headaches.  Endo/Heme/Allergies: Does not bruise/bleed easily.  Psychiatric/Behavioral: Negative for depression and suicidal ideas.    Blood pressure (!) 139/95, pulse (!) 105, temperature 98.3 F (36.8 C), temperature source Oral, resp. rate 18, height 5\' 4"  (1.626 m), weight (!) 137.8 kg (303 lb 12.8 oz), SpO2 95 %. Physical Exam  Vitals reviewed. Constitutional: She is oriented to person, place, and time. She appears well-developed and well-nourished.  HENT:  Head: Normocephalic and atraumatic.  Eyes: Conjunctivae and EOM are normal. Pupils are equal, round, and reactive to light.  Neck: Normal range of motion. Neck supple.  Cardiovascular: Normal rate and regular rhythm.  Respiratory: Effort normal and breath sounds normal.  GI: Soft. Bowel sounds are normal. She exhibits no distension. There is no tenderness.  Musculoskeletal: Normal range of motion.  Neurological: She is alert and oriented to person, place, and time.  Skin: Skin is warm and dry.  Psychiatric: She has a normal mood and affect. Her behavior is normal.     Assessment/Plan 42 yo female with hypertension and morbid obesity -lap RNY gastric bypass -ERAS protocol -bariatric protocol  Rodman PickleLuke Aaron Zaydenn Balaguer,  MD 03/31/2017, 7:10 AM

## 2017-03-31 NOTE — Transfer of Care (Signed)
Immediate Anesthesia Transfer of Care Note  Patient: Monica Hernandez  Procedure(s) Performed: LAPAROSCOPIC ROUX-EN-Y GASTRIC BYPASS WITH UPPER ENDOSCOPY (N/A )  Patient Location: PACU  Anesthesia Type:General  Level of Consciousness: sedated  Airway & Oxygen Therapy: Patient Spontanous Breathing and Patient connected to face mask oxygen  Post-op Assessment: Report given to RN and Post -op Vital signs reviewed and stable  Post vital signs: Reviewed and stable  Last Vitals:  Vitals:   03/31/17 0543  BP: (!) 139/95  Pulse: (!) 105  Resp: 18  Temp: 36.8 C  SpO2: 95%    Last Pain:  Vitals:   03/31/17 0543  TempSrc: Oral         Complications: No apparent anesthesia complications

## 2017-03-31 NOTE — Anesthesia Procedure Notes (Signed)
Procedure Name: Intubation Date/Time: 03/31/2017 7:33 AM Performed by: Lind Covert, CRNA Pre-anesthesia Checklist: Patient identified, Emergency Drugs available, Suction available, Patient being monitored and Timeout performed Patient Re-evaluated:Patient Re-evaluated prior to induction Oxygen Delivery Method: Circle system utilized Preoxygenation: Pre-oxygenation with 100% oxygen Induction Type: IV induction Ventilation: Mask ventilation without difficulty Laryngoscope Size: Mac and 4 Grade View: Grade I Tube type: Oral Tube size: 7.0 mm Number of attempts: 1 Airway Equipment and Method: Stylet Placement Confirmation: ETT inserted through vocal cords under direct vision,  positive ETCO2 and breath sounds checked- equal and bilateral Secured at: 21 cm Tube secured with: Tape Dental Injury: Teeth and Oropharynx as per pre-operative assessment

## 2017-03-31 NOTE — Op Note (Signed)
Preoperative diagnosis: Roux-en-Y gastric bypass  Postoperative diagnosis: Same   Procedure: Upper endoscopy   Surgeon: Berna Buehelsea A Euline Kimbler, M.D.  Anesthesia: Gen.   Indications for procedure: This patient was undergoing a Roux-en-Y gastric bypass.   Description of procedure: The endoscopy was placed in the mouth and into the oropharynx and under endoscopic vision it was advanced to the esophagogastric junction. The pouch was insufflated and no bleeding or bubbles were seen.The anastomosis was patent and hemostatic. The scope was withdrawn without difficulty.   Berna Buehelsea A Kerstie Agent, M.D. General, Bariatric, & Minimally Invasive Surgery [facility]

## 2017-03-31 NOTE — Anesthesia Postprocedure Evaluation (Signed)
Anesthesia Post Note  Patient: Monica Hernandez  Procedure(s) Performed: LAPAROSCOPIC ROUX-EN-Y GASTRIC BYPASS WITH UPPER ENDOSCOPY (N/A )     Patient location during evaluation: PACU Anesthesia Type: General Level of consciousness: awake and alert Pain management: pain level controlled Vital Signs Assessment: post-procedure vital signs reviewed and stable Respiratory status: spontaneous breathing, nonlabored ventilation and respiratory function stable Cardiovascular status: blood pressure returned to baseline and stable Postop Assessment: no apparent nausea or vomiting Anesthetic complications: no    Last Vitals:  Vitals:   03/31/17 1130 03/31/17 1155  BP: (!) 136/94 (!) 149/82  Pulse: (!) 102 83  Resp: 18 16  Temp: 36.4 C 36.4 C  SpO2: 91% 97%                  Beryle Lathehomas E Brock

## 2017-04-01 ENCOUNTER — Encounter (HOSPITAL_COMMUNITY): Payer: Self-pay | Admitting: General Surgery

## 2017-04-01 LAB — COMPREHENSIVE METABOLIC PANEL
ALT: 46 U/L (ref 14–54)
ANION GAP: 6 (ref 5–15)
AST: 48 U/L — ABNORMAL HIGH (ref 15–41)
Albumin: 3.4 g/dL — ABNORMAL LOW (ref 3.5–5.0)
Alkaline Phosphatase: 47 U/L (ref 38–126)
BILIRUBIN TOTAL: 0.3 mg/dL (ref 0.3–1.2)
BUN: 13 mg/dL (ref 6–20)
CO2: 25 mmol/L (ref 22–32)
CREATININE: 0.76 mg/dL (ref 0.44–1.00)
Calcium: 8.6 mg/dL — ABNORMAL LOW (ref 8.9–10.3)
Chloride: 107 mmol/L (ref 101–111)
GFR calc non Af Amer: >60 mL/min (ref >60)
GLUCOSE: 107 mg/dL — AB (ref 65–99)
Potassium: 4 mmol/L (ref 3.5–5.1)
Sodium: 138 mmol/L (ref 135–145)
TOTAL PROTEIN: 6.5 g/dL (ref 6.5–8.1)

## 2017-04-01 LAB — CBC WITH DIFFERENTIAL/PLATELET
Basophils Absolute: 0 10*3/uL (ref 0.0–0.1)
Basophils Relative: 0 %
Eosinophils Absolute: 0 10*3/uL (ref 0.0–0.7)
Eosinophils Relative: 0 %
HEMATOCRIT: 34.1 % — AB (ref 36.0–46.0)
HEMOGLOBIN: 10.9 g/dL — AB (ref 12.0–15.0)
LYMPHS ABS: 2.8 10*3/uL (ref 0.7–4.0)
LYMPHS PCT: 24 %
MCH: 28.2 pg (ref 26.0–34.0)
MCHC: 32 g/dL (ref 30.0–36.0)
MCV: 88.1 fL (ref 78.0–100.0)
MONOS PCT: 7 %
Monocytes Absolute: 0.8 10*3/uL (ref 0.1–1.0)
NEUTROS ABS: 8.3 10*3/uL — AB (ref 1.7–7.7)
NEUTROS PCT: 69 %
Platelets: 264 10*3/uL (ref 150–400)
RBC: 3.87 MIL/uL (ref 3.87–5.11)
RDW: 14.9 % (ref 11.5–15.5)
WBC: 11.9 10*3/uL — AB (ref 4.0–10.5)

## 2017-04-01 NOTE — Discharge Summary (Signed)
Physician Discharge Summary  Monica Hernandez [id number] DOB: Jul 09, 1975 DOA: 03/31/2017  PCP: Ofilia Neaslark, Michael L, PA-C  Admit date: 03/31/2017 Discharge date: 04/01/2017  Recommendations for Outpatient Follow-up:  1.  (include homehealth, outpatient follow-up instructions, specific recommendations for PCP to follow-up on, etc.)  Follow-up Information    Monica Hernandez, De BlanchLuke Aaron, MD. Go on 04/17/2017.   Specialty:  General Surgery Why:  at 11:45am. Contact information: [address] [place] [address] [phone]        Lochlin Eppinger, De BlanchLuke Aaron, MD Follow up.   Specialty:  General Surgery Contact information: [address] [place] [address] [phone]          Discharge Diagnoses:  Active Problems:   Morbid obesity (HCC)   Surgical Procedure: Laparoscopic Sleeve Gastrectomy, upper endoscopy  Discharge Condition: Good Disposition: Home  Diet recommendation: Postoperative sleeve gastrectomy diet (liquids only)  Filed Weights   03/31/17 0543 04/01/17 0624  Weight: (!) 137.8 kg (303 lb 12.8 oz) (!) 140.8 kg (310 lb 6.5 oz)     Hospital Course:  The patient was admitted after undergoing Roux-en-Y gastric bypass. POD 0 she ambulated well. POD 1 she was started on the water diet protocol and tolerated 400 ml in the first shift. Once meeting the water amount she was advanced to bariatric protein shakes which they tolerated and were discharged home POD 1.  Treatments: surgery: Roux-en-Y gastric bypass  Discharge Instructions  Discharge Instructions    Ambulate hourly while awake   Complete by:  As directed    Call MD for:  difficulty breathing, headache or visual disturbances   Complete by:  As directed    Call MD for:  persistant dizziness or light-headedness   Complete by:  As directed    Call MD for:  persistant nausea and vomiting   Complete by:  As directed    Call MD for:  redness, tenderness, or signs of infection (pain,  swelling, redness, odor or green/yellow discharge around incision site)   Complete by:  As directed    Call MD for:  severe uncontrolled pain   Complete by:  As directed    Call MD for:  temperature >101 F   Complete by:  As directed    Diet bariatric full liquid   Complete by:  As directed    Discharge wound care:   Complete by:  As directed    Remove Bandaids tomorrow, ok to shower tomorrow. Steristrips may fall off in 1-3 weeks.   Incentive spirometry   Complete by:  As directed    Perform hourly while awake     Allergies as of 04/01/2017      Reactions   Contrave [naltrexone-bupropion Hcl Er] Nausea And Vomiting   Pt states it causes bloody stool, bloody vomit, and heart palpitations   Erythromycin Nausea And Vomiting, Rash   Wheezing    Shellfish Allergy Anaphylaxis   Zithromax [azithromycin] Nausea And Vomiting, Rash, Other (See Comments)   Belviq [lorcaserin Hcl] Other (See Comments)   Causes changes in vision and respiratory issues   Qsymia [phentermine-topiramate] Other (See Comments)   Causes change in vision and respiratory issues    Saxenda [liraglutide -weight Management] Other (See Comments)   Causes fainting   Allegra-d [fexofenadine-pseudoephed Er] Nausea And Vomiting   Naltrexone Other (See Comments)   Bloody stools.    Fish Allergy Rash   Sulfa Antibiotics Rash      Medication List    STOP taking  these medications   amoxicillin-clavulanate 875-125 MG tablet Commonly known as:  AUGMENTIN   diclofenac 75 MG EC tablet Commonly known as:  VOLTAREN   furosemide 20 MG tablet Commonly known as:  LASIX     TAKE these medications   albuterol 108 (90 Base) MCG/ACT inhaler Commonly known as:  PROVENTIL HFA;VENTOLIN HFA Inhale 2 puffs into the lungs every 4 (four) hours as needed for wheezing or shortness of breath.   buPROPion 100 MG tablet Commonly known as:  WELLBUTRIN Take 1 tablet (100 mg total) by mouth 2 (two) times daily.   Cholecalciferol 2000  units Caps Take 2,000 Units by mouth daily.   dicyclomine 10 MG capsule Commonly known as:  BENTYL Take 1 capsule (10 mg total) by mouth 3 (three) times daily before meals. What changed:  when to take this   lansoprazole 30 MG capsule Commonly known as:  PREVACID Take 1 capsule (30 mg total) by mouth daily.   methocarbamol 500 MG tablet Commonly known as:  ROBAXIN Take one 3 times daily at breakfast lunch and supper and 2 at bedtime for muscle relaxant.   multivitamin with minerals Tabs tablet Take 1 tablet by mouth daily.   traMADol 50 MG tablet Commonly known as:  ULTRAM Take 1 tablet (50 mg total) by mouth every 8 (eight) hours as needed.            Discharge Care Instructions  (From admission, onward)        Start     Ordered   04/01/17 0000  Discharge wound care:    Comments:  Remove Bandaids tomorrow, ok to shower tomorrow. Steristrips may fall off in 1-3 weeks.   04/01/17 1053     Follow-up Information    Monica Hernandez, De Blanch, MD. Go on 04/17/2017.   Specialty:  General Surgery Why:  at 11:45am. Contact information: [address] [place] [address] [phone]        Monica Hernandez, De Blanch, MD Follow up.   Specialty:  General Surgery Contact information: [address] [place] [address] [phone]            The results of significant diagnostics from this hospitalization (including imaging, microbiology, ancillary and laboratory) are listed below for reference.    Significant Diagnostic Studies: No results found.  Labs: Basic Metabolic Panel: Recent Labs  Lab 03/27/17 1503 04/01/17 0456  NA 139 138  K 4.3 4.0  CL 105 107  CO2 27 25  GLUCOSE 99 107*  BUN 19 13  CREATININE 0.81 0.76  CALCIUM 9.3 8.6*   Liver Function Tests: Recent Labs  Lab 03/27/17 1503 04/01/17 0456  AST 18 48*  ALT 13* 46  ALKPHOS 67 47  BILITOT 0.5 0.3  PROT 7.5 6.5  ALBUMIN 4.1 3.4*    CBC: Recent Labs  Lab  03/27/17 1503 03/31/17 1059 04/01/17 0456  WBC 8.8  --  11.9*  NEUTROABS 5.0  --  8.3*  HGB 12.1 12.3 10.9*  HCT 38.1 36.9 34.1*  MCV 89.2  --  88.1  PLT 288  --  264    CBG: No results for input(s): GLUCAP in the last 168 hours.  Active Problems:   Morbid obesity (HCC)   Time coordinating discharge: 

## 2017-04-01 NOTE — Progress Notes (Signed)
Patient alert and oriented, Post op day 1.  Provided support and encouragement.  Encouraged pulmonary toilet, ambulation and small sips of liquids. Completed 12 ounces of clear fluids began protein shakes. No complaints of pain.  All questions answered.  Will continue to monitor.

## 2017-04-01 NOTE — Progress Notes (Signed)
Discharge insructions discussed with patient and family, verbalized agreement and understanding

## 2017-04-01 NOTE — Progress Notes (Signed)
Patient alert and oriented, pain is controlled. Patient is tolerating fluids, advanced to protein shake today, patient is tolerating well.  Reviewed Gastric sleeve discharge instructions with patient and patient is able to articulate understanding.  Provided information on BELT program, Support Group and [facility] outpatient pharmacy. All questions answered, will continue to monitor.  

## 2017-04-02 ENCOUNTER — Other Ambulatory Visit: Payer: Self-pay | Admitting: *Deleted

## 2017-04-02 ENCOUNTER — Encounter: Payer: Self-pay | Admitting: *Deleted

## 2017-04-02 NOTE — Patient Outreach (Signed)
[facility] Care Management  04/02/2017  Jacquelynn Creeorma Jean Craw 1975/04/22 [id number]   Subjective: Telephone call to patient's home / mobile number, spoke with patient, and HIPAA verified.  Discussed [facility] Care Management [facility] Transition of care follow up, patient voiced understanding, and is in agreement to follow up.  Patient states she is doing well, remembers speaking to this RNCM in the past, tolerating liquid diet without difficulty, has follow up appointment with primary provider on 04/10/17, and follow up with surgeon on 04/17/17.   Patient states she is able to manage self care and has assistance as needed with activities of daily living / home management.  Patient voices understanding of medical diagnosis, surgery, and treatment plan.  States she is accessing the following [facility] benefits: outpatient pharmacy, hospital indemnity (hospital indemnity (has claim form, will file claim if appropriate, verbally given contact number for [facility] [phone] to request itemized bill), and has family medical leave act (FMLA) in place.   Patient states she does not have any education material, transition of care, care coordination, disease management, disease monitoring, transportation, community resource, or pharmacy needs at this time.  States she is very appreciative of the follow up and is in agreement to receive [facility] Care [facility] information.    Objective:Per KPN (Knowledge Performance Now, point of care tool) and chart review,patient hospitalized  03/31/17 -04/01/17 for morbid obesity, at [facility].   Status post LAPAROSCOPIC ROUX-EN-Y GASTRIC BYPASS WITH UPPER ENDOSCOPY on 03/31/17. Patient also has a history of mitral valve prolapse.    Assessment: Received [facility] Preoperative / Transition of care referral on 03/09/17.Preoperative call follow up not completed, patient unable to contact.  Transition of care follow up completed, no care management  needs, and will proceed with case closure.      Plan:RNCM will send patient successful outreach letter, [facility] pamphlet, and magnet. RNCM will send case closure due to follow up completed / no care management needs request to Iverson AlaminLaura Greeson at [facility] Care Management.    Sheronda Parran H. Gardiner Barefootooper RN, BSN, CCM [facility] Care Management [facility] Telephonic CM Phone: [phone] Fax: [phone]

## 2017-04-03 ENCOUNTER — Telehealth: Payer: Self-pay

## 2017-04-03 NOTE — Telephone Encounter (Signed)
Transition Care Management Follow-up Telephone Call   Date discharged? 04/01/17   How have you been since you were released from the hospital? Patient is doing fine   Do you understand why you were in the hospital? yes   Do you understand the discharge instructions? yes   Where were you discharged to? home   Items Reviewed:  Medications reviewed: yes  Allergies reviewed: yes  Dietary changes reviewed: yes  Referrals reviewed: yes   Functional Questionnaire:   Activities of Daily Living (ADLs):   She states they are independent in the following: ambulation, bathing and hygiene, feeding, continence, grooming, toileting and dressing States they require assistance with the following: none   Any transportation issues/concerns?: no   Any patient concerns? no   Confirmed importance and date/time of follow-up visits scheduled yes  Provider Appointment booked with Deliah BostonMichael Clark on 04/10/17 @ 10:20 am  Confirmed with patient if condition begins to worsen call PCP or go to the ER.  Patient was given the office number and encouraged to call back with question or concerns.  : yes

## 2017-04-06 ENCOUNTER — Telehealth (HOSPITAL_COMMUNITY): Payer: Self-pay

## 2017-04-06 NOTE — Telephone Encounter (Signed)
Patient called to discuss post bariatric surgery follow up questions.  See below:   1.  Tell me about your pain and pain management?patient only using Tylenol.  Pain is usually at night when trying to sleep 2.  Let's talk about fluid intake.  How much total fluid are you taking in?64+ ounces of fluid  3.  How much protein have you taken in the last 2 days?52-67 grams of protein  4.  Have you had nausea?  Tell me about when have experienced nausea and what you did to help?nauseated with cream of mushroom soup and jello.  All other fluids are ok.  She did take nausea medicine with this instance without success.  Has not needed since that cream of mushroom soup.  5.  Has the frequency or color changed with your urine?light in color no problems with urination  6.  Tell me what your incisions look like?look ok  7.  Have you been passing gas? BM?BM everyday since Thursday discharged from hospital  8.  If a problem or question were to arise who would you call?  Do you know contact numbers for [facility], [facility], and [facility]?yes is aware of all services and phone numbers for contact  9.  How has the walking going?walking regularly, was concerned if she could go out in public.  Encouraged to avoid areas of known sickness and use good handwashing as her concern is the flu   10.  How are your vitamins and calcium going?  How are you taking them?Taking both without issue  Patient did have nose bleeds Wed and Thursday after discharge, but none since. Nose bleeds are not normal for patient.  She knows that if she has any further bleeding to call [facility]

## 2017-04-10 ENCOUNTER — Ambulatory Visit (INDEPENDENT_AMBULATORY_CARE_PROVIDER_SITE_OTHER): Payer: 59 | Admitting: Physician Assistant

## 2017-04-10 ENCOUNTER — Other Ambulatory Visit: Payer: Self-pay

## 2017-04-10 ENCOUNTER — Encounter: Payer: Self-pay | Admitting: Physician Assistant

## 2017-04-10 NOTE — Progress Notes (Signed)
04/11/2017 8:14 AM   DOB: October 14, 1975 / MRN: [id number]  SUBJECTIVE:  Monica Hernandez is a 42 y.o. female presenting for follow up of bariatric surgery. She had gastric bypass (roux-en-Y) with addition of hiatal hernia fixation. She feels well today and has lost about ten lbs over the last week. She feels well today and denies complaint. She is drinking roughly 1 gallon of water daily.  She is following the meal plan. She is moving her bowel every other day. Denies any blood in the stool and these are well formed. She would like to lose down to 175-180.    She is allergic to contrave [naltrexone-bupropion hcl er]; erythromycin; shellfish allergy; zithromax [azithromycin]; belviq [lorcaserin hcl]; qsymia [phentermine-topiramate]; saxenda [liraglutide -weight management]; allegra-d [fexofenadine-pseudoephed er]; naltrexone; fish allergy; and sulfa antibiotics.   She  has a past medical history of Allergy, Anemia, Asthma, GERD (gastroesophageal reflux disease), History of kidney stones, Hypertension, Kidney stone, Mitral valve prolapse, PONV (postoperative nausea and vomiting), Pre-diabetes, and Ulcer.    She  reports that she quit smoking about 9 years ago. Her smoking use included cigarettes. she has never used smokeless tobacco. She reports that she does not drink alcohol or use drugs. She  reports that she currently engages in sexual activity and has had partners who are Female. She reports using the following method of birth control/protection: Surgical. The patient  has a past surgical history that includes Tubal ligation; Adenoidectomy, tonsillectomy and myringotomy with tube placement; Abdominal hysterectomy; Leg Surgery (Left); Cesarean section; Fracture surgery; and Gastric Roux-En-Y (N/A, 03/31/2017).  Her family history includes Asthma in her other; Bone cancer in her father; Brain cancer in her father; Cancer in her other; Diabetes in her maternal grandmother; Heart disease in her maternal  grandmother; Hypertension in her mother and sister; Lung cancer in her father; Stroke in her father and mother; Thyroid cancer in her mother.  Review of Systems  Constitutional: Negative for chills, diaphoresis and fever.  Respiratory: Negative for cough, hemoptysis, sputum production, shortness of breath and wheezing.   Cardiovascular: Negative for chest pain.  Gastrointestinal: Negative for abdominal pain, blood in stool, constipation, diarrhea, heartburn, melena, nausea and vomiting.  Genitourinary: Negative for flank pain.  Skin: Negative for rash.  Neurological: Negative for dizziness.    The problem list and medications were reviewed and updated by myself where necessary and exist elsewhere in the encounter.   OBJECTIVE:  BP 123/75 (BP Location: Right Wrist, Patient Position: Sitting, Cuff Size: Large)   Pulse 78   Temp 97.6 F (36.4 C) (Oral)   Resp 16   Ht 5\' 4"  (1.626 m)   Wt 294 lb 12.8 oz (133.7 kg)   SpO2 98%   BMI 50.60 kg/m   Wt Readings from Last 3 Encounters:  04/10/17 294 lb 12.8 oz (133.7 kg)  04/01/17 (!) 310 lb 6.5 oz (140.8 kg)  03/27/17 (!) 306 lb (138.8 kg)   Temp Readings from Last 3 Encounters:  04/10/17 97.6 F (36.4 C) (Oral)  04/01/17 98.8 F (37.1 C) (Oral)  03/27/17 98.4 F (36.9 C) (Oral)   BP Readings from Last 3 Encounters:  04/10/17 123/75  04/01/17 138/72  03/27/17 129/88   Pulse Readings from Last 3 Encounters:  04/10/17 78  04/01/17 70  03/27/17 65   Physical Exam  Constitutional: She is oriented to person, place, and time. She is active.  Non-toxic appearance.  Cardiovascular: Normal rate.  Pulmonary/Chest: Effort normal. No tachypnea.  Musculoskeletal: Normal range of  motion.  Neurological: She is alert and oriented to person, place, and time.  Skin: Skin is warm and dry. She is not diaphoretic. No pallor.    Results for orders placed or performed in visit on 04/10/17 (from the past 72 hour(s))  Renal Function Panel      Status: Abnormal   Collection Time: 04/10/17 12:29 PM  Result Value Ref Range   Glucose 94 65 - 99 mg/dL   BUN 13 6 - 24 mg/dL   Creatinine, Ser 1.61 0.57 - 1.00 mg/dL   GFR calc non Af Amer 82 >59 mL/min/1.73   GFR calc Af Amer 94 >59 mL/min/1.73   BUN/Creatinine Ratio 15 9 - 23   Sodium 139 134 - 144 mmol/L   Potassium 4.4 3.5 - 5.2 mmol/L   Chloride 100 96 - 106 mmol/L   CO2 19 (L) 20 - 29 mmol/L   Calcium 9.9 8.7 - 10.2 mg/dL   Phosphorus 3.9 2.5 - 4.5 mg/dL   Albumin 4.7 3.5 - 5.5 g/dL  CBC with Differential     Status: None   Collection Time: 04/10/17 12:29 PM  Result Value Ref Range   WBC 9.4 3.4 - 10.8 x10E3/uL   RBC 4.66 3.77 - 5.28 x10E6/uL   Hemoglobin 13.3 11.1 - 15.9 g/dL   Hematocrit 09.6 04.5 - 46.6 %   MCV 87 79 - 97 fL   MCH 28.5 26.6 - 33.0 pg   MCHC 32.7 31.5 - 35.7 g/dL   RDW 40.9 81.1 - 91.4 %   Platelets 343 150 - 379 x10E3/uL   Neutrophils 61 Not Estab. %   Lymphs 29 Not Estab. %   Monocytes 6 Not Estab. %   Eos 4 Not Estab. %   Basos 0 Not Estab. %   Neutrophils Absolute 5.7 1.4 - 7.0 x10E3/uL   Lymphocytes Absolute 2.7 0.7 - 3.1 x10E3/uL   Monocytes Absolute 0.6 0.1 - 0.9 x10E3/uL   EOS (ABSOLUTE) 0.4 0.0 - 0.4 x10E3/uL   Basophils Absolute 0.0 0.0 - 0.2 x10E3/uL   Immature Granulocytes 0 Not Estab. %   Immature Grans (Abs) 0.0 0.0 - 0.1 x10E3/uL  Hepatic Function Panel     Status: None   Collection Time: 04/10/17 12:29 PM  Result Value Ref Range   Total Protein 7.6 6.0 - 8.5 g/dL   Bilirubin Total 0.2 0.0 - 1.2 mg/dL   Bilirubin, Direct 7.82 0.00 - 0.40 mg/dL   Alkaline Phosphatase 76 39 - 117 IU/L   AST 18 0 - 40 IU/L   ALT 28 0 - 32 IU/L  Hemoglobin A1c     Status: Abnormal   Collection Time: 04/10/17 12:29 PM  Result Value Ref Range   Hgb A1c MFr Bld 5.7 (H) 4.8 - 5.6 %    Comment:          Prediabetes: 5.7 - 6.4          Diabetes: >6.4          Glycemic control for adults with diabetes: <7.0    Est. average glucose Bld gHb  Est-mCnc 117 mg/dL    No results found.  ASSESSMENT AND PLAN:  Monica Hernandez was seen today for transitions of care.  Diagnoses and all orders for this visit:  Morbid obesity (HCC): Status post Roux-en-Y.  She is doing well.  Her labs are excellent.  She wants to start back on her Lasix.  I have advised that this is too risky in the setting of a large calorie  deficit secondary to rapid weight loss she agrees we will hold off any medications for now. -     Renal Function Panel -     CBC with Differential -     Hepatic Function Panel    The patient is advised to call or return to clinic if she does not see an improvement in symptoms, or to seek the care of the closest emergency department if she worsens with the above plan.   Deliah BostonMichael Clark, MHS, PA-C Primary Care at [facility] 04/11/2017 8:14 AM

## 2017-04-10 NOTE — Patient Instructions (Signed)
     IF you received an x-ray today, you will receive an invoice from [facility]. Please contact [facility] at [phone] with questions or concerns regarding your invoice.   IF you received labwork today, you will receive an invoice from [facility]. Please contact [facility] at [phone] with questions or concerns regarding your invoice.   Our billing staff will not be able to assist you with questions regarding bills from these companies.  You will be contacted with the lab results as soon as they are available. The fastest way to get your results is to activate your My Chart account. Instructions are located on the last page of this paperwork. If you have not heard from us regarding the results in 2 weeks, please contact this office.     

## 2017-04-11 LAB — CBC WITH DIFFERENTIAL/PLATELET
BASOS ABS: 0 10*3/uL (ref 0.0–0.2)
BASOS: 0 %
EOS (ABSOLUTE): 0.4 10*3/uL (ref 0.0–0.4)
EOS: 4 %
HEMATOCRIT: 40.7 % (ref 34.0–46.6)
HEMOGLOBIN: 13.3 g/dL (ref 11.1–15.9)
IMMATURE GRANS (ABS): 0 10*3/uL (ref 0.0–0.1)
Immature Granulocytes: 0 %
LYMPHS: 29 %
Lymphocytes Absolute: 2.7 10*3/uL (ref 0.7–3.1)
MCH: 28.5 pg (ref 26.6–33.0)
MCHC: 32.7 g/dL (ref 31.5–35.7)
MCV: 87 fL (ref 79–97)
MONOCYTES: 6 %
Monocytes Absolute: 0.6 10*3/uL (ref 0.1–0.9)
Neutrophils Absolute: 5.7 10*3/uL (ref 1.4–7.0)
Neutrophils: 61 %
Platelets: 343 10*3/uL (ref 150–379)
RBC: 4.66 x10E6/uL (ref 3.77–5.28)
RDW: 14.6 % (ref 12.3–15.4)
WBC: 9.4 10*3/uL (ref 3.4–10.8)

## 2017-04-11 LAB — RENAL FUNCTION PANEL
ALBUMIN: 4.7 g/dL (ref 3.5–5.5)
BUN/Creatinine Ratio: 15 (ref 9–23)
BUN: 13 mg/dL (ref 6–24)
CHLORIDE: 100 mmol/L (ref 96–106)
CO2: 19 mmol/L — ABNORMAL LOW (ref 20–29)
CREATININE: 0.87 mg/dL (ref 0.57–1.00)
Calcium: 9.9 mg/dL (ref 8.7–10.2)
GFR, EST AFRICAN AMERICAN: 94 mL/min/{1.73_m2} (ref >59)
GFR, EST NON AFRICAN AMERICAN: 82 mL/min/{1.73_m2} (ref >59)
Glucose: 94 mg/dL (ref 65–99)
Phosphorus: 3.9 mg/dL (ref 2.5–4.5)
Potassium: 4.4 mmol/L (ref 3.5–5.2)
Sodium: 139 mmol/L (ref 134–144)

## 2017-04-11 LAB — HEPATIC FUNCTION PANEL
ALK PHOS: 76 IU/L (ref 39–117)
ALT: 28 IU/L (ref 0–32)
AST: 18 IU/L (ref 0–40)
Bilirubin Total: 0.2 mg/dL (ref 0.0–1.2)
Bilirubin, Direct: 0.09 mg/dL (ref 0.00–0.40)
TOTAL PROTEIN: 7.6 g/dL (ref 6.0–8.5)

## 2017-04-11 LAB — HEMOGLOBIN A1C
ESTIMATED AVERAGE GLUCOSE: 117 mg/dL
Hgb A1c MFr Bld: 5.7 % — ABNORMAL HIGH (ref 4.8–5.6)

## 2017-04-14 ENCOUNTER — Encounter: Payer: 59 | Attending: General Surgery | Admitting: Registered"

## 2017-04-14 DIAGNOSIS — R0683 Snoring: Secondary | ICD-10-CM | POA: Insufficient documentation

## 2017-04-14 DIAGNOSIS — E119 Type 2 diabetes mellitus without complications: Secondary | ICD-10-CM | POA: Insufficient documentation

## 2017-04-14 DIAGNOSIS — Z713 Dietary counseling and surveillance: Secondary | ICD-10-CM | POA: Insufficient documentation

## 2017-04-14 DIAGNOSIS — Z803 Family history of malignant neoplasm of breast: Secondary | ICD-10-CM | POA: Diagnosis not present

## 2017-04-14 DIAGNOSIS — Z90710 Acquired absence of both cervix and uterus: Secondary | ICD-10-CM | POA: Insufficient documentation

## 2017-04-14 DIAGNOSIS — K219 Gastro-esophageal reflux disease without esophagitis: Secondary | ICD-10-CM | POA: Insufficient documentation

## 2017-04-14 DIAGNOSIS — E669 Obesity, unspecified: Secondary | ICD-10-CM

## 2017-04-14 DIAGNOSIS — Z87442 Personal history of urinary calculi: Secondary | ICD-10-CM | POA: Insufficient documentation

## 2017-04-14 DIAGNOSIS — Z8719 Personal history of other diseases of the digestive system: Secondary | ICD-10-CM | POA: Insufficient documentation

## 2017-04-14 DIAGNOSIS — Z8041 Family history of malignant neoplasm of ovary: Secondary | ICD-10-CM | POA: Insufficient documentation

## 2017-04-14 DIAGNOSIS — Z823 Family history of stroke: Secondary | ICD-10-CM | POA: Insufficient documentation

## 2017-04-14 DIAGNOSIS — Z87891 Personal history of nicotine dependence: Secondary | ICD-10-CM | POA: Diagnosis not present

## 2017-04-14 DIAGNOSIS — Z8249 Family history of ischemic heart disease and other diseases of the circulatory system: Secondary | ICD-10-CM | POA: Insufficient documentation

## 2017-04-14 DIAGNOSIS — Z8261 Family history of arthritis: Secondary | ICD-10-CM | POA: Diagnosis not present

## 2017-04-14 DIAGNOSIS — E282 Polycystic ovarian syndrome: Secondary | ICD-10-CM | POA: Insufficient documentation

## 2017-04-14 DIAGNOSIS — I10 Essential (primary) hypertension: Secondary | ICD-10-CM | POA: Diagnosis not present

## 2017-04-14 DIAGNOSIS — E162 Hypoglycemia, unspecified: Secondary | ICD-10-CM | POA: Insufficient documentation

## 2017-04-14 DIAGNOSIS — Z9889 Other specified postprocedural states: Secondary | ICD-10-CM | POA: Diagnosis not present

## 2017-04-14 DIAGNOSIS — Z8042 Family history of malignant neoplasm of prostate: Secondary | ICD-10-CM | POA: Insufficient documentation

## 2017-04-14 DIAGNOSIS — J45909 Unspecified asthma, uncomplicated: Secondary | ICD-10-CM | POA: Insufficient documentation

## 2017-04-14 DIAGNOSIS — Z79899 Other long term (current) drug therapy: Secondary | ICD-10-CM | POA: Diagnosis not present

## 2017-04-14 DIAGNOSIS — Z811 Family history of alcohol abuse and dependence: Secondary | ICD-10-CM | POA: Insufficient documentation

## 2017-04-14 DIAGNOSIS — R609 Edema, unspecified: Secondary | ICD-10-CM | POA: Diagnosis not present

## 2017-04-14 DIAGNOSIS — Z888 Allergy status to other drugs, medicaments and biological substances status: Secondary | ICD-10-CM | POA: Diagnosis not present

## 2017-04-17 NOTE — Progress Notes (Signed)
Bariatric Class:  Appt start time: 1530 end time:  1630.  2 Week Post-Operative Nutrition Class  Patient was seen on 04/14/2017 for Post[facility]ative [facility]   Surgery date: 03/31/2017 Surgery type: rygb Start weight at [facility]: 311.0 Weight today: 293.0 Weight change: 18 lbs loss  Pt reports some headaches and some dizziness/lightheadedness. Pt stats she has had some gas. Pt reports recent A1c of 5.7.   TANITA  BODY COMP RESULTS  04/14/2017   BMI (kg/m^2) 50.3   Fat Mass (lbs) 159.0   Fat Free Mass (lbs) 134.0   Total Body Water (lbs) 100.0   The following the learning objectives were met by the patient during this course:  Identifies Phase 3A (Soft, High Proteins) Dietary Goals and will begin from 2 weeks post-operatively to 2 months post-operatively  Identifies appropriate sources of fluids and proteins   States protein recommendations and appropriate sources post-operatively  Identifies the need for appropriate texture modifications, mastication, and bite sizes when consuming solids  Identifies appropriate multivitamin and calcium sources post-operatively  Describes the need for physical activity post-operatively and will follow MD recommendations  States when to call healthcare provider regarding medication questions or post-operative complications  Handouts given during class include:  Phase 3A: Soft, High Protein Diet Handout  Follow-Up Plan: Patient will follow-up at [facility] in 6 weeks for 2 month post-op nutrition visit for diet advancement per MD.

## 2017-04-22 ENCOUNTER — Other Ambulatory Visit: Payer: Self-pay

## 2017-04-22 ENCOUNTER — Encounter (HOSPITAL_COMMUNITY): Payer: Self-pay

## 2017-04-22 ENCOUNTER — Emergency Department (HOSPITAL_COMMUNITY)
Admission: EM | Admit: 2017-04-22 | Discharge: 2017-04-22 | Disposition: A | Discharge status: Home / Self Care | Payer: 59 | Attending: Emergency Medicine | Admitting: Emergency Medicine

## 2017-04-22 ENCOUNTER — Emergency Department (HOSPITAL_COMMUNITY): Payer: 59

## 2017-04-22 DIAGNOSIS — I10 Essential (primary) hypertension: Secondary | ICD-10-CM | POA: Diagnosis not present

## 2017-04-22 DIAGNOSIS — R112 Nausea with vomiting, unspecified: Secondary | ICD-10-CM | POA: Diagnosis not present

## 2017-04-22 DIAGNOSIS — Z9884 Bariatric surgery status: Secondary | ICD-10-CM | POA: Diagnosis not present

## 2017-04-22 DIAGNOSIS — R1031 Right lower quadrant pain: Secondary | ICD-10-CM | POA: Insufficient documentation

## 2017-04-22 DIAGNOSIS — Z79899 Other long term (current) drug therapy: Secondary | ICD-10-CM | POA: Diagnosis not present

## 2017-04-22 DIAGNOSIS — R1110 Vomiting, unspecified: Secondary | ICD-10-CM | POA: Diagnosis not present

## 2017-04-22 DIAGNOSIS — R197 Diarrhea, unspecified: Secondary | ICD-10-CM | POA: Insufficient documentation

## 2017-04-22 DIAGNOSIS — Z87891 Personal history of nicotine dependence: Secondary | ICD-10-CM | POA: Insufficient documentation

## 2017-04-22 DIAGNOSIS — K921 Melena: Secondary | ICD-10-CM | POA: Insufficient documentation

## 2017-04-22 DIAGNOSIS — R109 Unspecified abdominal pain: Secondary | ICD-10-CM | POA: Diagnosis not present

## 2017-04-22 LAB — CBC WITH DIFFERENTIAL/PLATELET
Basophils Absolute: 0 10*3/uL (ref 0.0–0.1)
Basophils Relative: 0 %
EOS PCT: 0 %
Eosinophils Absolute: 0 10*3/uL (ref 0.0–0.7)
HCT: 37.6 % (ref 36.0–46.0)
HEMOGLOBIN: 12.1 g/dL (ref 12.0–15.0)
LYMPHS ABS: 1.3 10*3/uL (ref 0.7–4.0)
LYMPHS PCT: 13 %
MCH: 28.1 pg (ref 26.0–34.0)
MCHC: 32.2 g/dL (ref 30.0–36.0)
MCV: 87.2 fL (ref 78.0–100.0)
MONOS PCT: 2 %
Monocytes Absolute: 0.2 10*3/uL (ref 0.1–1.0)
Neutro Abs: 8 10*3/uL — ABNORMAL HIGH (ref 1.7–7.7)
Neutrophils Relative %: 85 %
PLATELETS: 265 10*3/uL (ref 150–400)
RBC: 4.31 MIL/uL (ref 3.87–5.11)
RDW: 14.6 % (ref 11.5–15.5)
WBC: 9.5 10*3/uL (ref 4.0–10.5)

## 2017-04-22 LAB — TYPE AND SCREEN
ABO/RH(D): O POS
ANTIBODY SCREEN: NEGATIVE

## 2017-04-22 LAB — COMPREHENSIVE METABOLIC PANEL
ALBUMIN: 4.2 g/dL (ref 3.5–5.0)
ALT: 15 U/L (ref 14–54)
AST: 15 U/L (ref 15–41)
Alkaline Phosphatase: 62 U/L (ref 38–126)
Anion gap: 11 (ref 5–15)
BUN: 15 mg/dL (ref 6–20)
CHLORIDE: 106 mmol/L (ref 101–111)
CO2: 23 mmol/L (ref 22–32)
CREATININE: 0.79 mg/dL (ref 0.44–1.00)
Calcium: 9.1 mg/dL (ref 8.9–10.3)
GFR calc Af Amer: >60 mL/min (ref >60)
Glucose, Bld: 120 mg/dL — ABNORMAL HIGH (ref 65–99)
POTASSIUM: 4.5 mmol/L (ref 3.5–5.1)
SODIUM: 140 mmol/L (ref 135–145)
Total Bilirubin: 0.2 mg/dL — ABNORMAL LOW (ref 0.3–1.2)
Total Protein: 7.5 g/dL (ref 6.5–8.1)

## 2017-04-22 LAB — POC OCCULT BLOOD, ED: FECAL OCCULT BLD: NEGATIVE

## 2017-04-22 LAB — I-STAT BETA HCG BLOOD, ED (MC, WL, AP ONLY): I-stat hCG, quantitative: <5 m[IU]/mL (ref <5)

## 2017-04-22 LAB — PROTIME-INR
INR: 1.04
Prothrombin Time: 13.5 seconds (ref 11.4–15.2)

## 2017-04-22 MED ORDER — IOPAMIDOL (ISOVUE-300) INJECTION 61%
100.0000 mL | Freq: Once | INTRAVENOUS | Status: AC | PRN
  Administered 2017-04-22: 100 mL via INTRAVENOUS

## 2017-04-22 MED ORDER — PROBIOTIC & ACIDOPHILUS EX ST PO CAPS: 1.0000 | ORAL_CAPSULE | Freq: Every day | ORAL | 0 refills | Status: DC

## 2017-04-22 MED ORDER — IOPAMIDOL (ISOVUE-300) INJECTION 61%
INTRAVENOUS | Status: AC
  Filled 2017-04-22: qty 100

## 2017-04-22 MED ORDER — ONDANSETRON 4 MG PO TBDP: 4.0000 mg | ORAL_TABLET | Freq: Three times a day (TID) | ORAL | 0 refills | Status: DC | PRN

## 2017-04-22 MED ORDER — SODIUM CHLORIDE 0.9 % IV SOLN
INTRAVENOUS | Status: DC
  Administered 2017-04-22: 08:00:00 via INTRAVENOUS

## 2017-04-22 MED ORDER — ONDANSETRON HCL 4 MG/2ML IJ SOLN
4.0000 mg | Freq: Once | INTRAMUSCULAR | Status: AC
  Administered 2017-04-22: 4 mg via INTRAVENOUS
  Filled 2017-04-22: qty 2

## 2017-04-22 MED ORDER — PROMETHAZINE HCL 25 MG/ML IJ SOLN
12.5000 mg | Freq: Once | INTRAMUSCULAR | Status: AC
  Administered 2017-04-22: 12.5 mg via INTRAVENOUS
  Filled 2017-04-22: qty 1

## 2017-04-22 MED ORDER — SODIUM CHLORIDE 0.9 % IV BOLUS (SEPSIS)
1000.0000 mL | Freq: Once | INTRAVENOUS | Status: AC
  Administered 2017-04-22: 1000 mL via INTRAVENOUS

## 2017-04-22 NOTE — ED Notes (Signed)
Pt made aware of need for stool sample. 

## 2017-04-22 NOTE — ED Triage Notes (Signed)
Pt had gastric by pass surgery on January 22ndm she has been fine since then At 1am she started vomiting and having diarrhea, she states she had a small amount of blood in her diarrhea

## 2017-04-22 NOTE — ED Notes (Signed)
IV placed but unable to collect blood.  Will ask staff to attempt.

## 2017-04-22 NOTE — ED Provider Notes (Signed)
[facility]-EMERGENCY DEPT Provider Note   CSN: [id number] Arrival date & time: 04/22/17  7829     History   Chief Complaint Chief Complaint  Patient presents with  . Vomiting    HPI Monica Hernandez is a 42 y.o. female.  Pt presents to the ED today with abdominal pain, n/v/d.  The pt had gastric bypass surgery on 1/22 by Dr. Sheliah Hatch.  She had been doing well from the surgery until this morning.  She woke up early this morning (0100) with vomiting and diarrhea.  She is now passing large clots of blood in her stool.  Pt complains of a burning/ripping pain in her lower abdomen.  She denies any f/c.      Past Medical History:  Diagnosis Date  . Allergy   . Anemia   . Asthma   . GERD (gastroesophageal reflux disease)   . History of kidney stones   . Hypertension   . Kidney stone   . Mitral valve prolapse    was told as a kid that she had this but during ECHO for eval, ECHO did not indicate this , see 08-25-16 epic result   . PONV (postoperative nausea and vomiting)    epidural with c section caused N/V   . Pre-diabetes   . Ulcer     Patient Active Problem List   Diagnosis Date Noted  . Morbid obesity (HCC) 03/31/2017    Past Surgical History:  Procedure Laterality Date  . ABDOMINAL HYSTERECTOMY    . ADENOIDECTOMY, TONSILLECTOMY AND MYRINGOTOMY WITH TUBE PLACEMENT    . CESAREAN SECTION    . FRACTURE SURGERY    . GASTRIC ROUX-EN-Y N/A 03/31/2017   Procedure: LAPAROSCOPIC ROUX-EN-Y GASTRIC BYPASS WITH UPPER ENDOSCOPY;  Surgeon: Sheliah Hatch, De Blanch, MD;  Location: [facility];  Service: General;  Laterality: N/A;  . LEG SURGERY Left   . TUBAL LIGATION      OB History    No data available       Home Medications    Prior to Admission medications   Medication Sig Start Date End Date Taking? Authorizing Provider  acetaminophen (TYLENOL) 500 MG tablet Take 1,000 mg by mouth every 6 (six) hours as needed for mild pain.   Yes [provider]  albuterol (PROVENTIL HFA;VENTOLIN HFA) 108 (90 Base) MCG/ACT inhaler Inhale 2 puffs into the lungs every 4 (four) hours as needed for wheezing or shortness of breath. 02/10/16  Yes Mabe, Onalee Hua, NP  Calcium-Phosphorus-Vitamin D (CALCIUM/VITAMIN D3/ADULT GUMMY) 250-100-500 MG-MG-UNIT CHEW Chew 2 tablets by mouth 3 (three) times daily.   Yes [provider]  Cholecalciferol 2000 units CAPS Take 2,000 Units by mouth daily.   Yes [provider]  dicyclomine (BENTYL) 10 MG capsule Take 1 capsule (10 mg total) by mouth 3 (three) times daily before meals. 07/16/16  Yes Meryl Dare, MD  lansoprazole (PREVACID) 30 MG capsule Take 1 capsule (30 mg total) by mouth daily. 07/16/16  Yes Meryl Dare, MD  Multiple Vitamins-Minerals (BARIATRIC MULTIVITAMINS/IRON PO) Take 2 tablets by mouth 2 (two) times daily. Chewable tablets   Yes [provider]  ondansetron (ZOFRAN ODT) 4 MG disintegrating tablet Take 1 tablet (4 mg total) by mouth every 8 (eight) hours as needed. 04/22/17   Jacalyn Lefevre, MD  Probiotic Product (PROBIOTIC & ACIDOPHILUS EX ST) CAPS Take 1 capsule by mouth daily. 04/22/17   Jacalyn Lefevre, MD    Family History Family History  Problem Relation Age of  Onset  . Hypertension Mother   . Stroke Mother   . Thyroid cancer Mother   . Stroke Father   . Lung cancer Father   . Brain cancer Father   . Bone cancer Father   . Hypertension Sister   . Heart disease Maternal Grandmother   . Diabetes Maternal Grandmother   . Asthma Other   . Cancer Other   . Stomach cancer Neg Hx   . Colon cancer Neg Hx     Social History Social History   Tobacco Use  . Smoking status: Former Smoker    Types: Cigarettes    Last attempt to quit: 2010    Years since quitting: 9.1  . Smokeless tobacco: Never Used  . Tobacco comment: smoked since age 22 ; quit in 2010   Substance Use Topics  . Alcohol use: No  . Drug use: No     Allergies   Contrave  [naltrexone-bupropion hcl er]; Erythromycin; Shellfish allergy; Zithromax [azithromycin]; Belviq [lorcaserin hcl]; Qsymia [phentermine-topiramate]; Saxenda [liraglutide -weight management]; Allegra-d [fexofenadine-pseudoephed er]; Naltrexone; Fish allergy; and Sulfa antibiotics   Review of Systems Review of Systems  Gastrointestinal: Positive for abdominal pain, blood in stool, diarrhea, nausea and vomiting.  All other systems reviewed and are negative.    Physical Exam Updated Vital Signs BP (!) 137/93 (BP Location: Left Arm)   Pulse 95   Temp 98 F (36.7 C) (Oral)   Resp 15   SpO2 96%   Physical Exam  Constitutional: She is oriented to person, place, and time. She appears well-developed and well-nourished.  HENT:  Head: Normocephalic and atraumatic.  Right Ear: External ear normal.  Left Ear: External ear normal.  Nose: Nose normal.  Mouth/Throat: Oropharynx is clear and moist.  Eyes: Conjunctivae and EOM are normal. Pupils are equal, round, and reactive to light.  Neck: Normal range of motion. Neck supple.  Cardiovascular: Regular rhythm, normal heart sounds and intact distal pulses. Tachycardia present.  Pulmonary/Chest: Effort normal and breath sounds normal.  Abdominal: Soft. Bowel sounds are normal. There is tenderness in the right lower quadrant.  Musculoskeletal: Normal range of motion.  Neurological: She is alert and oriented to person, place, and time.  Skin: Skin is warm. Capillary refill takes less than 2 seconds.  Psychiatric: She has a normal mood and affect. Her behavior is normal. Judgment and thought content normal.  Nursing note and vitals reviewed.    ED Treatments / Results  Labs (all labs ordered are listed, but only abnormal results are displayed) Labs Reviewed  COMPREHENSIVE METABOLIC PANEL - Abnormal; Notable for the following components:      Result Value   Glucose, Bld 120 (*)    Total Bilirubin 0.2 (*)    All other components within normal  limits  CBC WITH DIFFERENTIAL/PLATELET - Abnormal; Notable for the following components:   Neutro Abs 8.0 (*)    All other components within normal limits  GASTROINTESTINAL PANEL BY PCR, STOOL (REPLACES STOOL CULTURE)  C DIFFICILE QUICK SCREEN W PCR REFLEX  PROTIME-INR  I-STAT BETA HCG BLOOD, ED ([facility], [facility], [facility] ONLY)  POC OCCULT BLOOD, ED  TYPE AND SCREEN    EKG  EKG Interpretation None       Radiology Ct Abdomen Pelvis W Contrast  Result Date: 04/22/2017 CLINICAL DATA:  Lower abdominal pain, diarrhea, nausea/vomiting. Status post gastric bypass in January 2019. EXAM: CT ABDOMEN AND PELVIS WITH CONTRAST TECHNIQUE: Multidetector CT imaging of the abdomen and pelvis was performed using the standard  protocol following bolus administration of intravenous contrast. CONTRAST:  100 mL Isovue 300 IV COMPARISON:  None. FINDINGS: Lower chest: Lung bases are clear. Hepatobiliary: Liver is within normal limits. Gallbladder is unremarkable. No intrahepatic or extrahepatic ductal dilatation. Pancreas: Within normal limits. Spleen: Within normal limits. Adrenals/Urinary Tract: Adrenal glands are within normal limits. Kidneys are within normal limits.  No hydronephrosis. Bladder is within normal limits. Stomach/Bowel: Stomach is notable for per surgical changes related to gastric bypass with gastrojejunostomy. No evidence of bowel obstruction. Normal appendix (series 2/image 59). Vascular/Lymphatic: No evidence of abdominal aortic aneurysm. No suspicious abdominopelvic lymphadenopathy. Reproductive: Status post hysterectomy. No adnexal masses. Other: No abdominopelvic ascites. Musculoskeletal: Mild degenerative changes of the visualized thoracolumbar spine, most prominent at L5-S1. IMPRESSION: Postsurgical changes related to gastric bypass. No evidence of bowel obstruction. Normal appendix. Status post hysterectomy. No CT findings to account for the patient's lower abdominal pain. Electronically Signed   By:  Charline BillsSriyesh  Krishnan M.D.   On: 04/22/2017 09:13    Procedures Procedures (including critical care time)  Medications Ordered in ED Medications  sodium chloride 0.9 % bolus 1,000 mL (0 mLs Intravenous Stopped 04/22/17 0851)    And  0.9 %  sodium chloride infusion ( Intravenous Stopped 04/22/17 1128)  ondansetron (ZOFRAN) injection 4 mg (4 mg Intravenous Given 04/22/17 0733)  iopamidol (ISOVUE-300) 61 % injection 100 mL (100 mLs Intravenous Contrast Given 04/22/17 0850)  promethazine (PHENERGAN) injection 12.5 mg (12.5 mg Intravenous Given 04/22/17 1018)     Initial Impression / Assessment and Plan / ED Course  I have reviewed the triage vital signs and the nursing notes.  Pertinent labs & imaging results that were available during my care of the patient were reviewed by me and considered in my medical decision making (see chart for details).    Pt doing much better.  She is tolerating po fluids.  Vitals are good.  Suspect rectal bleeding from severe diarrhea.  No evidence of colitis on CT.  Pt is stable for d/c.  Final Clinical Impressions(s) / ED Diagnoses   Final diagnoses:  Nausea vomiting and diarrhea    ED Discharge Orders        Ordered    ondansetron (ZOFRAN ODT) 4 MG disintegrating tablet  Every 8 hours PRN     04/22/17 1219    Probiotic Product (PROBIOTIC & ACIDOPHILUS EX ST) CAPS  Daily     04/22/17 1219       Jacalyn LefevreHaviland, Stephaniemarie Stoffel, MD 04/22/17 1220

## 2017-04-24 ENCOUNTER — Ambulatory Visit: Payer: 59 | Admitting: Emergency Medicine

## 2017-04-24 ENCOUNTER — Inpatient Hospital Stay: Payer: 59 | Admitting: Physician Assistant

## 2017-04-27 ENCOUNTER — Ambulatory Visit: Payer: 59 | Admitting: Physician Assistant

## 2017-04-27 ENCOUNTER — Encounter: Payer: Self-pay | Admitting: Physician Assistant

## 2017-04-27 ENCOUNTER — Other Ambulatory Visit: Payer: Self-pay

## 2017-04-27 VITALS — BP 134/88 | HR 88 | Temp 98.7°F | Resp 16 | Ht 64.5 in | Wt 288.2 lb

## 2017-04-27 DIAGNOSIS — L03031 Cellulitis of right toe: Secondary | ICD-10-CM

## 2017-04-27 DIAGNOSIS — K529 Noninfective gastroenteritis and colitis, unspecified: Secondary | ICD-10-CM

## 2017-04-27 LAB — POCT URINALYSIS DIP (MANUAL ENTRY)
Bilirubin, UA: NEGATIVE
GLUCOSE UA: NEGATIVE mg/dL
LEUKOCYTES UA: NEGATIVE
NITRITE UA: NEGATIVE
Protein Ur, POC: NEGATIVE mg/dL
SPEC GRAV UA: 1.01 (ref 1.010–1.025)
Urobilinogen, UA: 0.2 E.U./dL
pH, UA: 7 (ref 5.0–8.0)

## 2017-04-27 MED ORDER — PROMETHAZINE HCL 25 MG PO TABS: 25.0000 mg | ORAL_TABLET | Freq: Four times a day (QID) | ORAL | 0 refills | Status: DC | PRN

## 2017-04-27 MED ORDER — CEPHALEXIN 500 MG PO CAPS: 500.0000 mg | ORAL_CAPSULE | Freq: Two times a day (BID) | ORAL | 0 refills | Status: DC

## 2017-04-27 MED FILL — PROMETHAZINE 25 MG TABLET: 25 | 8 days supply | Qty: 30 | Fill #0

## 2017-04-27 MED FILL — CEPHALEXIN 500 MG CAPSULE: 500 | 10 days supply | Qty: 20 | Fill #0

## 2017-04-27 NOTE — Progress Notes (Signed)
04/27/2017 8:31 AM   DOB: 07/04/1975 / MRN: [id number]  SUBJECTIVE:  Monica Hernandez is a 42 y.o. female presenting for recheck nausea vomiting diarrhea.  Was seen in the ED about 5 days ago and workup was reassuring there.  She has not been able to return to work without a note.  The symptoms have been complicated by recent gastric bypass surgery.  She tells me "I can keep about half of what I eat down."  She tells me that Zofran gives her a pounding headache and raises her blood pressure to 160 systolic however helps her keep fluids and solids down.  She is still having diarrhea however this is nonbloody.  She denies fever.  She complains of toe pain.  States that she had a hangnail about the medial aspect of the right great toe which she "pulled off" and since that time she has had exquisite tenderness about the toe.  She has been putting ointment on the toe and feels this is not really helped.  She is allergic to contrave [naltrexone-bupropion hcl er]; erythromycin; shellfish allergy; zithromax [azithromycin]; belviq [lorcaserin hcl]; qsymia [phentermine-topiramate]; saxenda [liraglutide -weight management]; allegra-d [fexofenadine-pseudoephed er]; naltrexone; fish allergy; and sulfa antibiotics.   She  has a past medical history of Allergy, Anemia, Asthma, GERD (gastroesophageal reflux disease), History of kidney stones, Hypertension, Kidney stone, Mitral valve prolapse, PONV (postoperative nausea and vomiting), Pre-diabetes, and Ulcer.    She  reports that she quit smoking about 9 years ago. Her smoking use included cigarettes. she has never used smokeless tobacco. She reports that she does not drink alcohol or use drugs. She  reports that she currently engages in sexual activity and has had partners who are Female. She reports using the following method of birth control/protection: Surgical. The patient  has a past surgical history that includes Tubal ligation; Adenoidectomy, tonsillectomy  and myringotomy with tube placement; Abdominal hysterectomy; Leg Surgery (Left); Cesarean section; Fracture surgery; and Gastric Roux-En-Y (N/A, 03/31/2017).  Her family history includes Asthma in her other; Bone cancer in her father; Brain cancer in her father; Cancer in her other; Diabetes in her maternal grandmother; Heart disease in her maternal grandmother; Hypertension in her mother and sister; Lung cancer in her father; Stroke in her father and mother; Thyroid cancer in her mother.  Review of Systems  Constitutional: Negative for chills, diaphoresis and fever.  Respiratory: Negative for cough, hemoptysis, sputum production, shortness of breath and wheezing.   Cardiovascular: Negative for chest pain, orthopnea and leg swelling.  Gastrointestinal: Positive for diarrhea. Negative for blood in stool, melena and nausea.  Skin: Negative for rash.  Neurological: Negative for dizziness.    The problem list and medications were reviewed and updated by myself where necessary and exist elsewhere in the encounter.   OBJECTIVE:  BP 134/88 (BP Location: Right Arm, Patient Position: Sitting, Cuff Size: Large)   Pulse 88   Temp 98.7 F (37.1 C) (Oral)   Resp 16   Ht 5' 4.5" (1.638 m)   Wt 288 lb 3.2 oz (130.7 kg)   SpO2 98%   BMI 48.71 kg/m   Physical Exam  Constitutional: She is active.  Non-toxic appearance.  Cardiovascular: Normal rate.  Pulmonary/Chest: Effort normal. No tachypnea.  Abdominal: Soft. Bowel sounds are normal. She exhibits no distension and no mass. There is no tenderness. There is no rebound and no guarding.  Musculoskeletal: Normal range of motion. She exhibits tenderness ( Ask tenderness about the medial aspect of the  right great toe. ). She exhibits no edema or deformity.  Neurological: She is alert.  Skin: Skin is warm and dry. She is not diaphoretic. No pallor.    Results for orders placed or performed in visit on 04/27/17 (from the past 72 hour(s))  POCT urinalysis  dipstick     Status: Abnormal   Collection Time: 04/27/17  8:29 AM  Result Value Ref Range   Color, UA yellow yellow   Clarity, UA clear clear   Glucose, UA negative negative mg/dL   Bilirubin, UA negative negative   Ketones, POC UA small (15) (A) negative mg/dL   Spec Grav, UA 1.6101.010 [id number] - 1.025   Blood, UA trace-lysed (A) negative   pH, UA 7.0 5.0 - 8.0   Protein Ur, POC negative negative mg/dL   Urobilinogen, UA 0.2 0.2 or 1.0 E.U./dL   Nitrite, UA Negative Negative   Leukocytes, UA Negative Negative    No results found.  ASSESSMENT AND PLAN:  Nelva Bushorma was seen today for hospitalization follow-up.  Diagnoses and all orders for this visit:  Gastroenteritis -     POCT urinalysis dipstick -     GI Profile, Stool, PCR  Paronychia of great toe, right -     cephALEXin (KEFLEX) 500 MG capsule; Take 1 capsule (500 mg total) by mouth 2 (two) times daily for 10 days. -     promethazine (PHENERGAN) 25 MG tablet; Take 1 tablet (25 mg total) by mouth every 6 (six) hours as needed for nausea or vomiting.    The patient is advised to call or return to clinic if she does not see an improvement in symptoms, or to seek the care of the closest emergency department if she worsens with the above plan.   Deliah BostonMichael Clark, MHS, PA-C [facility] 04/27/2017 8:31 AM

## 2017-04-27 NOTE — Patient Instructions (Signed)
     IF you received an x-ray today, you will receive an invoice from [facility]. Please contact [facility] at [phone] with questions or concerns regarding your invoice.   IF you received labwork today, you will receive an invoice from [facility]. Please contact [facility] at [phone] with questions or concerns regarding your invoice.   Our billing staff will not be able to assist you with questions regarding bills from these companies.  You will be contacted with the lab results as soon as they are available. The fastest way to get your results is to activate your My Chart account. Instructions are located on the last page of this paperwork. If you have not heard from us regarding the results in 2 weeks, please contact this office.     

## 2017-04-28 NOTE — Telephone Encounter (Signed)
Patient needs FMLA forms completed by Deliah BostonMichael Clark, I have completed what I could from the OV notes and highlighted the areas that need to be updated I will place the forms in Michael's box on 04/28/17 please return to the FMLA/Disability box at the 102 checkout desk within 5-7 business days. Thank you!

## 2017-04-30 NOTE — Telephone Encounter (Signed)
Paperwork scanned and faxed on 04/30/17

## 2017-05-02 ENCOUNTER — Encounter: Payer: Self-pay | Admitting: Physician Assistant

## 2017-05-07 NOTE — Telephone Encounter (Signed)
[employer] is telling pt they still do not have the FMLA paperwork. Pt is requesting that we refax it to Fax [phone]. She is a [employer], [employer] employee and concerned about getting in trouble. Please advise pt once completed (msg in mychart is fine).

## 2017-05-07 NOTE — Telephone Encounter (Signed)
Paperwork was faxed to the number provider on 05/07/17

## 2017-05-08 ENCOUNTER — Other Ambulatory Visit: Payer: Self-pay

## 2017-05-08 ENCOUNTER — Encounter: Payer: Self-pay | Admitting: Physician Assistant

## 2017-05-08 ENCOUNTER — Ambulatory Visit: Payer: 59 | Admitting: Physician Assistant

## 2017-05-08 VITALS — BP 144/84 | HR 73 | Temp 98.0°F | Resp 16 | Ht 64.5 in | Wt 284.0 lb

## 2017-05-08 DIAGNOSIS — L03031 Cellulitis of right toe: Secondary | ICD-10-CM | POA: Diagnosis not present

## 2017-05-08 DIAGNOSIS — Z713 Dietary counseling and surveillance: Secondary | ICD-10-CM | POA: Diagnosis not present

## 2017-05-08 MED ORDER — CEPHALEXIN 500 MG PO CAPS: 500.0000 mg | ORAL_CAPSULE | Freq: Four times a day (QID) | ORAL | 0 refills | Status: DC

## 2017-05-08 MED ORDER — CEPHALEXIN 500 MG PO CAPS: 500.0000 mg | ORAL_CAPSULE | Freq: Four times a day (QID) | ORAL | 0 refills | Status: AC

## 2017-05-08 NOTE — Patient Instructions (Signed)
     IF you received an x-ray today, you will receive an invoice from [facility]. Please contact [facility] at [phone] with questions or concerns regarding your invoice.   IF you received labwork today, you will receive an invoice from [facility]. Please contact [facility] at [phone] with questions or concerns regarding your invoice.   Our billing staff will not be able to assist you with questions regarding bills from these companies.  You will be contacted with the lab results as soon as they are available. The fastest way to get your results is to activate your My Chart account. Instructions are located on the last page of this paperwork. If you have not heard from us regarding the results in 2 weeks, please contact this office.     

## 2017-05-10 NOTE — Progress Notes (Signed)
05/10/2017 9:27 AM   DOB: 1975/05/27 / MRN: [id number]  SUBJECTIVE:  Monica Hernandez is a 42 y.o. female presenting for right medial toenail pain.  This is been ongoing issue for the last 3 weeks.  Last time I saw the patient in clinic I started her on Keflex.  She emailed me reporting that the pain was persisting and thus I advised her to come in for toenail removal.  She has been using Epsom salt soaks in warm water and this has been helping.  She tells me there is still pus coming from under the nail.  She is allergic to contrave [naltrexone-bupropion hcl er]; erythromycin; shellfish allergy; zithromax [azithromycin]; belviq [lorcaserin hcl]; qsymia [phentermine-topiramate]; saxenda [liraglutide -weight management]; allegra-d [fexofenadine-pseudoephed er]; naltrexone; fish allergy; and sulfa antibiotics.   She  has a past medical history of Allergy, Anemia, Asthma, GERD (gastroesophageal reflux disease), History of kidney stones, Hypertension, Kidney stone, Mitral valve prolapse, PONV (postoperative nausea and vomiting), Pre-diabetes, and Ulcer.    She  reports that she quit smoking about 9 years ago. Her smoking use included cigarettes. she has never used smokeless tobacco. She reports that she does not drink alcohol or use drugs. She  reports that she currently engages in sexual activity and has had partners who are Female. She reports using the following method of birth control/protection: Surgical. The patient  has a past surgical history that includes Tubal ligation; Adenoidectomy, tonsillectomy and myringotomy with tube placement; Abdominal hysterectomy; Leg Surgery (Left); Cesarean section; Fracture surgery; and Gastric Roux-En-Y (N/A, 03/31/2017).  Her family history includes Asthma in her other; Bone cancer in her father; Brain cancer in her father; Cancer in her other; Diabetes in her maternal grandmother; Heart disease in her maternal grandmother; Hypertension in her mother and sister;  Lung cancer in her father; Stroke in her father and mother; Thyroid cancer in her mother.  ROS  As per HPI otherwise negative  The problem list and medications were reviewed and updated by myself where necessary and exist elsewhere in the encounter.   OBJECTIVE:  BP (!) 144/84 (BP Location: Right Arm, Patient Position: Sitting, Cuff Size: Large)   Pulse 73   Temp 98 F (36.7 C) (Oral)   Resp 16   Ht 5' 4.5" (1.638 m)   Wt 284 lb (128.8 kg)   SpO2 96%   BMI 48.00 kg/m   Physical Exam  Vitals reviewed.  Patient in no apparent distress.  Right toenail exquisitely tender medially.  There is warmth and erythema.  Procedure the patient was anesthetized using 5 cc of Marcaine via digital block both medially and laterally.  Further local infiltration was required distally to achieve adequate anesthesia.  This was achieved with 3 cc of Marcaine directly into the distal great toe.  Using a nail lifter the ingrown toenail was identified and clipped away.  ASSESSMENT AND PLAN:  Monica Hernandez was seen today for ingrown toenail.  Diagnoses and all orders for this visit:  Infection of nail bed of toe of right foot: I would have liked to have taken the medial toenail as this is curving and and into the toe.  However I could not achieve adequate anesthesia today.  This is most likely secondary to shin.  I am putting her back on Keflex.  She will check back in with me in about 2 weeks at which point if she still in pain will consider a referral to podiatry or trying the procedure again after the infection is down  staged. -     Discontinue: cephALEXin (KEFLEX) 500 MG capsule; Take 1 capsule (500 mg total) by mouth 4 (four) times daily for 10 days. -     cephALEXin (KEFLEX) 500 MG capsule; Take 1 capsule (500 mg total) by mouth 4 (four) times daily for 10 days.    The patient is advised to call or return to clinic if she does not see an improvement in symptoms, or to seek the care of the closest emergency  department if she worsens with the above plan.   Deliah Boston, MHS, PA-C Primary Care at [facility] 05/10/2017 9:27 AM

## 2017-05-27 ENCOUNTER — Encounter: Payer: Self-pay | Admitting: Registered"

## 2017-05-27 ENCOUNTER — Encounter: Payer: 59 | Attending: General Surgery | Admitting: Registered"

## 2017-05-27 DIAGNOSIS — Z8719 Personal history of other diseases of the digestive system: Secondary | ICD-10-CM | POA: Insufficient documentation

## 2017-05-27 DIAGNOSIS — Z803 Family history of malignant neoplasm of breast: Secondary | ICD-10-CM | POA: Diagnosis not present

## 2017-05-27 DIAGNOSIS — I10 Essential (primary) hypertension: Secondary | ICD-10-CM | POA: Insufficient documentation

## 2017-05-27 DIAGNOSIS — Z888 Allergy status to other drugs, medicaments and biological substances status: Secondary | ICD-10-CM | POA: Insufficient documentation

## 2017-05-27 DIAGNOSIS — Z90710 Acquired absence of both cervix and uterus: Secondary | ICD-10-CM | POA: Insufficient documentation

## 2017-05-27 DIAGNOSIS — E669 Obesity, unspecified: Secondary | ICD-10-CM

## 2017-05-27 DIAGNOSIS — Z823 Family history of stroke: Secondary | ICD-10-CM | POA: Insufficient documentation

## 2017-05-27 DIAGNOSIS — J45909 Unspecified asthma, uncomplicated: Secondary | ICD-10-CM | POA: Insufficient documentation

## 2017-05-27 DIAGNOSIS — E282 Polycystic ovarian syndrome: Secondary | ICD-10-CM | POA: Insufficient documentation

## 2017-05-27 DIAGNOSIS — Z8249 Family history of ischemic heart disease and other diseases of the circulatory system: Secondary | ICD-10-CM | POA: Diagnosis not present

## 2017-05-27 DIAGNOSIS — Z8261 Family history of arthritis: Secondary | ICD-10-CM | POA: Insufficient documentation

## 2017-05-27 DIAGNOSIS — R0683 Snoring: Secondary | ICD-10-CM | POA: Diagnosis not present

## 2017-05-27 DIAGNOSIS — E162 Hypoglycemia, unspecified: Secondary | ICD-10-CM | POA: Insufficient documentation

## 2017-05-27 DIAGNOSIS — Z713 Dietary counseling and surveillance: Secondary | ICD-10-CM | POA: Insufficient documentation

## 2017-05-27 DIAGNOSIS — Z87891 Personal history of nicotine dependence: Secondary | ICD-10-CM | POA: Diagnosis not present

## 2017-05-27 DIAGNOSIS — Z79899 Other long term (current) drug therapy: Secondary | ICD-10-CM | POA: Insufficient documentation

## 2017-05-27 DIAGNOSIS — Z9889 Other specified postprocedural states: Secondary | ICD-10-CM | POA: Diagnosis not present

## 2017-05-27 DIAGNOSIS — Z8042 Family history of malignant neoplasm of prostate: Secondary | ICD-10-CM | POA: Insufficient documentation

## 2017-05-27 DIAGNOSIS — Z8041 Family history of malignant neoplasm of ovary: Secondary | ICD-10-CM | POA: Insufficient documentation

## 2017-05-27 DIAGNOSIS — R609 Edema, unspecified: Secondary | ICD-10-CM | POA: Insufficient documentation

## 2017-05-27 DIAGNOSIS — Z87442 Personal history of urinary calculi: Secondary | ICD-10-CM | POA: Diagnosis not present

## 2017-05-27 DIAGNOSIS — E119 Type 2 diabetes mellitus without complications: Secondary | ICD-10-CM | POA: Diagnosis not present

## 2017-05-27 DIAGNOSIS — Z811 Family history of alcohol abuse and dependence: Secondary | ICD-10-CM | POA: Insufficient documentation

## 2017-05-27 DIAGNOSIS — K219 Gastro-esophageal reflux disease without esophagitis: Secondary | ICD-10-CM | POA: Insufficient documentation

## 2017-05-27 NOTE — Progress Notes (Signed)
Follow-up visit: 8 Weeks Post-Operative RYGB Surgery  Medical Nutrition Therapy:  Appt start time: 4:50 end time:  5:30.  Primary concerns today: Post-operative Bariatric Surgery Nutrition Management.  Non scale victories: more energy, wearing smaller sizes, increased walking (almost walking 3 miles a day), feeling better, less winded when taking the stairs, less knee pain  Surgery date: 03/31/2017 Surgery type: rygb Start weight at [facility]: 311.0 Weight today: 277.4 lbs Weight change: 15.6 lbs loss from 293.0 (04/14/2017) Total weight lost: 33.6 lbs Weight loss goal: to be healthy, not winded when taking stairs, improve bone and joint pain, does not want to take insulin   TANITA  BODY COMP RESULTS  04/14/2017 05/27/2017   BMI (kg/m^2) 50.3 46.9   Fat Mass (lbs) 159.0 141.2   Fat Free Mass (lbs) 134.0 136.2   Total Body Water (lbs) 100.0 101.0    Pt states she had the flu. Pt states she does not tolerate Premier Protein well anymore; drinks Muscle Milk instead. Pt states she drinks almost 1 gallon a day; has container at home and at work. Pt states she is eating about 1 1/2-2 oz of food, 5 times a day. Pt states she loves her air fryer; it works well for family too. Pt states eggs and aspartame make her sick. Pt states she gets hiccups when she eats cottage cheese and greek yogurt.   Preferred Learning Style:   No preference indicated   Learning Readiness:   Ready  Change in progress  24-hr recall: B (AM): Muscle Milk shake (20g) Snk (AM): 1/2 greek yogurt (6-7g)  L (PM): 1-2 oz deli meat (7-14g) Snk (PM): 2 oz cottage cheese  (14g) D (PM): 1 1/2-2 oz pork chops, chicken (10.5-14g) Snk (PM): none  Fluid intake: water (80 oz), lemon tea; 64+ ounces Estimated total protein intake: 60+ grams  Medications: See list Supplementation: Bariatric Advantage + Viactiv   CBG monitoring: N/A Average CBG per patient: N/A Last patient reported A1c: 5.7 from 04/10/2017  Using  straws: no Drinking while eating: no Having you been chewing well: yes Chewing/swallowing difficulties: no Changes in vision: no Changes to mood/headaches: no Hair loss/Changes to skin/Changes to nails: growing more, no, growing more Any difficulty focusing or concentrating: no, no Sweating: no Dizziness/Lightheaded: no Palpitations: no  Carbonated beverages: no N/V/D/C/GAS: no, no, no, no, no Abdominal Pain: no Dumping syndrome: no Last Lap-Band fill: N/A  Recent physical activity:  Walking 3 mi, 5-7 days/week  Progress Towards Goal(s):  In progress.  Handouts given during visit include:  Phase IV: High Protein + Non-starchy vegetables   Nutritional Diagnosis:  [place]-3.3 Overweight/obesity related to past poor dietary habits and physical inactivity as evidenced by patient w/ recent RYGB surgery following dietary guidelines for continued weight loss.     Intervention:  Nutrition education and counseling.  Goals:  Follow Phase 3B: High Protein + Non-Starchy Vegetables  Eat 3-6 small meals/snacks, every 3-5 hrs  Increase lean protein foods to meet 60g goal  Increase fluid intake to 64oz +  Avoid drinking 15 minutes before, during and 30 minutes after eating  Aim for >30 min of physical activity daily  - Try Lactaid 1% milk.   Teaching Method Utilized:  Visual Auditory Hands on  Barriers to learning/adherence to lifestyle change: none identified  Demonstrated degree of understanding via:  Teach Back   Monitoring/Evaluation:  Dietary intake, exercise, lap band fills, and body weight. Follow up in 4 months for 6 month post-op visit.

## 2017-05-27 NOTE — Patient Instructions (Addendum)
Goals:  Follow Phase 3B: High Protein + Non-Starchy Vegetables  Eat 3-6 small meals/snacks, every 3-5 hrs  Increase lean protein foods to meet 60g goal  Increase fluid intake to 64oz +  Avoid drinking 15 minutes before, during and 30 minutes after eating  Aim for >30 min of physical activity daily  - Try Lactaid 1% milk.

## 2017-05-29 ENCOUNTER — Ambulatory Visit: Payer: 59 | Admitting: Physician Assistant

## 2017-06-02 MED FILL — LANSOPRAZOLE DR 30 MG CAP: 30 | 30 days supply | Qty: 30 | Fill #4

## 2017-06-02 MED FILL — DICYCLOMINE 10 MG CAPSULE: 10 | 30 days supply | Qty: 90 | Fill #3

## 2017-06-17 ENCOUNTER — Encounter: Payer: Self-pay | Admitting: Physician Assistant

## 2017-06-24 ENCOUNTER — Telehealth: Payer: 59 | Admitting: Family

## 2017-06-24 DIAGNOSIS — R399 Unspecified symptoms and signs involving the genitourinary system: Secondary | ICD-10-CM | POA: Diagnosis not present

## 2017-06-24 MED ORDER — CEPHALEXIN 500 MG PO CAPS: 500.0000 mg | ORAL_CAPSULE | Freq: Two times a day (BID) | ORAL | 0 refills | Status: DC

## 2017-06-24 MED FILL — CEPHALEXIN 500 MG CAPSULE: 500 | 7 days supply | Qty: 14 | Fill #0

## 2017-06-24 NOTE — Progress Notes (Signed)

## 2017-06-25 ENCOUNTER — Encounter: Payer: Self-pay | Admitting: Physician Assistant

## 2017-07-06 ENCOUNTER — Ambulatory Visit (INDEPENDENT_AMBULATORY_CARE_PROVIDER_SITE_OTHER): Payer: 59 | Admitting: Physician Assistant

## 2017-07-06 ENCOUNTER — Encounter: Payer: Self-pay | Admitting: Physician Assistant

## 2017-07-06 VITALS — BP 108/81 | HR 66 | Temp 98.5°F | Resp 16 | Ht 65.0 in | Wt 265.8 lb

## 2017-07-06 DIAGNOSIS — R1013 Epigastric pain: Secondary | ICD-10-CM

## 2017-07-06 DIAGNOSIS — Z13228 Encounter for screening for other metabolic disorders: Secondary | ICD-10-CM

## 2017-07-06 DIAGNOSIS — Z1321 Encounter for screening for nutritional disorder: Secondary | ICD-10-CM

## 2017-07-06 DIAGNOSIS — Z1239 Encounter for other screening for malignant neoplasm of breast: Secondary | ICD-10-CM

## 2017-07-06 DIAGNOSIS — Z0000 Encounter for general adult medical examination without abnormal findings: Secondary | ICD-10-CM

## 2017-07-06 DIAGNOSIS — Z130 Encounter for screening for diseases of the blood and blood-forming organs and certain disorders involving the immune mechanism: Secondary | ICD-10-CM | POA: Diagnosis not present

## 2017-07-06 DIAGNOSIS — Z1231 Encounter for screening mammogram for malignant neoplasm of breast: Secondary | ICD-10-CM

## 2017-07-06 DIAGNOSIS — Z1329 Encounter for screening for other suspected endocrine disorder: Secondary | ICD-10-CM

## 2017-07-06 DIAGNOSIS — Z113 Encounter for screening for infections with a predominantly sexual mode of transmission: Secondary | ICD-10-CM

## 2017-07-06 DIAGNOSIS — K319 Disease of stomach and duodenum, unspecified: Secondary | ICD-10-CM | POA: Diagnosis not present

## 2017-07-06 MED ORDER — LANSOPRAZOLE 30 MG PO CPDR: 30.0000 mg | DELAYED_RELEASE_CAPSULE | Freq: Every day | ORAL | 3 refills | Status: DC

## 2017-07-06 MED ORDER — DICYCLOMINE HCL 10 MG PO CAPS: 10.0000 mg | ORAL_CAPSULE | Freq: Three times a day (TID) | ORAL | 3 refills | Status: DC

## 2017-07-06 MED FILL — LANSOPRAZOLE DR 30 MG CAP: 30 | 90 days supply | Qty: 90 | Fill #0

## 2017-07-06 MED FILL — DICYCLOMINE 10 MG CAPSULE: 10 | 90 days supply | Qty: 270 | Fill #0

## 2017-07-06 NOTE — Progress Notes (Signed)
07/06/2017 8:48 AM   DOB: 09-05-1975 / MRN: [id number]  SUBJECTIVE:  Monica Hernandez is a 42 y.o. female presenting for annual exam.  She feels well today.  She denies a history of bladder, colon, GYN cancer.  Mother with a history of breast cancer status post lumpectomy.  Father with a history of 2 MIs however was a 4 pack a day smoker and alcoholic.  Mother with a history of hypertension and stroke.  Immunization History  Administered Date(s) Administered  . Influenza-Unspecified 12/01/2016  . Tdap 10/10/2013     She is allergic to contrave [naltrexone-bupropion hcl er]; erythromycin; shellfish allergy; zithromax [azithromycin]; belviq [lorcaserin hcl]; qsymia [phentermine-topiramate]; saxenda [liraglutide -weight management]; allegra-d [fexofenadine-pseudoephed er]; naltrexone; fish allergy; and sulfa antibiotics.   She  has a past medical history of Allergy, Anemia, Asthma, GERD (gastroesophageal reflux disease), History of kidney stones, Hypertension, Kidney stone, Mitral valve prolapse, PONV (postoperative nausea and vomiting), Pre-diabetes, and Ulcer.    She  reports that she quit smoking about 9 years ago. Her smoking use included cigarettes. She has never used smokeless tobacco. She reports that she does not drink alcohol or use drugs. She  reports that she currently engages in sexual activity and has had partners who are Female. She reports using the following method of birth control/protection: Surgical. The patient  has a past surgical history that includes Tubal ligation; Adenoidectomy, tonsillectomy and myringotomy with tube placement; Abdominal hysterectomy; Leg Surgery (Left); Cesarean section; Fracture surgery; and Gastric Roux-En-Y (N/A, 03/31/2017).  Her family history includes Asthma in her other; Bone cancer in her father; Brain cancer in her father; Cancer in her other; Diabetes in her maternal grandmother; Heart disease in her maternal grandmother; Hypertension in her  mother and sister; Lung cancer in her father; Stroke in her father and mother; Thyroid cancer in her mother.  Review of Systems  Constitutional: Negative for chills, diaphoresis and fever.  Eyes: Negative.   Respiratory: Negative for cough, hemoptysis, sputum production, shortness of breath and wheezing.   Cardiovascular: Negative for chest pain, orthopnea and leg swelling.  Gastrointestinal: Negative for abdominal pain, blood in stool, constipation, diarrhea, heartburn, melena, nausea and vomiting.  Genitourinary: Negative for dysuria, flank pain, frequency, hematuria and urgency.  Skin: Negative for rash.  Neurological: Negative for dizziness, sensory change, speech change, focal weakness and headaches.    The problem list and medications were reviewed and updated by myself where necessary and exist elsewhere in the encounter.   OBJECTIVE:  BP 108/81   Pulse 66   Temp 98.5 F (36.9 C) (Oral)   Resp 16   Ht  (1.651 m)   Wt 265 lb 12.8 oz (120.6 kg)   SpO2 98%   BMI 44.23 kg/m   Wt Readings from Last 3 Encounters:  07/06/17 265 lb 12.8 oz (120.6 kg)  05/27/17 277 lb 6.4 oz (125.8 kg)  05/08/17 284 lb (128.8 kg)     Physical Exam  Constitutional: She is oriented to person, place, and time. She appears well-developed and well-nourished. No distress.  Eyes: Pupils are equal, round, and reactive to light. EOM are normal.  Cardiovascular: Normal rate, regular rhythm, S1 normal, S2 normal, normal heart sounds and intact distal pulses. Exam reveals no gallop, no friction rub and no decreased pulses.  No murmur heard. Pulmonary/Chest: Effort normal. No stridor. No respiratory distress. She has no wheezes. She has no rales.  Abdominal: She exhibits no distension.  Musculoskeletal: Normal range of motion. She exhibits  no edema.  Neurological: She is alert and oriented to person, place, and time. She has normal strength and normal reflexes. She is not disoriented. She displays no  atrophy. No cranial nerve deficit or sensory deficit. She exhibits normal muscle tone. Coordination and gait normal.  Skin: Skin is warm and dry. She is not diaphoretic.  Psychiatric: She has a normal mood and affect. Her behavior is normal.  Vitals reviewed.   No results found for this or any previous visit (from the past 72 hour(s)).  No results found.  ASSESSMENT AND PLAN:   Monica Hernandez was seen today for annual exam and medication refill.  Diagnoses and all orders for this visit:  Annual physical exam  Screening for endocrine, nutritional, metabolic and immunity disorder -     CBC -     Hemoglobin A1c -     Basic metabolic panel -     Hepatic function panel -     Lipid panel -     TSH -     Iron, TIBC and Ferritin Panel -     Vitamin D, 25-hydroxy  Screening breast examination -     MM DIGITAL SCREENING BILATERAL; Future  Routine screening for STI (sexually transmitted infection) -     HIV antibody -     GC/Chlamydia Probe Amp  Dyspepsia and disorder of function of stomach -     lansoprazole (PREVACID) 30 MG capsule; Take 1 capsule (30 mg total) by mouth daily. -     dicyclomine (BENTYL) 10 MG capsule; Take 1 capsule (10 mg total) by mouth 3 (three) times daily before meals.    The patient is advised to call or return to clinic if she does not see an improvement in symptoms, or to seek the care of the closest emergency department if she worsens with the above plan.   Deliah Boston, MHS, PA-C Primary Care at [facility] 07/06/2017 8:48 AM

## 2017-07-06 NOTE — Patient Instructions (Signed)
     IF you received an x-ray today, you will receive an invoice from [facility]. Please contact [facility] at [phone] with questions or concerns regarding your invoice.   IF you received labwork today, you will receive an invoice from [facility]. Please contact [facility] at [phone] with questions or concerns regarding your invoice.   Our billing staff will not be able to assist you with questions regarding bills from these companies.  You will be contacted with the lab results as soon as they are available. The fastest way to get your results is to activate your My Chart account. Instructions are located on the last page of this paperwork. If you have not heard from us regarding the results in 2 weeks, please contact this office.     

## 2017-07-06 NOTE — Addendum Note (Signed)
Addended by: Ofilia Neas on: 07/06/2017 09:03 AM   Modules accepted: Orders

## 2017-07-07 LAB — BASIC METABOLIC PANEL
BUN/Creatinine Ratio: 12 (ref 9–23)
BUN: 10 mg/dL (ref 6–24)
CALCIUM: 9.6 mg/dL (ref 8.7–10.2)
CHLORIDE: 104 mmol/L (ref 96–106)
CO2: 24 mmol/L (ref 20–29)
Creatinine, Ser: 0.81 mg/dL (ref 0.57–1.00)
GFR calc Af Amer: 103 mL/min/{1.73_m2} (ref >59)
GFR calc non Af Amer: 89 mL/min/{1.73_m2} (ref >59)
GLUCOSE: 84 mg/dL (ref 65–99)
POTASSIUM: 4.6 mmol/L (ref 3.5–5.2)
SODIUM: 143 mmol/L (ref 134–144)

## 2017-07-07 LAB — TSH: TSH: 2.55 u[IU]/mL (ref 0.450–4.500)

## 2017-07-07 LAB — VITAMIN D 25 HYDROXY (VIT D DEFICIENCY, FRACTURES): VIT D 25 HYDROXY: 48.5 ng/mL (ref 30.0–100.0)

## 2017-07-07 LAB — IRON,TIBC AND FERRITIN PANEL
Ferritin: 85 ng/mL (ref 15–150)
IRON SATURATION: 28 % (ref 15–55)
IRON: 75 ug/dL (ref 27–159)
Total Iron Binding Capacity: 265 ug/dL (ref 250–450)
UIBC: 190 ug/dL (ref 131–425)

## 2017-07-07 LAB — LIPID PANEL
CHOL/HDL RATIO: 3.4 ratio (ref 0.0–4.4)
Cholesterol, Total: 169 mg/dL (ref 100–199)
HDL: 50 mg/dL (ref >39)
LDL Calculated: 102 mg/dL — ABNORMAL HIGH (ref 0–99)
TRIGLYCERIDES: 87 mg/dL (ref 0–149)
VLDL CHOLESTEROL CAL: 17 mg/dL (ref 5–40)

## 2017-07-07 LAB — HEMOGLOBIN A1C
ESTIMATED AVERAGE GLUCOSE: 111 mg/dL
HEMOGLOBIN A1C: 5.5 % (ref 4.8–5.6)

## 2017-07-07 LAB — HEPATIC FUNCTION PANEL
ALBUMIN: 4.2 g/dL (ref 3.5–5.5)
ALT: 15 IU/L (ref 0–32)
AST: 22 IU/L (ref 0–40)
Alkaline Phosphatase: 115 IU/L (ref 39–117)
BILIRUBIN TOTAL: 0.3 mg/dL (ref 0.0–1.2)
Bilirubin, Direct: 0.08 mg/dL (ref 0.00–0.40)
TOTAL PROTEIN: 7.1 g/dL (ref 6.0–8.5)

## 2017-07-07 LAB — CBC
HEMOGLOBIN: 12.8 g/dL (ref 11.1–15.9)
Hematocrit: 41.3 % (ref 34.0–46.6)
MCH: 28.3 pg (ref 26.6–33.0)
MCHC: 31 g/dL — ABNORMAL LOW (ref 31.5–35.7)
MCV: 91 fL (ref 79–97)
PLATELETS: 272 10*3/uL (ref 150–379)
RBC: 4.53 x10E6/uL (ref 3.77–5.28)
RDW: 14.9 % (ref 12.3–15.4)
WBC: 6.9 10*3/uL (ref 3.4–10.8)

## 2017-07-07 LAB — HIV ANTIBODY (ROUTINE TESTING W REFLEX): HIV SCREEN 4TH GENERATION: NONREACTIVE

## 2017-07-08 LAB — GC/CHLAMYDIA PROBE AMP
Chlamydia trachomatis, NAA: NEGATIVE
Neisseria gonorrhoeae by PCR: NEGATIVE

## 2017-08-15 DIAGNOSIS — H5213 Myopia, bilateral: Secondary | ICD-10-CM | POA: Diagnosis not present

## 2017-08-26 ENCOUNTER — Ambulatory Visit: Payer: Self-pay

## 2017-08-26 NOTE — Telephone Encounter (Signed)
Patient called in with c/o "arm injury." She says "yesterday evening I was getting up into my husband's truck grabbing on the handle with my left arm to help pull me up, my foot slipped and I feel down pulling my arm. My arm was hurting at a 6-7 and I took Tylenol. Today, it is swollen from my shoulder to my elbow and my wrist is starting to swell. It's hurting today at a 6-7. I can move my arm about shoulder height, but if I try to raise it higher, it is excruciating pain and I get nauseated." I advised not to raise her arm, to protect it until she can be evaluated. I asked about other symptoms, numbness to her hand, tingling to her hand/fingers, she denies. According to protocol, see PCP within 24 hours, appointment scheduled for tomorrow at 1720 with Deliah BostonMichael Clark, PA-C, care advice given, patient verbalized understanding.   Reason for Disposition . Can't move injured arm normally (bend or straighten completely)  Answer Assessment - Initial Assessment Questions 1. MECHANISM: "How did the injury happen?"     Trying to get in a truck and foot slipped and pulled on arm 2. ONSET: "When did the injury happen?" (Minutes or hours ago)      Last night about 1800 3. LOCATION: "Where is the injury located?"     Left arm 4. APPEARANCE of INJURY: "What does the injury look like?"     Swollen from shoulder to elbow and now swelling toward wrist area 5. SEVERITY: "Can you use the arm normally?"      Yes, mid motion almost shoulder height 6. SWELLING or BRUISING: "is there any swelling or bruising?" If so, ask: "How large is it? (e.g., inches, centimeters)      Swelling 7. PAIN: "Is there pain?" If so, ask: "How bad is the pain?"    (Scale 1-10; or mild, moderate, severe)     6-7 pain 8. TETANUS: For any breaks in the skin, ask: "When was the last tetanus booster?"     N/A 9. OTHER SYMPTOMS: "Do you have any other symptoms?"  (e.g., numbness in hand)     No 10. PREGNANCY: "Is there any chance you are  pregnant?" "When was your last menstrual period?"     No  Protocols used: ARM INJURY-A-AH

## 2017-08-27 ENCOUNTER — Other Ambulatory Visit: Payer: Self-pay

## 2017-08-27 ENCOUNTER — Encounter: Payer: Self-pay | Admitting: Physician Assistant

## 2017-08-27 ENCOUNTER — Ambulatory Visit: Payer: 59 | Admitting: Physician Assistant

## 2017-08-27 VITALS — BP 128/96 | HR 69 | Temp 98.4°F | Resp 16 | Ht 64.5 in | Wt 263.2 lb

## 2017-08-27 DIAGNOSIS — J3089 Other allergic rhinitis: Secondary | ICD-10-CM | POA: Diagnosis not present

## 2017-08-27 DIAGNOSIS — M25512 Pain in left shoulder: Secondary | ICD-10-CM | POA: Diagnosis not present

## 2017-08-27 MED ORDER — FLUTICASONE PROPIONATE 50 MCG/ACT NA SUSP: 2.0000 | Freq: Every day | NASAL | 6 refills | Status: DC

## 2017-08-27 NOTE — Patient Instructions (Addendum)
Continue tylenol and muscle rub and wear the shoulder immobilzer for about 2 weeks to ensure that your shoulder is protected from movements that may worsen the injury.  Come back if you are still having symptoms at that time.   Continue benadryl. CHeck out the netti pot.  THese are sold in any pharmacy.  You can watch some youtube videos before trying.    IF you received an x-ray today, you will [facility]. Please contact [facility] at [phone] with questions or concerns regarding your invoice.   IF you received labwork today, you will receive an invoice from [facility]. Please contact [facility] at [phone] with questions or concerns regarding your invoice.   Our billing staff will not be able to assist you with questions regarding bills from these companies.  You will be contacted with the lab results as soon as they are available. The fastest way to get your results is to activate your My Chart account. Instructions are located on the last page of this paperwork. If you have not heard from us regarding the results in 2 weeks, please contact this office.

## 2017-08-27 NOTE — Progress Notes (Signed)
08/31/2017 10:10 AM   DOB: [birth year] / MRN: [id number]  SUBJECTIVE:  Monica Hernandez is a 42 y.o. female presenting for left arm pain that is worse with movement. Symptoms present for about 1 month.  The problem is worsening. She has tried Tylenol with some relief in pain.  The pain started after slipping down from her husband's truck while she was holding onto the cab handle.  She did not let go of the handle during the fall.  Complains of sneezing, nasal congestion, itchy eyes.  This all started after being exposed to fungus in her place of residence where she rents.  She is made the landlord aware however there is been no efforts made to clean up the mold.  Tells me her husband and son are both also suffering from allergic symptoms.  She is tried antihistamines with some relief.  She is allergic to contrave [naltrexone-bupropion hcl er]; erythromycin; shellfish allergy; zithromax [azithromycin]; belviq [lorcaserin hcl]; qsymia [phentermine-topiramate]; saxenda [liraglutide -weight management]; allegra-d [fexofenadine-pseudoephed er]; naltrexone; fish allergy; and sulfa antibiotics.   She  has a past medical history of Allergy, Anemia, Asthma, GERD (gastroesophageal reflux disease), History of kidney stones, Hypertension, Kidney stone, Mitral valve prolapse, PONV (postoperative nausea and vomiting), Pre-diabetes, and Ulcer.    She  reports that she quit smoking about 9 years ago. Her smoking use included cigarettes. She has never used smokeless tobacco. She reports that she does not drink alcohol or use drugs. She  reports that she currently engages in sexual activity and has had partners who are Female. She reports using the following method of birth control/protection: Surgical. The patient  has a past surgical history that includes Tubal ligation; Adenoidectomy, tonsillectomy and myringotomy with tube placement; Abdominal hysterectomy; Leg Surgery (Left); Cesarean section; Fracture surgery;  and Gastric Roux-En-Y (N/A, 03/31/2017).  Her family history includes Asthma in her other; Bone cancer in her father; Brain cancer in her father; Cancer in her other; Diabetes in her maternal grandmother; Heart disease in her maternal grandmother; Hypertension in her mother and sister; Lung cancer in her father; Stroke in her father and mother; Thyroid cancer in her mother.  Review of Systems  Constitutional: Negative for chills, diaphoresis and fever.  Eyes: Negative.   Respiratory: Negative for cough, hemoptysis, sputum production, shortness of breath and wheezing.   Cardiovascular: Negative for chest pain, orthopnea and leg swelling.  Gastrointestinal: Negative for nausea.  Musculoskeletal: Positive for falls, joint pain and myalgias. Negative for back pain and neck pain.  Skin: Negative for rash.  Neurological: Negative for dizziness, sensory change, speech change, focal weakness and headaches.    The problem list and medications were reviewed and updated by myself where necessary and exist elsewhere in the encounter.   OBJECTIVE:  BP (!) 128/96 (BP Location: Right Arm, Patient Position: Sitting, Cuff Size: Large)   Pulse 69   Temp 98.4 F (36.9 C) (Oral)   Resp 16   Ht 5' 4.5" (1.638 m)   Wt 263 lb 3.2 oz (119.4 kg)   SpO2 99%   BMI 44.48 kg/m   Wt Readings from Last 3 Encounters:  08/27/17 263 lb 3.2 oz (119.4 kg)  07/06/17 265 lb 12.8 oz (120.6 kg)  05/27/17 277 lb 6.4 oz (125.8 kg)   Temp Readings from Last 3 Encounters:  08/27/17 98.4 F (36.9 C) (Oral)  07/06/17 98.5 F (36.9 C) (Oral)  05/08/17 98 F (36.7 C) (Oral)   BP Readings from Last 3 Encounters:  08/27/17 Marland Kitchen(!)  128/96  07/06/17 108/81  05/08/17 (!) 144/84   Pulse Readings from Last 3 Encounters:  08/27/17 69  07/06/17 66  05/08/17 73    Physical Exam  Constitutional: She is oriented to person, place, and time. She appears well-nourished. No distress.  HENT:  Right Ear: Hearing, tympanic membrane,  external ear and ear canal normal.  Left Ear: Hearing, tympanic membrane, external ear and ear canal normal.  Nose: Nose normal. Right sinus exhibits no maxillary sinus tenderness and no frontal sinus tenderness. Left sinus exhibits no maxillary sinus tenderness and no frontal sinus tenderness.  Mouth/Throat: Uvula is midline, oropharynx is clear and moist and mucous membranes are normal. Mucous membranes are not dry. No oropharyngeal exudate, posterior oropharyngeal edema or tonsillar abscesses.  Eyes: Pupils are equal, round, and reactive to light. EOM are normal.  Cardiovascular: Normal rate.  Pulmonary/Chest: Effort normal.  Abdominal: She exhibits no distension.  Musculoskeletal: She exhibits tenderness (TTP about the left supraspinatus, proximal insertion point of the biceps.  Internal and external rotation intact to challenge.  AB and adduction intact to challenge.).  Lymphadenopathy:       Head (right side): No submandibular and no tonsillar adenopathy present.       Head (left side): No submandibular and no tonsillar adenopathy present.    She has no cervical adenopathy.  Neurological: She is alert and oriented to person, place, and time. No cranial nerve deficit. Gait normal.  Skin: Skin is dry. She is not diaphoretic.  Psychiatric: She has a normal mood and affect.  Vitals reviewed.   Lab Results  Component Value Date   HGBA1C 5.5 07/06/2017    Lab Results  Component Value Date   WBC 6.9 07/06/2017   HGB 12.8 07/06/2017   HCT 41.3 07/06/2017   MCV 91 07/06/2017   PLT 272 07/06/2017    Lab Results  Component Value Date   CREATININE 0.81 07/06/2017   BUN 10 07/06/2017   NA 143 07/06/2017   K 4.6 07/06/2017   CL 104 07/06/2017   CO2 24 07/06/2017    Lab Results  Component Value Date   ALT 15 07/06/2017   AST 22 07/06/2017   ALKPHOS 115 07/06/2017   BILITOT 0.3 07/06/2017    Lab Results  Component Value Date   TSH 2.550 07/06/2017    Lab Results    Component Value Date   CHOL 169 07/06/2017   HDL 50 07/06/2017   LDLCALC 102 (H) 07/06/2017   TRIG 87 07/06/2017   CHOLHDL 3.4 07/06/2017     ASSESSMENT AND PLAN:  Monica Hernandez was seen today for arm.  Diagnoses and all orders for this visit:  Acute pain of left shoulder: Possibly rotator cuff strain, tendinitis or small tear.  Placing her in an arm sling and advising that she wear this at least 2 weeks.  She does not like to take NSAIDs and wants to continue with Tylenol.  She will come back in about 3 weeks if she is not significantly better.  Allergic rhinitis caused by mold: Advised that she start Flonase, potentially contact a lawyer regarding poor living conditions or move out of the house into her new house.  She prefers the latter option and is working to find a new place to live.  Advised that if there is anything I can get help please contact me. -     fluticasone (FLONASE) 50 MCG/ACT nasal spray; Place 2 sprays into both nostrils daily.    The patient is advised  to call or return to clinic if she does not see an improvement in symptoms, or to seek the care of the closest emergency department if she worsens with the above plan.   Deliah Boston, MHS, PA-C Primary Care at [facility] 08/31/2017 10:10 AM

## 2017-09-11 ENCOUNTER — Ambulatory Visit: Payer: 59 | Admitting: Physician Assistant

## 2017-09-19 ENCOUNTER — Telehealth: Payer: 59 | Admitting: Family

## 2017-09-19 DIAGNOSIS — J329 Chronic sinusitis, unspecified: Secondary | ICD-10-CM | POA: Diagnosis not present

## 2017-09-19 DIAGNOSIS — B9689 Other specified bacterial agents as the cause of diseases classified elsewhere: Secondary | ICD-10-CM

## 2017-09-19 MED ORDER — AMOXICILLIN-POT CLAVULANATE 875-125 MG PO TABS: 1.0000 | ORAL_TABLET | Freq: Two times a day (BID) | ORAL | 0 refills | Status: AC

## 2017-09-19 NOTE — Progress Notes (Signed)
Thank you for the details you included in the comment boxes. Those details are very helpful in determining the best course of treatment for you and help us to provide the best care. From what you are telling me about the green discharge, that is also more consistent with pinkeye, yet from your phone call, it appears that this is drainage from sinus/allergies or other causes, not an infection. See plan below for sinuses.   We are sorry that you are not feeling well.  Here is how we plan to help!  Based on what you have shared with me it looks like you have sinusitis.  Sinusitis is inflammation and infection in the sinus cavities of the head.  Based on your presentation I believe you most likely have Acute Bacterial Sinusitis.  This is an infection caused by bacteria and is treated with antibiotics. I have prescribed Augmentin 875mg /125mg  one tablet twice daily with food, for 7 days. You may use an oral decongestant such as Mucinex D or if you have glaucoma or high blood pressure use plain Mucinex. Saline nasal spray help and can safely be used as often as needed for congestion.  If you develop worsening sinus pain, fever or notice severe headache and vision changes, or if symptoms are not better after completion of antibiotic, please schedule an appointment with a health care provider.    Sinus infections are not as easily transmitted as other respiratory infection, however we still recommend that you avoid close contact with loved ones, especially the very young and elderly.  Remember to wash your hands thoroughly throughout the day as this is the number one way to prevent the spread of infection!  Home Care:  Only take medications as instructed by your medical team.  Complete the entire course of an antibiotic.  Do not take these medications with alcohol.  A steam or ultrasonic humidifier can help congestion.  You can place a towel over your head and breathe in the steam from hot water coming from a  faucet.  Avoid close contacts especially the very young and the elderly.  Cover your mouth when you cough or sneeze.  Always remember to wash your hands.  Get Help Right Away If:  You develop worsening fever or sinus pain.  You develop a severe head ache or visual changes.  Your symptoms persist after you have completed your treatment plan.  Make sure you  Understand these instructions.  Will watch your condition.  Will get help right away if you are not doing well or get worse.  Your e-visit answers were reviewed by a board certified advanced clinical practitioner to complete your personal care plan.  Depending on the condition, your plan could have included both over the counter or prescription medications.  If there is a problem please reply  once you have received a response from your provider.  Your safety is important to us.  If you have drug allergies check your prescription carefully.    You can use MyChart to ask questions about today's visit, request a non-urgent call back, or ask for a work or school excuse for 24 hours related to this e-Visit. If it has been greater than 24 hours you will need to follow up with your provider, or enter a new e-Visit to address those concerns.  You will get an e-mail in the next two days asking about your experience.  I hope that your e-visit has been valuable and will speed your recovery. Thank you for  using e-visits.    

## 2017-09-30 ENCOUNTER — Ambulatory Visit: Payer: 59 | Admitting: Registered"

## 2017-10-05 MED FILL — LANSOPRAZOLE DR 30 MG CAP: 30 | 90 days supply | Qty: 90 | Fill #1

## 2017-10-28 ENCOUNTER — Emergency Department (HOSPITAL_COMMUNITY): Payer: 59

## 2017-10-28 ENCOUNTER — Encounter (HOSPITAL_COMMUNITY): Payer: Self-pay | Admitting: Emergency Medicine

## 2017-10-28 ENCOUNTER — Other Ambulatory Visit: Payer: Self-pay

## 2017-10-28 ENCOUNTER — Emergency Department (HOSPITAL_COMMUNITY)
Admission: EM | Admit: 2017-10-28 | Discharge: 2017-10-29 | Disposition: A | Discharge status: Home / Self Care | Payer: 59 | Attending: Emergency Medicine | Admitting: Emergency Medicine

## 2017-10-28 DIAGNOSIS — I10 Essential (primary) hypertension: Secondary | ICD-10-CM | POA: Insufficient documentation

## 2017-10-28 DIAGNOSIS — J45909 Unspecified asthma, uncomplicated: Secondary | ICD-10-CM | POA: Insufficient documentation

## 2017-10-28 DIAGNOSIS — Z79899 Other long term (current) drug therapy: Secondary | ICD-10-CM | POA: Insufficient documentation

## 2017-10-28 DIAGNOSIS — R42 Dizziness and giddiness: Secondary | ICD-10-CM | POA: Insufficient documentation

## 2017-10-28 DIAGNOSIS — Z87891 Personal history of nicotine dependence: Secondary | ICD-10-CM | POA: Diagnosis not present

## 2017-10-28 DIAGNOSIS — R002 Palpitations: Secondary | ICD-10-CM | POA: Diagnosis not present

## 2017-10-28 DIAGNOSIS — H538 Other visual disturbances: Secondary | ICD-10-CM | POA: Diagnosis not present

## 2017-10-28 DIAGNOSIS — R112 Nausea with vomiting, unspecified: Secondary | ICD-10-CM | POA: Insufficient documentation

## 2017-10-28 DIAGNOSIS — R51 Headache: Secondary | ICD-10-CM | POA: Diagnosis not present

## 2017-10-28 DIAGNOSIS — R079 Chest pain, unspecified: Secondary | ICD-10-CM | POA: Diagnosis not present

## 2017-10-28 LAB — HEPATIC FUNCTION PANEL
ALT: 21 U/L (ref 0–44)
AST: 21 U/L (ref 15–41)
Albumin: 4 g/dL (ref 3.5–5.0)
Alkaline Phosphatase: 100 U/L (ref 38–126)
Total Bilirubin: 0.4 mg/dL (ref 0.3–1.2)
Total Protein: 7.5 g/dL (ref 6.5–8.1)

## 2017-10-28 LAB — CBC WITH DIFFERENTIAL/PLATELET
BASOS ABS: 0 10*3/uL (ref 0.0–0.1)
Basophils Relative: 0 %
Eosinophils Absolute: 0.3 10*3/uL (ref 0.0–0.7)
Eosinophils Relative: 3 %
HEMATOCRIT: 39.3 % (ref 36.0–46.0)
Hemoglobin: 13 g/dL (ref 12.0–15.0)
LYMPHS PCT: 31 %
Lymphs Abs: 3.1 10*3/uL (ref 0.7–4.0)
MCH: 29.9 pg (ref 26.0–34.0)
MCHC: 33.1 g/dL (ref 30.0–36.0)
MCV: 90.3 fL (ref 78.0–100.0)
MONO ABS: 0.7 10*3/uL (ref 0.1–1.0)
MONOS PCT: 7 %
NEUTROS ABS: 6 10*3/uL (ref 1.7–7.7)
Neutrophils Relative %: 59 %
Platelets: 249 10*3/uL (ref 150–400)
RBC: 4.35 MIL/uL (ref 3.87–5.11)
RDW: 13.8 % (ref 11.5–15.5)
WBC: 10 10*3/uL (ref 4.0–10.5)

## 2017-10-28 LAB — BASIC METABOLIC PANEL
ANION GAP: 8 (ref 5–15)
BUN: 13 mg/dL (ref 6–20)
CHLORIDE: 105 mmol/L (ref 98–111)
CO2: 28 mmol/L (ref 22–32)
Calcium: 9.1 mg/dL (ref 8.9–10.3)
Creatinine, Ser: 0.89 mg/dL (ref 0.44–1.00)
GFR calc Af Amer: >60 mL/min (ref >60)
GFR calc non Af Amer: >60 mL/min (ref >60)
GLUCOSE: 91 mg/dL (ref 70–99)
POTASSIUM: 3.8 mmol/L (ref 3.5–5.1)
Sodium: 141 mmol/L (ref 135–145)

## 2017-10-28 LAB — URINALYSIS, ROUTINE W REFLEX MICROSCOPIC
Bilirubin Urine: NEGATIVE
GLUCOSE, UA: NEGATIVE mg/dL
HGB URINE DIPSTICK: NEGATIVE
Ketones, ur: NEGATIVE mg/dL
LEUKOCYTES UA: NEGATIVE
Nitrite: NEGATIVE
PROTEIN: NEGATIVE mg/dL
Specific Gravity, Urine: 1.003 — ABNORMAL LOW (ref 1.005–1.030)
pH: 6 (ref 5.0–8.0)

## 2017-10-28 LAB — RAPID URINE DRUG SCREEN, HOSP PERFORMED
Amphetamines: NOT DETECTED
BARBITURATES: NOT DETECTED
Benzodiazepines: NOT DETECTED
COCAINE: NOT DETECTED
Opiates: NOT DETECTED
TETRAHYDROCANNABINOL: NOT DETECTED

## 2017-10-28 LAB — LIPASE, BLOOD: Lipase: 87 U/L — ABNORMAL HIGH (ref 11–51)

## 2017-10-28 LAB — TROPONIN I: Troponin I: <0.03 ng/mL (ref <0.03)

## 2017-10-28 NOTE — ED Notes (Signed)
Ambulated around unit. Maintained 98%. Starting HR was 70BPM. Final/highest HR was 93BPM. EDP notified.

## 2017-10-28 NOTE — ED Triage Notes (Signed)
Pt c/o flutter in chest, dizziness and nausea today. States iwatch told her that her hr ranged from 50 to 140. Non diaphoretic.

## 2017-10-28 NOTE — ED Provider Notes (Signed)
[facility] EMERGENCY DEPARTMENT Provider Note   CSN: [id number] Arrival date & time: 10/28/17  2135     History   Chief Complaint Chief Complaint  Patient presents with  . Dizziness    HPI Monica Hernandez is a 42 y.o. female.  HPI  Pt was seen at 2145. Per pt, c/o gradual onset and complete improvement of one episode of multiple symptoms that occurred this evening. Pt states that she and her family ate pizza and "didn't really feel well afterwards." Pt states she felt "flushed," followed by several episodes of N/V, lightheadedness, palpitations, left sinus "pain," and left eye "blurred vision." Pt states her iwatch told her that her HR increased to "140's" during this episode, then "dropped to the 50's." Pt states she feels improved by arrival to the ED. Pt denies CP, no SOB/cough, no abd pain, no diarrhea, no black or blood in emesis, no neck pain, no headache, no fever, no rash, no eye pain, no flashers/floaters, no curtain over vision, no visual field cut, no eye injury, no focal motor weakness, no tingling/numbness in extremities, no slurred speech, no ataxia, no facial droop, no rash, no fever.   Past Medical History:  Diagnosis Date  . Allergy   . Anemia   . Asthma   . GERD (gastroesophageal reflux disease)   . History of kidney stones   . Hypertension   . Kidney stone   . Mitral valve prolapse    was told as a kid that she had this but during ECHO for eval, ECHO did not indicate this , see 08-25-16 epic result   . PONV (postoperative nausea and vomiting)    epidural with c section caused N/V   . Pre-diabetes   . Ulcer     Patient Active Problem List   Diagnosis Date Noted  . Morbid obesity (HCC) 03/31/2017    Past Surgical History:  Procedure Laterality Date  . ABDOMINAL HYSTERECTOMY    . ADENOIDECTOMY, TONSILLECTOMY AND MYRINGOTOMY WITH TUBE PLACEMENT    . CESAREAN SECTION    . FRACTURE SURGERY    . GASTRIC ROUX-EN-Y N/A 03/31/2017   Procedure: LAPAROSCOPIC  ROUX-EN-Y GASTRIC BYPASS WITH UPPER ENDOSCOPY;  Surgeon: Sheliah Hatch, De Blanch, MD;  Location: [facility]S;  Service: General;  Laterality: N/A;  . LEG SURGERY Left   . TUBAL LIGATION       OB History   None      Home Medications    Prior to Admission medications   Medication Sig Start Date End Date Taking? Authorizing Provider  acetaminophen (TYLENOL) 500 MG tablet Take 1,000 mg by mouth every 6 (six) hours as needed for mild pain or moderate pain.   Yes [provider]  Calcium-Phosphorus-Vitamin D (CALCIUM/VITAMIN D3/ADULT GUMMY) 250-100-500 MG-MG-UNIT CHEW Chew 2 tablets by mouth 3 (three) times daily.   Yes [provider]  Cholecalciferol 2000 units CAPS Take 2,000 Units by mouth daily.   Yes [provider]  dicyclomine (BENTYL) 10 MG capsule Take 1 capsule (10 mg total) by mouth 3 (three) times daily before meals. Patient taking differently: Take 10 mg by mouth every morning.  07/06/17  Yes Ofilia Neas, PA-C  fluticasone (FLONASE) 50 MCG/ACT nasal spray Place 2 sprays into both nostrils daily. Patient taking differently: Place 2 sprays into both nostrils daily as needed for allergies.  08/27/17  Yes Ofilia Neas, PA-C  lansoprazole (PREVACID) 30 MG capsule Take 1 capsule (30 mg total) by mouth daily. 07/06/17  Yes Clark,  Marolyn Hammock, PA-C  Multiple Vitamins-Minerals (BARIATRIC MULTIVITAMINS/IRON PO) Take 2 tablets by mouth 2 (two) times daily. Chewable tablets   Yes [provider]    Family History Family History  Problem Relation Age of Onset  . Hypertension Mother   . Stroke Mother   . Thyroid cancer Mother   . Stroke Father   . Lung cancer Father   . Brain cancer Father   . Bone cancer Father   . Hypertension Sister   . Heart disease Maternal Grandmother   . Diabetes Maternal Grandmother   . Asthma Other   . Cancer Other   . Stomach cancer Neg Hx   . Colon cancer Neg Hx     Social History Social History   Tobacco Use  .  Smoking status: Former Smoker    Types: Cigarettes    Last attempt to quit: 2010    Years since quitting: 9.6  . Smokeless tobacco: Never Used  . Tobacco comment: smoked since age 31 ; quit in 2010   Substance Use Topics  . Alcohol use: No  . Drug use: No     Allergies   Contrave [naltrexone-bupropion hcl er]; Erythromycin; Shellfish allergy; Zithromax [azithromycin]; Belviq [lorcaserin hcl]; Qsymia [phentermine-topiramate]; Saxenda [liraglutide -weight management]; Allegra-d [fexofenadine-pseudoephed er]; Naltrexone; Fish allergy; and Sulfa antibiotics   Review of Systems Review of Systems ROS: Statement: All systems negative except as marked or noted in the HPI; Constitutional: Negative for fever and chills. ; ; Eyes: Negative for eye pain, redness and discharge. +transient blurry vision.; ; ENMT: Negative for ear pain, hoarseness, nasal congestion, sore throat. +left sinus pressure. ; ; Cardiovascular: +palpitations. Negative for chest pain, diaphoresis, dyspnea and peripheral edema. ; ; Respiratory: Negative for cough, wheezing and stridor. ; ; Gastrointestinal: +N/V. Negative for diarrhea, abdominal pain, blood in stool, hematemesis, jaundice and rectal bleeding. . ; ; Genitourinary: Negative for dysuria, flank pain and hematuria. ; ; Musculoskeletal: Negative for back pain and neck pain. Negative for swelling and trauma.; ; Skin: Negative for pruritus, rash, abrasions, blisters, bruising and skin lesion.; ; Neuro: +lightheadedness. Negative for headache and neck stiffness. Negative for weakness, altered level of consciousness, altered mental status, extremity weakness, paresthesias, involuntary movement, seizure and syncope.       Physical Exam Updated Vital Signs BP 120/79   Pulse 79   Temp 98.1 F (36.7 C) (Oral)   Resp 19   SpO2 97%    Patient Vitals for the past 24 hrs:  BP Temp Temp src Pulse Resp SpO2  10/28/17 2330 120/79 - - 79 19 97 %  10/28/17 2315 - - - 69 (!) 9 95  %  10/28/17 2300 (!) 141/86 - - 98 10 97 %  10/28/17 2245 - - - 77 - 99 %  10/28/17 2230 115/74 - - 85 11 94 %  10/28/17 2215 - - - 73 17 100 %  10/28/17 2200 130/88 - - 85 17 97 %  10/28/17 2145 (!) 142/85 - - 87 16 97 %  10/28/17 2144 (!) 142/85 98.1 F (36.7 C) Oral 87 18 97 %   22:52 Orthostatic Vital Signs TC  Orthostatic Lying   BP- Lying: 124/85   Pulse- Lying: 80       Orthostatic Sitting  BP- Sitting: 139/90   Pulse- Sitting: 82       Orthostatic Standing at 0 minutes  BP- Standing at 0 minutes: 143/95Abnormal    Pulse- Standing at 0 minutes: 94  Orthostatic Standing at 3 minutes  BP- Standing at 3 minutes: 149/106Abnormal    Pulse- Standing at 3 minutes: 94      Physical Exam 2150: Physical examination:  Nursing notes reviewed; Vital signs and O2 SAT reviewed;  Constitutional: Well developed, Well nourished, Well hydrated, In no acute distress; Head:  Normocephalic, atraumatic. No temporal artery tenderness, no mastoid tenderness.;  Eyes: EOMI, PERRL, No scleral icterus; Eye Exam: Right pupil: Size: 3 mm; Findings: Normal, Briskly reactive; Left pupil: Size: 3 mm; Findings: Normal, Briskly reactive; Extraocular movement: Bilateral normal, No nystagmus. ; Eyelid: Bilateral normal, No edema.  No ptosis. No obvious FB identified. ; Conjunctiva and sclera: Bilateral normal, No conjunctival injection, no chemosis, no discharge.  No obvious hyphema or hypopion. ; ; Retina: Non-dilated fundoscopic exam without obvious papilledema or hemorrhage. ;;; ENMT: Mouth and pharynx normal, Mucous membranes moist. Clear fluid levels behind TM's bilat. +edemetous nasal turbinates bilat with clear rhinorrhea.; Neck: Supple, Full range of motion, No lymphadenopathy; Cardiovascular: Regular rate and rhythm, No gallop; Respiratory: Breath sounds clear & equal bilaterally, No wheezes.  Speaking full sentences with ease, Normal respiratory effort/excursion; Chest: Nontender, Movement normal;  Abdomen: Soft, Nontender, Nondistended, Normal bowel sounds; Genitourinary: No CVA tenderness; Spine:  No midline CS, TS, LS tenderness.;; Extremities: Peripheral pulses normal, No tenderness, No edema, No calf edema or asymmetry.; Neuro: AA&Ox3, Major CN grossly intact. No facial droop. Speech clear. No gross focal motor or sensory deficits in extremities. Climbs on and off stretcher easily by herself. Gait steady..; Skin: Color normal, Warm, Dry. No rash.    ED Treatments / Results  Labs (all labs ordered are listed, but only abnormal results are displayed)   EKG EKG Interpretation  Date/Time:  Wednesday October 28 2017 21:45:38 EDT Ventricular Rate:  88 PR Interval:    QRS Duration: 105 QT Interval:  393 QTC Calculation: 476 R Axis:   28 Text Interpretation:  Sinus rhythm Low voltage, precordial leads Borderline T abnormalities, anterior leads Artifact When compared with ECG of 04/30/2016 No significant change was found Confirmed by Samuel Jester [phone]) on 10/28/2017 10:16:25 PM   Radiology   Procedures Procedures (including critical care time)  Medications Ordered in ED Medications - No data to display   Initial Impression / Assessment and Plan / ED Course  I have reviewed the triage vital signs and the nursing notes.  Pertinent labs & imaging results that were available during my care of the patient were reviewed by me and considered in my medical decision making (see chart for details).  MDM Reviewed: previous chart, nursing note and vitals Reviewed previous: labs and ECG Interpretation: labs, ECG, x-ray and CT scan   Results for orders placed or performed during the hospital encounter of 10/28/17  Basic metabolic panel  Result Value Ref Range   Sodium 141 135 - 145 mmol/L   Potassium 3.8 3.5 - 5.1 mmol/L   Chloride 105 98 - 111 mmol/L   CO2 28 22 - 32 mmol/L   Glucose, Bld 91 70 - 99 mg/dL   BUN 13 6 - 20 mg/dL   Creatinine, Ser 1.91 0.44 - 1.00 mg/dL    Calcium 9.1 8.9 - 47.8 mg/dL   GFR calc non Af Amer >60 >60 mL/min   GFR calc Af Amer >60 >60 mL/min   Anion gap 8 5 - 15  CBC with Differential  Result Value Ref Range   WBC 10.0 4.0 - 10.5 K/uL   RBC 4.35 3.87 - 5.11 MIL/uL  Hemoglobin 13.0 12.0 - 15.0 g/dL   HCT 21.339.3 08.636.0 - 57.846.0 %   MCV 90.3 78.0 - 100.0 fL   MCH 29.9 26.0 - 34.0 pg   MCHC 33.1 30.0 - 36.0 g/dL   RDW 46.913.8 62.911.5 - 52.815.5 %   Platelets 249 150 - 400 K/uL   Neutrophils Relative % 59 %   Neutro Abs 6.0 1.7 - 7.7 K/uL   Lymphocytes Relative 31 %   Lymphs Abs 3.1 0.7 - 4.0 K/uL   Monocytes Relative 7 %   Monocytes Absolute 0.7 0.1 - 1.0 K/uL   Eosinophils Relative 3 %   Eosinophils Absolute 0.3 0.0 - 0.7 K/uL   Basophils Relative 0 %   Basophils Absolute 0.0 0.0 - 0.1 K/uL  Troponin I  Result Value Ref Range   Troponin I <0.03 <0.03 ng/mL  Urine rapid drug screen (hosp performed)  Result Value Ref Range   Opiates NONE DETECTED NONE DETECTED   Cocaine NONE DETECTED NONE DETECTED   Benzodiazepines NONE DETECTED NONE DETECTED   Amphetamines NONE DETECTED NONE DETECTED   Tetrahydrocannabinol NONE DETECTED NONE DETECTED   Barbiturates NONE DETECTED NONE DETECTED  Urinalysis, Routine w reflex microscopic  Result Value Ref Range   Color, Urine STRAW (A) YELLOW   APPearance CLEAR CLEAR   Specific Gravity, Urine 1.003 (L) 1.005 - 1.030   pH 6.0 5.0 - 8.0   Glucose, UA NEGATIVE NEGATIVE mg/dL   Hgb urine dipstick NEGATIVE NEGATIVE   Bilirubin Urine NEGATIVE NEGATIVE   Ketones, ur NEGATIVE NEGATIVE mg/dL   Protein, ur NEGATIVE NEGATIVE mg/dL   Nitrite NEGATIVE NEGATIVE   Leukocytes, UA NEGATIVE NEGATIVE  Hepatic function panel  Result Value Ref Range   Total Protein 7.5 6.5 - 8.1 g/dL   Albumin 4.0 3.5 - 5.0 g/dL   AST 21 15 - 41 U/L   ALT 21 0 - 44 U/L   Alkaline Phosphatase 100 38 - 126 U/L   Total Bilirubin 0.4 0.3 - 1.2 mg/dL   Bilirubin, Direct <4.1<0.1 0.0 - 0.2 mg/dL   Indirect Bilirubin NOT CALCULATED  0.3 - 0.9 mg/dL  Lipase, blood  Result Value Ref Range   Lipase 87 (H) 11 - 51 U/L   Dg Chest 2 View Result Date: 10/28/2017 CLINICAL DATA:  Chest pain. EXAM: CHEST - 2 VIEW COMPARISON:  Radiograph 08/05/2016 FINDINGS: The cardiomediastinal contours are normal. Heart size normal for AP technique. The lungs are clear. Pulmonary vasculature is normal. No consolidation, pleural effusion, or pneumothorax. No acute osseous abnormalities are seen. IMPRESSION: Negative radiographs of the chest. Electronically Signed   By: Rubye OaksMelanie  Ehinger M.D.   On: 10/28/2017 22:23   Ct Head Wo Contrast Result Date: 10/28/2017 CLINICAL DATA:  Headache, chronic, normal neuro exam. Dizziness and nausea today. EXAM: CT HEAD WITHOUT CONTRAST TECHNIQUE: Contiguous axial images were obtained from the base of the skull through the vertex without intravenous contrast. COMPARISON:  None. FINDINGS: Brain: No intracranial hemorrhage, mass effect, or midline shift. No hydrocephalus. The basilar cisterns are patent. No evidence of territorial infarct or acute ischemia. No extra-axial or intracranial fluid collection. Vascular: No hyperdense vessel or unexpected calcification. Skull: No fracture or focal lesion. Sinuses/Orbits: Paranasal sinuses and mastoid air cells are clear. The visualized orbits are unremarkable. Other: None. IMPRESSION: Negative head CT. Electronically Signed   By: Rubye OaksMelanie  Ehinger M.D.   On: 10/28/2017 22:30    2355:  Lipase mildly non-specifically elevated, abd remains benign on exam. Pt not orthostatic on VS.  Pt ambulated around the ED with HR 70's, Sats 98% R/A. Pt continues to "feel better" while in the ED and wants to go home now. No clear indication for admission at this time. Doubt CVA, carotid dissection, SAH. Doubt intra-ocular pathology. Doubt PE as cause for symptoms given low risk Wells. Tx symptomatically, f/u PMD. Dx and testing d/w pt and family.  Questions answered.  Verb understanding, agreeable to  d/c home with outpt f/u.     Final Clinical Impressions(s) / ED Diagnoses   Final diagnoses:  Palpitations  Lightheadedness  Nausea and vomiting in adult    ED Discharge Orders    None       Samuel JesterMcManus, Yahmir Sokolov, DO 10/31/17 1211

## 2017-10-29 MED ORDER — ONDANSETRON 4 MG PO TBDP: 4.0000 mg | ORAL_TABLET | Freq: Three times a day (TID) | ORAL | 0 refills | Status: DC | PRN

## 2017-10-29 NOTE — Discharge Instructions (Signed)
Avoid avoid caffinated products, such as teas, colas, coffee, chocolate. Avoid over the counter cold medicines, herbal or "natural vitamin" products, and illicit drugs because they can contain stimulants. Take the prescriptionsas directed.  Increase your fluid intake (ie:  Gatoraide) for the next few days.  Eat a bland diet and advance to your regular diet slowly as you can tolerate it.  Call your regular medical doctor tomorrow to schedule a follow up appointment in the next 2 days. Return to the Emergency Department immediately sooner if worsening.

## 2017-11-02 ENCOUNTER — Ambulatory Visit: Payer: 59 | Admitting: Registered"

## 2017-12-11 ENCOUNTER — Telehealth: Payer: 59 | Admitting: Family

## 2017-12-11 DIAGNOSIS — J329 Chronic sinusitis, unspecified: Secondary | ICD-10-CM

## 2017-12-11 DIAGNOSIS — B9689 Other specified bacterial agents as the cause of diseases classified elsewhere: Secondary | ICD-10-CM | POA: Diagnosis not present

## 2017-12-11 MED ORDER — AMOXICILLIN-POT CLAVULANATE 875-125 MG PO TABS: 1.0000 | ORAL_TABLET | Freq: Two times a day (BID) | ORAL | 0 refills | Status: AC

## 2017-12-11 MED FILL — AMOX-CLAV 875-125 MG TABLET: 875-125 | 7 days supply | Qty: 14 | Fill #0

## 2017-12-11 NOTE — Progress Notes (Signed)
Thank you for the details you included in the comment boxes. Those details are very helpful in determining the best course of treatment for you and help [place] to provide the best care. If the blurry vision worsens or does not improve in the next 24-48h, please be seen face-to-face.  We are sorry that you are not feeling well.  Here is how we plan to help!  Based on what you have shared with me it looks like you have sinusitis.  Sinusitis is inflammation and infection in the sinus cavities of the head.  Based on your presentation I believe you most likely have Acute Bacterial Sinusitis.  This is an infection caused by bacteria and is treated with antibiotics. I have prescribed Augmentin 875mg /125mg  one tablet twice daily with food, for 7 days. You may use an oral decongestant such as Mucinex D or if you have glaucoma or high blood pressure use plain Mucinex. Saline nasal spray help and can safely be used as often as needed for congestion.  If you develop worsening sinus pain, fever or notice severe headache and vision changes, or if symptoms are not better after completion of antibiotic, please schedule an appointment with a health care provider.    Sinus infections are not as easily transmitted as other respiratory infection, however we still recommend that you avoid close contact with loved ones, especially the very young and elderly.  Remember to wash your hands thoroughly throughout the day as this is the number one way to prevent the spread of infection!  Home Care:  Only take medications as instructed by your medical team.  Complete the entire course of an antibiotic.  Do not take these medications with alcohol.  A steam or ultrasonic humidifier can help congestion.  You can place a towel over your head and breathe in the steam from hot water coming from a faucet.  Avoid close contacts especially the very young and the elderly.  Cover your mouth when you cough or sneeze.  Always remember to  wash your hands.  Get Help Right Away If:  You develop worsening fever or sinus pain.  You develop a severe head ache or visual changes.  Your symptoms persist after you have completed your treatment plan.  Make sure you  Understand these instructions.  Will watch your condition.  Will get help right away if you are not doing well or get worse.  Your e-visit answers were reviewed by a board certified advanced clinical practitioner to complete your personal care plan.  Depending on the condition, your plan could have included both over the counter or prescription medications.  If there is a problem please reply  once you have received a response from your provider.  Your safety is important to [place].  If you have drug allergies check your prescription carefully.    You can use MyChart to ask questions about today's visit, request a non-urgent call back, or ask for a work or school excuse for 24 hours related to this e-Visit. If it has been greater than 24 hours you will need to follow up with your provider, or enter a new e-Visit to address those concerns.  You will get an e-mail in the next two days asking about your experience.  I hope that your e-visit has been valuable and will speed your recovery. Thank you for using e-visits.

## 2018-01-12 MED FILL — LANSOPRAZOLE DR 30 MG CAP: 30 | 90 days supply | Qty: 90 | Fill #2

## 2018-01-20 ENCOUNTER — Telehealth: Payer: 59 | Admitting: Family

## 2018-01-20 DIAGNOSIS — B9789 Other viral agents as the cause of diseases classified elsewhere: Secondary | ICD-10-CM

## 2018-01-20 DIAGNOSIS — J329 Chronic sinusitis, unspecified: Secondary | ICD-10-CM | POA: Diagnosis not present

## 2018-01-20 DIAGNOSIS — J301 Allergic rhinitis due to pollen: Secondary | ICD-10-CM

## 2018-01-20 MED ORDER — LEVOCETIRIZINE DIHYDROCHLORIDE 5 MG PO TABS: 5.0000 mg | ORAL_TABLET | Freq: Every evening | ORAL | 1 refills | Status: DC

## 2018-01-20 MED ORDER — FLUTICASONE PROPIONATE 50 MCG/ACT NA SUSP: 2.0000 | Freq: Every day | NASAL | 6 refills | Status: DC

## 2018-01-20 NOTE — Progress Notes (Signed)
E visit for Allergic Rhinitis We are sorry that you are not feeling well.  Here is how we plan to help!  Based on what you have shared with me it looks like you have Allergic Rhinitis.  Rhinitis is when a reaction occurs that causes nasal congestion, runny nose, sneezing, and itching.  Most types of rhinitis are caused by an inflammation and are associated with symptoms in the eyes ears or throat. There are several types of rhinitis.  The most common are acute rhinitis, which is usually caused by a viral illness, allergic or seasonal rhinitis, and nonallergic or year-round rhinitis.  Nasal allergies occur certain times of the year.  Allergic rhinitis is caused when allergens in the air trigger the release of histamine in the body.  Histamine causes itching, swelling, and fluid to build up in the fragile linings of the nasal passages, sinuses and eyelids.  An itchy nose and clear discharge are common.  I recommend the following over the counter treatments: You should take a daily dose of antihistamine and Xyzal 5 mg take 1 tablet daily  I also would recommend a nasal spray: Flonase 2 sprays into each nostril once daily   HOME CARE:   You can use an over-the-counter saline nasal spray as needed  Avoid areas where there is heavy dust, mites, or molds  Stay indoors on windy days during the pollen season  Keep windows closed in home, at least in bedroom; use air conditioner.  Use high-efficiency house air filter  Keep windows closed in car, turn AC on re-circulate  Avoid playing out with dog during pollen season  GET HELP RIGHT AWAY IF:   If your symptoms do not improve within 10 days  You become short of breath  You develop yellow or green discharge from your nose for over 3 days  You have coughing fits  MAKE SURE YOU:   Understand these instructions  Will watch your condition  Will get help right away if you are not doing well or get worse  Thank you for choosing an  e-visit. Your e-visit answers were reviewed by a board certified advanced clinical practitioner to complete your personal care plan. Depending upon the condition, your plan could have included both over the counter or prescription medications. Please review your pharmacy choice. Be sure that the pharmacy you have chosen is open so that you can pick up your prescription now.  If there is a problem you may message your provider in MyChart to have the prescription routed to another pharmacy. Your safety is important to us. If you have drug allergies check your prescription carefully.  For the next 24 hours, you can use MyChart to ask questions about today's visit, request a non-urgent call back, or ask for a work or school excuse from your e-visit provider. You will get an email in the next two days asking about your experience. I hope that your e-visit has been valuable and will speed your recovery.

## 2018-02-15 ENCOUNTER — Encounter: Payer: Self-pay | Admitting: Family Medicine

## 2018-02-15 ENCOUNTER — Other Ambulatory Visit: Payer: Self-pay

## 2018-02-15 ENCOUNTER — Ambulatory Visit: Payer: 59 | Admitting: Family Medicine

## 2018-02-15 VITALS — BP 113/80 | HR 74 | Temp 98.8°F | Resp 18 | Ht 64.57 in | Wt 250.8 lb

## 2018-02-15 DIAGNOSIS — H8113 Benign paroxysmal vertigo, bilateral: Secondary | ICD-10-CM | POA: Insufficient documentation

## 2018-02-15 DIAGNOSIS — J309 Allergic rhinitis, unspecified: Secondary | ICD-10-CM | POA: Insufficient documentation

## 2018-02-15 DIAGNOSIS — H8110 Benign paroxysmal vertigo, unspecified ear: Secondary | ICD-10-CM | POA: Insufficient documentation

## 2018-02-15 MED ORDER — MECLIZINE HCL 12.5 MG PO TABS: 12.5000 mg | ORAL_TABLET | Freq: Three times a day (TID) | ORAL | 0 refills | Status: DC | PRN

## 2018-02-15 NOTE — Progress Notes (Signed)
Established Patient Office Visit  Subjective:  Patient ID: Monica Hernandez, female    DOB: [birth year]  Age: 42 y.o. MRN: [id number]  CC:  Chief Complaint  Patient presents with  . Ear Pain    X 1.5 weeks both ears    HPI Monica Hernandez presents for patient is here with congestion, sinus pressure and bilateral ear pain She came in because this morning she bent down to put her shoes on and when she came up she felt pain in both ears.  She states that she also swiveled in her chair at work and the room was spinning She was recently [place] the [place] and felt pain and congestion She has nausea as well.  She denies a history of vertigo but has chronic allergic rhinitis She stopped xyzal because it caused migraines   Past Medical History:  Diagnosis Date  . Allergy   . Anemia   . Asthma   . GERD (gastroesophageal reflux disease)   . History of kidney stones   . Hypertension   . Kidney stone   . Mitral valve prolapse    was told as a kid that she had this but during ECHO for eval, ECHO did not indicate this , see 08-25-16 epic result   . PONV (postoperative nausea and vomiting)    epidural with c section caused N/V   . Pre-diabetes   . Ulcer     Past Surgical History:  Procedure Laterality Date  . ABDOMINAL HYSTERECTOMY    . ADENOIDECTOMY, TONSILLECTOMY AND MYRINGOTOMY WITH TUBE PLACEMENT    . CESAREAN SECTION    . FRACTURE SURGERY    . GASTRIC ROUX-EN-Y N/A 03/31/2017   Procedure: LAPAROSCOPIC ROUX-EN-Y GASTRIC BYPASS WITH UPPER ENDOSCOPY;  Surgeon: Sheliah Hatch, De Blanch, MD;  Location: [facility];  Service: General;  Laterality: N/A;  . LEG SURGERY Left   . TUBAL LIGATION      Family History  Problem Relation Age of Onset  . Hypertension Mother   . Stroke Mother   . Thyroid cancer Mother   . Stroke Father   . Lung cancer Father   . Brain cancer Father   . Bone cancer Father   . Hypertension Sister   . Heart disease Maternal Grandmother   . Diabetes  Maternal Grandmother   . Asthma Other   . Cancer Other   . Stomach cancer Neg Hx   . Colon cancer Neg Hx     Social History   Socioeconomic History  . Marital status: Married    Spouse name: Not on file  . Number of children: 1  . Years of education: Not on file  . Highest education level: Not on file  Occupational History    Employer: [employer]    Comment: heart care  Social Needs  . Financial resource strain: Not on file  . Food insecurity:    Worry: Not on file    Inability: Not on file  . Transportation needs:    Medical: Not on file    Non-medical: Not on file  Tobacco Use  . Smoking status: Former Smoker    Types: Cigarettes    Last attempt to quit: 2010    Years since quitting: 9.9  . Smokeless tobacco: Never Used  . Tobacco comment: smoked since age 58 ; quit in 2010   Substance and Sexual Activity  . Alcohol use: No  . Drug use: No  . Sexual activity: Yes    Partners: Male  Birth control/protection: Surgical  Lifestyle  . Physical activity:    Days per week: Not on file    Minutes per session: Not on file  . Stress: Not on file  Relationships  . Social connections:    Talks on phone: Not on file    Gets together: Not on file    Attends religious service: Not on file    Active member of club or organization: Not on file    Attends meetings of clubs or organizations: Not on file    Relationship status: Not on file  . Intimate partner violence:    Fear of current or ex partner: Not on file    Emotionally abused: Not on file    Physically abused: Not on file    Forced sexual activity: Not on file  Other Topics Concern  . Not on file  Social History Narrative  . Not on file    Outpatient Medications Prior to Visit  Medication Sig Dispense Refill  . acetaminophen (TYLENOL) 500 MG tablet Take 1,000 mg by mouth every 6 (six) hours as needed for mild pain or moderate pain.    . Calcium-Phosphorus-Vitamin D (CALCIUM/VITAMIN D3/ADULT GUMMY)  250-100-500 MG-MG-UNIT CHEW Chew 2 tablets by mouth 3 (three) times daily.    . Cholecalciferol 2000 units CAPS Take 2,000 Units by mouth daily.    Marland Kitchen. dicyclomine (BENTYL) 10 MG capsule Take 1 capsule (10 mg total) by mouth 3 (three) times daily before meals. (Patient taking differently: Take 10 mg by mouth every morning. ) 270 capsule 3  . fluticasone (FLONASE) 50 MCG/ACT nasal spray Place 2 sprays into both nostrils daily. 16 g 6  . lansoprazole (PREVACID) 30 MG capsule Take 1 capsule (30 mg total) by mouth daily. 90 capsule 3  . Multiple Vitamins-Minerals (BARIATRIC MULTIVITAMINS/IRON PO) Take 2 tablets by mouth 2 (two) times daily. Chewable tablets    . ondansetron (ZOFRAN ODT) 4 MG disintegrating tablet Take 1 tablet (4 mg total) by mouth every 8 (eight) hours as needed for nausea or vomiting. 6 tablet 0  . levocetirizine (XYZAL) 5 MG tablet Take 1 tablet (5 mg total) by mouth every evening. (Patient not taking: Reported on 02/15/2018) 90 tablet 1   No facility-administered medications prior to visit.     Allergies  Allergen Reactions  . Contrave [Naltrexone-Bupropion Hcl Er] Nausea And Vomiting    Pt states it causes bloody stool, bloody vomit, and heart palpitations  . Erythromycin Nausea And Vomiting and Rash    Wheezing   . Shellfish Allergy Anaphylaxis  . Zithromax [Azithromycin] Nausea And Vomiting, Rash and Other (See Comments)  . Belviq [Lorcaserin Hcl] Other (See Comments)    Causes changes in vision and respiratory issues  . Qsymia [Phentermine-Topiramate] Other (See Comments)    Causes change in vision and respiratory issues   . Saxenda [Liraglutide -Weight Management] Other (See Comments)    Causes fainting  . Allegra-D [Fexofenadine-Pseudoephed Er] Nausea And Vomiting  . Naltrexone Other (See Comments)    Bloody stools.   . Fish Allergy Rash  . Sulfa Antibiotics Rash    ROS Review of Systems Review of Systems  Constitutional: Negative for activity change, appetite  change, chills and fever.  HENT: see hpi Respiratory: Negative for cough, shortness of breath and wheezing.   Gastrointestinal: Negative for diarrhea, nausea and vomiting.  Genitourinary: Negative for difficulty urinating, dysuria, flank pain and hematuria.  Musculoskeletal: Negative for back pain, joint swelling and neck pain.  Neurological: see hpi See  HPI. All other review of systems negative.     Objective:    Physical Exam  BP 113/80   Pulse 74   Temp 98.8 F (37.1 C) (Oral)   Resp 18   Ht 5' 4.57" (1.64 m)   Wt 250 lb 12.8 oz (113.8 kg)   SpO2 96%   BMI 42.30 kg/m  Wt Readings from Last 3 Encounters:  02/15/18 250 lb 12.8 oz (113.8 kg)  08/27/17 263 lb 3.2 oz (119.4 kg)  07/06/17 265 lb 12.8 oz (120.6 kg)   General: alert, oriented, in NAD Head: normocephalic, atraumatic, + frontal and maxillary sinus tenderness Eyes: EOM intact, no scleral icterus or conjunctival injection Ears: TM clear bilaterally, bulging fluid  Nose: mucosa nonerythematous, nonedematous Throat: no pharyngeal exudate or erythema Lymph: no posterior auricular, submental or cervical lymph adenopathy Heart: normal rate, normal sinus rhythm, no murmurs Lungs: clear to auscultation bilaterally, no wheezing    There are no preventive care reminders to display for this patient.  There are no preventive care reminders to display for this patient.  Lab Results  Component Value Date   TSH 2.550 07/06/2017   Lab Results  Component Value Date   WBC 10.0 10/28/2017   HGB 13.0 10/28/2017   HCT 39.3 10/28/2017   MCV 90.3 10/28/2017   PLT 249 10/28/2017   Lab Results  Component Value Date   NA 141 10/28/2017   K 3.8 10/28/2017   CO2 28 10/28/2017   GLUCOSE 91 10/28/2017   BUN 13 10/28/2017   CREATININE 0.89 10/28/2017   BILITOT 0.4 10/28/2017   ALKPHOS 100 10/28/2017   AST 21 10/28/2017   ALT 21 10/28/2017   PROT 7.5 10/28/2017   ALBUMIN 4.0 10/28/2017   CALCIUM 9.1 10/28/2017    ANIONGAP 8 10/28/2017   Lab Results  Component Value Date   CHOL 169 07/06/2017   Lab Results  Component Value Date   HDL 50 07/06/2017   Lab Results  Component Value Date   LDLCALC 102 (H) 07/06/2017   Lab Results  Component Value Date   TRIG 87 07/06/2017   Lab Results  Component Value Date   CHOLHDL 3.4 07/06/2017   Lab Results  Component Value Date   HGBA1C 5.5 07/06/2017      Assessment & Plan:   Problem List Items Addressed This Visit      Respiratory   Chronic allergic rhinitis - Primary    Referral placed for Allergist for this chronic issue      Relevant Orders   Ambulatory referral to Allergy     Nervous and Auditory   Benign paroxysmal positional vertigo due to bilateral vestibular disorder    Advised pt to see Allergist. Increase flonase to BID. Try meclizine. If congestion does not improve to notify this provider for vestibular rehab.      Relevant Medications   meclizine (ANTIVERT) 12.5 MG tablet   Other Relevant Orders   Ambulatory referral to Allergy      Meds ordered this encounter  Medications  . meclizine (ANTIVERT) 12.5 MG tablet    Sig: Take 1 tablet (12.5 mg total) by mouth 3 (three) times daily as needed for dizziness.    Dispense:  30 tablet    Refill:  0    Follow-up: No follow-ups on file.    Doristine Bosworth, MD

## 2018-02-15 NOTE — Assessment & Plan Note (Signed)
Referral placed for Allergist for this chronic issue

## 2018-02-15 NOTE — Assessment & Plan Note (Signed)
Advised pt to see Allergist. Increase flonase to BID. Try meclizine. If congestion does not improve to notify this provider for vestibular rehab.

## 2018-02-15 NOTE — Patient Instructions (Addendum)
   If you have lab work done today you will be contacted with your lab results within the next 2 weeks.  If you have not heard from us then please contact us. The fastest way to get your results is to register for My Chart.   IF you received an x-ray today, you will receive an invoice from [facility]. Please contact [facility] at [phone] with questions or concerns regarding your invoice.   IF you received labwork today, you will receive an invoice from [facility]. Please contact [facility] at [phone] with questions or concerns regarding your invoice.   Our billing staff will not be able to assist you with questions regarding bills from these companies.  You will be contacted with the lab results as soon as they are available. The fastest way to get your results is to activate your My Chart account. Instructions are located on the last page of this paperwork. If you have not heard from us regarding the results in 2 weeks, please contact this office.    Vertigo Vertigo is the feeling that you or your surroundings are moving when they are not. Vertigo can be dangerous if it occurs while you are doing something that could endanger you or others, such as driving. What are the causes? This condition is caused by a disturbance in the signals that are sent by your body's sensory systems to your brain. Different causes of a disturbance can lead to vertigo, including:  Infections, especially in the inner ear.  A bad reaction to a drug, or misuse of alcohol and medicines.  Withdrawal from drugs or alcohol.  Quickly changing positions, as when lying down or rolling over in bed.  Migraine headaches.  Decreased blood flow to the brain.  Decreased blood pressure.  Increased pressure in the brain from a head or neck injury, stroke, infection, tumor, or bleeding.  Central nervous system disorders.  What are the signs or symptoms? Symptoms of this condition usually  occur when you move your head or your eyes in different directions. Symptoms may start suddenly, and they usually last for less than a minute. Symptoms may include:  Loss of balance and falling.  Feeling like you are spinning or moving.  Feeling like your surroundings are spinning or moving.  Nausea and vomiting.  Blurred vision or double vision.  Difficulty hearing.  Slurred speech.  Dizziness.  Involuntary eye movement (nystagmus).  Symptoms can be mild and cause only slight annoyance, or they can be severe and interfere with daily life. Episodes of vertigo may return (recur) over time, and they are often triggered by certain movements. Symptoms may improve over time. How is this diagnosed? This condition may be diagnosed based on medical history and the quality of your nystagmus. Your health care provider may test your eye movements by asking you to quickly change positions to trigger the nystagmus. This may be called the Dix-Hallpike test, head thrust test, or roll test. You may be referred to a health care provider who specializes in ear, nose, and throat (ENT) problems (otolaryngologist) or a provider who specializes in disorders of the central nervous system (neurologist). You may have additional testing, including:  A physical exam.  Blood tests.  MRI.  A CT scan.  An electrocardiogram (ECG). This records electrical activity in your heart.  An electroencephalogram (EEG). This records electrical activity in your brain.  Hearing tests.  How is this treated? Treatment for this condition depends on the cause and the severity   of the symptoms. Treatment options include:  Medicines to treat nausea or vertigo. These are usually used for severe cases. Some medicines that are used to treat other conditions may also reduce or eliminate vertigo symptoms. These include: ? Medicines that control allergies (antihistamines). ? Medicines that control seizures  (anticonvulsants). ? Medicines that relieve depression (antidepressants). ? Medicines that relieve anxiety (sedatives).  Head movements to adjust your inner ear back to normal. If your vertigo is caused by an ear problem, your health care provider may recommend certain movements to correct the problem.  Surgery. This is rare.  Follow these instructions at home: Safety  Move slowly.Avoid sudden body or head movements.  Avoid driving.  Avoid operating heavy machinery.  Avoid doing any tasks that would cause danger to you or others if you would have a vertigo episode during the task.  If you have trouble walking or keeping your balance, try using a cane for stability. If you feel dizzy or unstable, sit down right away.  Return to your normal activities as told by your health care provider. Ask your health care provider what activities are safe for you. General instructions  Take over-the-counter and prescription medicines only as told by your health care provider.  Avoid certain positions or movements as told by your health care provider.  Drink enough fluid to keep your urine clear or pale yellow.  Keep all follow-up visits as told by your health care provider. This is important. Contact a health care provider if:  Your medicines do not relieve your vertigo or they make it worse.  You have a fever.  Your condition gets worse or you develop new symptoms.  Your family or friends notice any behavioral changes.  Your nausea or vomiting gets worse.  You have numbness or a "pins and needles" sensation in part of your body. Get help right away if:  You have difficulty moving or speaking.  You are always dizzy.  You faint.  You develop severe headaches.  You have weakness in your hands, arms, or legs.  You have changes in your hearing or vision.  You develop a stiff neck.  You develop sensitivity to light. This information is not intended to replace advice given to  you by your health care provider. Make sure you discuss any questions you have with your health care provider. Document Released: 12/04/2004 Document Revised: 08/08/2015 Document Reviewed: 06/19/2014 Elsevier Interactive Patient Education  2018 Elsevier Inc.  

## 2018-03-08 ENCOUNTER — Other Ambulatory Visit (HOSPITAL_COMMUNITY): Payer: Self-pay | Admitting: Family Medicine

## 2018-03-08 DIAGNOSIS — Z1231 Encounter for screening mammogram for malignant neoplasm of breast: Secondary | ICD-10-CM

## 2018-03-13 ENCOUNTER — Telehealth: Payer: 59 | Admitting: Family

## 2018-03-13 DIAGNOSIS — H1010 Acute atopic conjunctivitis, unspecified eye: Secondary | ICD-10-CM

## 2018-03-13 NOTE — Progress Notes (Signed)
We are sorry that you are not feeling well.  Here is how we plan to help!  Based on what you have shared with me it looks like you have conjunctivitis.  Conjunctivitis is a common inflammatory or infectious condition of the eye that is often referred to as "pink eye".  In most cases it is contagious (viral or bacterial). However, not all conjunctivitis requires antibiotics (ex. Allergic).  We have made appropriate suggestions for you based upon your presentation.  I recommend that you use OpconA, 1-2 drops every 4-6 hours (an over the counter allergy drop available at your local pharmacy).  Your pharmacist may have an alternative suggestion.  Pink eye can be highly contagious.  It is typically spread through direct contact with secretions, or contaminated objects or surfaces that one may have touched.  Strict handwashing is suggested with soap and water is urged.  If not available, use alcohol based had sanitizer.  Avoid unnecessary touching of the eye.  If you wear contact lenses, you will need to refrain from wearing them until you see no white discharge from the eye for at least 24 hours after being on medication.  You should see symptom improvement in 1-2 days after starting the medication regimen.  Call us if symptoms are not improved in 1-2 days.  Home Care:  Wash your hands often!  Do not wear your contacts until you complete your treatment plan.  Avoid sharing towels, bed linen, personal items with a person who has pink eye.  See attention for anyone in your home with similar symptoms.  Get Help Right Away If:  Your symptoms do not improve.  You develop blurred or loss of vision.  Your symptoms worsen (increased discharge, pain or redness)  Your e-visit answers were reviewed by a board certified advanced clinical practitioner to complete your personal care plan.  Depending on the condition, your plan could have included both over the counter or prescription medications.  If there  is a problem please reply  once you have received a response from your provider.  Your safety is important to us.  If you have drug allergies check your prescription carefully.    You can use MyChart to ask questions about today's visit, request a non-urgent call back, or ask for a work or school excuse for 24 hours related to this e-Visit. If it has been greater than 24 hours you will need to follow up with your provider, or enter a new e-Visit to address those concerns.   You will get an e-mail in the next two days asking about your experience.  I hope that your e-visit has been valuable and will speed your recovery. Thank you for using e-visits.     

## 2018-03-26 ENCOUNTER — Telehealth: Payer: 59 | Admitting: Nurse Practitioner

## 2018-03-26 DIAGNOSIS — J020 Streptococcal pharyngitis: Secondary | ICD-10-CM

## 2018-03-26 NOTE — Progress Notes (Signed)
Based on what you shared with me it looks like you have strep,that should be evaluated in a face to face office visit. I am sorry but we cannot do an eviist for someone in someone elses chart. We also do not treat anyone under the age of 73 in evisit. You can take him to any of the places listed below or contact his PCP.   NOTE: If you entered your credit card information for this eVisit, you will not be charged. You may see a "hold" on your card for the $30 but that hold will drop off and you will not have a charge processed.  If you are having a true medical emergency please call 911.  If you need an urgent face to face visit, [place] has four urgent care centers for your convenience.  If you need care fast and have a high deductible or no insurance consider:   [email or web address] to reserve your spot online an avoid wait times  [facility] [address], [address] [place], [place] [address] 8 am to 8 pm Monday-Friday 10 am to 4 pm Saturday-Sunday *Across the street from [employer]  [address] [place], [address] 8 am to 5 pm Monday-Friday * In the [facility] on the [facility]   The following sites will take your  insurance:  . [facility]  [phone] Get Driving Directions Find a Provider at this Location  [address] [place], [place] [address] . 10 am to 8 pm Monday-Friday . 12 pm to 8 pm Saturday-Sunday   . [facility] at [facility]  [phone] Get Driving Directions Find a Provider at this Location  [address] [place], [place] [address] . 8 am to 8 pm Monday-Friday . 9 am to 6 pm Saturday . 11 am to 6 pm Sunday   . [facility] at [facility]  [phone] Get Driving Directions  [address] [place], [place] [address] . 8 am to 8 pm Monday-Friday . 8 am to 4 pm Saturday-Sunday   Your e-visit  answers were reviewed by a board certified advanced clinical practitioner to complete your personal care plan.

## 2018-04-01 ENCOUNTER — Encounter (HOSPITAL_COMMUNITY): Payer: Self-pay

## 2018-04-01 ENCOUNTER — Encounter: Payer: Self-pay | Admitting: Family Medicine

## 2018-04-01 ENCOUNTER — Encounter: Payer: Self-pay | Admitting: Allergy & Immunology

## 2018-04-01 ENCOUNTER — Ambulatory Visit: Payer: 59 | Admitting: Family Medicine

## 2018-04-01 ENCOUNTER — Other Ambulatory Visit: Payer: Self-pay

## 2018-04-01 ENCOUNTER — Ambulatory Visit (HOSPITAL_COMMUNITY)
Admission: RE | Admit: 2018-04-01 | Discharge: 2018-04-01 | Disposition: A | Discharge status: Home / Self Care | Payer: 59 | Source: Ambulatory Visit | Attending: Family Medicine | Admitting: Family Medicine

## 2018-04-01 ENCOUNTER — Ambulatory Visit: Payer: 59 | Admitting: Allergy & Immunology

## 2018-04-01 VITALS — BP 132/82 | HR 82 | Temp 98.3°F | Resp 16 | Ht 64.0 in | Wt 249.0 lb

## 2018-04-01 DIAGNOSIS — Z1231 Encounter for screening mammogram for malignant neoplasm of breast: Secondary | ICD-10-CM | POA: Insufficient documentation

## 2018-04-01 DIAGNOSIS — J3089 Other allergic rhinitis: Secondary | ICD-10-CM | POA: Diagnosis not present

## 2018-04-01 DIAGNOSIS — R1013 Epigastric pain: Secondary | ICD-10-CM

## 2018-04-01 DIAGNOSIS — J4520 Mild intermittent asthma, uncomplicated: Secondary | ICD-10-CM

## 2018-04-01 DIAGNOSIS — T7800XD Anaphylactic reaction due to unspecified food, subsequent encounter: Secondary | ICD-10-CM | POA: Diagnosis not present

## 2018-04-01 DIAGNOSIS — K319 Disease of stomach and duodenum, unspecified: Secondary | ICD-10-CM

## 2018-04-01 DIAGNOSIS — J302 Other seasonal allergic rhinitis: Secondary | ICD-10-CM | POA: Diagnosis not present

## 2018-04-01 DIAGNOSIS — R5383 Other fatigue: Secondary | ICD-10-CM | POA: Diagnosis not present

## 2018-04-01 DIAGNOSIS — Z9884 Bariatric surgery status: Secondary | ICD-10-CM | POA: Diagnosis not present

## 2018-04-01 MED ORDER — CARBINOXAMINE MALEATE 6 MG PO TABS: 6.0000 mg | ORAL_TABLET | ORAL | 2 refills | Status: DC | PRN

## 2018-04-01 MED ORDER — AZELASTINE HCL 0.1 % NA SOLN: 2.0000 | Freq: Two times a day (BID) | NASAL | 2 refills | Status: DC | PRN

## 2018-04-01 MED ORDER — EPINEPHRINE 0.3 MG/0.3ML IJ SOAJ: 0.3000 mg | Freq: Once | INTRAMUSCULAR | 2 refills | Status: AC

## 2018-04-01 MED ORDER — LANSOPRAZOLE 30 MG PO CPDR: 30.0000 mg | DELAYED_RELEASE_CAPSULE | Freq: Every day | ORAL | 0 refills | Status: DC | PRN

## 2018-04-01 MED FILL — LANSOPRAZOLE DR 30 MG CAP: 30 | 90 days supply | Qty: 90 | Fill #0

## 2018-04-01 NOTE — Patient Instructions (Addendum)
If you have lab work done today you will be contacted with your lab results within the next 2 weeks.  If you have not heard from [place] then please contact us. The fastest way to get your results is to register for My Chart.   IF you received an x-ray today, you will receive an invoice from [facility]. Please contact [facility] at [phone] with questions or concerns regarding your invoice.   IF you received labwork today, you will receive an invoice from [facility]. Please contact [facility] at [phone] with questions or concerns regarding your invoice.   Our billing staff will not be able to assist you with questions regarding bills from these companies.  You will be contacted with the lab results as soon as they are available. The fastest way to get your results is to activate your My Chart account. Instructions are located on the last page of this paperwork. If you have not heard from [place] regarding the results in 2 weeks, please contact this office.     Bariatric Surgery, Care After This sheet gives you information about how to care for yourself after your procedure. Your health care provider may also give you more specific instructions. If you have problems or questions, contact your health care provider. What can I expect after the procedure? After the procedure, it is common to have:  Soreness.  Sluggishness.  Tiredness.  Moodiness.  Chilliness.  Dry skin.  Some hair loss. Follow these instructions at home: Eating and drinking   Follow instructions from your health care provider about: ? What to eat and drink. He or she may recommend that you start by eating pureed foods and liquids at first. Then you may move on to soft foods. Over time, you may slowly resume a more normal, healthy diet. ? How much to eat and drink. You should eat small meals often.  Eat slowly to avoid discomfort and vomiting. Take small bites, and chew thoroughly before  swallowing.  Drink liquids between meals instead of during meal time.  Focus on eating high-protein foods. Avoid foods that are high in sugar and fat.  Take vitamin and mineral supplements as recommended by your health care provider.  Stop eating when you feel full. Incision care   Follow instructions from your health care provider about how to take care of your incision. Make sure you: ? Wash your hands with soap and water before you change your bandage (dressing). If soap and water are not available, use hand sanitizer. ? Change your dressing as told by your health care provider. ? Leave stitches (sutures), skin glue, or adhesive strips in place. These skin closures may need to stay in place for 2 weeks or longer. If adhesive strip edges start to loosen and curl up, you may trim the loose edges. Do not remove adhesive strips completely unless your health care provider tells you to do that.  Check your incision area every day for signs of infection. Check for: ? Redness, swelling, or pain. ? Fluid or blood. ? Warmth. ? Pus or a bad smell. Bathing  Do not take baths, swim, or use a hot tub until your health care provider approves. Ask your health care provider if you may take showers. You may need to take sponge baths until your health care provider tells you otherwise. Activity  Do not drive until your health care provider approves.  Do not drive or use heavy machinery while taking prescription pain medicine.  Take short walks several times a day. Walking helps you recover, and helps to prevent blood clots. Ask your health care provider what other activities are safe for you.  Rest when you get tired. It may take several weeks for your normal energy levels to return.  Do not lift anything that is heavier than 10 lb (4.5 kg), or the limit that your health care provider tells you, until he or she says that it is safe. Lifestyle  Do not drink alcohol until your health care  provider approves.  Do not use any products that contain nicotine or tobacco, such as cigarettes and e-cigarettes. If you need help quitting, ask your health care provider. General instructions  Take over-the-counter and prescription medicines only as told by your health care provider.  To prevent or treat constipation while you are taking prescription pain medicine, your health care provider may recommend that you: ? Drink enough fluid to keep your urine clear or pale yellow. ? Take over-the-counter or prescription medicines. ? Eat foods that are high in fiber, such as fresh fruits and vegetables, whole grains, and beans. ? Limit foods that are high in fat and processed sugars, such as fried and sweet foods.  Keep all follow-up visits as told by your health care provider. This is important. Contact a health care provider if:  You have pain or swelling in your thigh, behind your knee, or in your lower leg.  You have redness, swelling, or pain around your incision.  You have fluid or blood coming from your incision.  Your incision feels warm to the touch.  You have pus or a bad smell coming from your incision.  You have a fever.  Your incision opens after your sutures, skin glue, or adhesive strips have been removed.  You develop episodes of dizziness.  You faint.  You have shortness of breath.  You have nausea or vomiting that does not go away.  Your soreness gets worse instead of better. Get help right away if:  You have sudden, severe abdominal pain or swelling.  You develop a rash.  You have difficulty breathing. Summary  After your procedure, it is common to feel sore and to get tired easily.  Take short walks several times a day. Walking helps you recover, and helps to prevent blood clots after surgery.  Avoid lifting anything that is heavier than 10 lb (4.5 kg) for 6 weeks, or for as long as told by your health care provider. This information is not intended  to replace advice given to you by your health care provider. Make sure you discuss any questions you have with your health care provider. Document Released: 02/24/2005 Document Revised: 03/31/2016 Document Reviewed: 03/31/2016 Elsevier Interactive Patient Education  2019 [employer].

## 2018-04-01 NOTE — Progress Notes (Signed)
Established Patient Office Visit  Subjective:  Patient ID: Monica Hernandez, female    DOB: April 17, 1975  Age: 43 y.o. MRN: [id number]  CC:  Chief Complaint  Patient presents with  . Follow-up    labs  . Medication Refill    prevacid    HPI Trica Mcelreath presents for   Pt is s/p roux en Y  She reports that she is having fatigue and hot flashes She notes very dry skin as well She states that at 5pm she is very sleep and could sleep all night She is concerned about vitamin deficiency Her preop weight was 317 She had roux en Y on 03/31/2018 Wt Readings from Last 3 Encounters:  05/13/18 248 lb (112.5 kg)  04/01/18 249 lb (112.9 kg)  04/01/18 248 lb 9.6 oz (112.8 kg)   She is exercising but not as much as she would like to  She states that she gets ankle swelling from the prevacid as a side effect She denies reflux and was started on it postop She is wondering if she still needs it She takes Ca-Phos-Vit 250-100-500 and a vitamin D 2000 units bid as well as bid multivitamin     Past Medical History:  Diagnosis Date  . Allergy   . Anemia   . Asthma   . Eczema   . GERD (gastroesophageal reflux disease)   . History of kidney stones   . Hypertension   . Kidney stone   . Mitral valve prolapse    was told as a kid that she had this but during ECHO for eval, ECHO did not indicate this , see 08-25-16 epic result   . PONV (postoperative nausea and vomiting)    epidural with c section caused N/V   . Pre-diabetes   . Recurrent upper respiratory infection (URI)   . Ulcer     Past Surgical History:  Procedure Laterality Date  . ABDOMINAL HYSTERECTOMY    . ADENOIDECTOMY    . ADENOIDECTOMY, TONSILLECTOMY AND MYRINGOTOMY WITH TUBE PLACEMENT    . CESAREAN SECTION    . FRACTURE SURGERY    . GASTRIC ROUX-EN-Y N/A 03/31/2017   Procedure: LAPAROSCOPIC ROUX-EN-Y GASTRIC BYPASS WITH UPPER ENDOSCOPY;  Surgeon: Sheliah Hatch, De Blanch, MD;  Location: [facility];  Service: General;   Laterality: N/A;  . LEG SURGERY Left   . TONSILLECTOMY    . TUBAL LIGATION      Family History  Problem Relation Age of Onset  . Hypertension Mother   . Stroke Mother   . Thyroid cancer Mother   . Stroke Father   . Lung cancer Father   . Brain cancer Father   . Bone cancer Father   . Hypertension Sister   . Heart disease Maternal Grandmother   . Diabetes Maternal Grandmother   . Asthma Other   . Cancer Other   . Stomach cancer Neg Hx   . Colon cancer Neg Hx     Social History   Socioeconomic History  . Marital status: Married    Spouse name: Not on file  . Number of children: 1  . Years of education: Not on file  . Highest education level: Not on file  Occupational History    Employer: [employer]    Comment: heart care  Social Needs  . Financial resource strain: Not on file  . Food insecurity:    Worry: Not on file    Inability: Not on file  . Transportation needs:  Medical: Not on file    Non-medical: Not on file  Tobacco Use  . Smoking status: Former Smoker    Types: Cigarettes    Last attempt to quit: 2010    Years since quitting: 10.1  . Smokeless tobacco: Never Used  . Tobacco comment: smoked since age 73 ; quit in 2010   Substance and Sexual Activity  . Alcohol use: No  . Drug use: No  . Sexual activity: Yes    Partners: Male    Birth control/protection: Surgical  Lifestyle  . Physical activity:    Days per week: Not on file    Minutes per session: Not on file  . Stress: Not on file  Relationships  . Social connections:    Talks on phone: Not on file    Gets together: Not on file    Attends religious service: Not on file    Active member of club or organization: Not on file    Attends meetings of clubs or organizations: Not on file    Relationship status: Not on file  . Intimate partner violence:    Fear of current or ex partner: Not on file    Emotionally abused: Not on file    Physically abused: Not on file    Forced sexual activity:  Not on file  Other Topics Concern  . Not on file  Social History Narrative  . Not on file    Outpatient Medications Prior to Visit  Medication Sig Dispense Refill  . acetaminophen (TYLENOL) 500 MG tablet Take 1,000 mg by mouth every 6 (six) hours as needed for mild pain or moderate pain.    . Calcium-Phosphorus-Vitamin D (CALCIUM/VITAMIN D3/ADULT GUMMY) 250-100-500 MG-MG-UNIT CHEW Chew 2 tablets by mouth 3 (three) times daily.    . Cholecalciferol 2000 units CAPS Take 2,000 Units by mouth daily.    Marland Kitchen dicyclomine (BENTYL) 10 MG capsule Take 1 capsule (10 mg total) by mouth 3 (three) times daily before meals. (Patient taking differently: Take 10 mg by mouth every morning. ) 270 capsule 3  . meclizine (ANTIVERT) 12.5 MG tablet Take 1 tablet (12.5 mg total) by mouth 3 (three) times daily as needed for dizziness. (Patient not taking: Reported on 04/01/2018) 30 tablet 0  . Multiple Vitamins-Minerals (BARIATRIC MULTIVITAMINS/IRON PO) Take 2 tablets by mouth 2 (two) times daily. Chewable tablets    . ondansetron (ZOFRAN ODT) 4 MG disintegrating tablet Take 1 tablet (4 mg total) by mouth every 8 (eight) hours as needed for nausea or vomiting. 6 tablet 0  . lansoprazole (PREVACID) 30 MG capsule Take 1 capsule (30 mg total) by mouth daily. 90 capsule 3  . levocetirizine (XYZAL) 5 MG tablet Take 1 tablet (5 mg total) by mouth every evening. 90 tablet 1  . fluticasone (FLONASE) 50 MCG/ACT nasal spray Place 2 sprays into both nostrils daily. (Patient not taking: Reported on 04/01/2018) 16 g 6   No facility-administered medications prior to visit.     Allergies  Allergen Reactions  . Contrave [Naltrexone-Bupropion Hcl Er] Nausea And Vomiting    Pt states it causes bloody stool, bloody vomit, and heart palpitations  . Erythromycin Nausea And Vomiting and Rash    Wheezing   . Shellfish Allergy Anaphylaxis  . Zithromax [Azithromycin] Nausea And Vomiting, Rash and Other (See Comments)  . Belviq [Lorcaserin  Hcl] Other (See Comments)    Causes changes in vision and respiratory issues  . Qsymia [Phentermine-Topiramate] Other (See Comments)    Causes change in  vision and respiratory issues   . Saxenda [Liraglutide -Weight Management] Other (See Comments)    Causes fainting  . Allegra-D [Fexofenadine-Pseudoephed Er] Nausea And Vomiting  . Levocetirizine Other (See Comments)    headache  . Naltrexone Other (See Comments)    Bloody stools.   . Fish Allergy Rash  . Sulfa Antibiotics Rash    ROS Review of Systems    Objective:    Physical Exam  BP 131/82 (BP Location: Right Arm, Patient Position: Sitting, Cuff Size: Large)   Pulse 65   Temp 98.1 F (36.7 C) (Oral)   Resp 17   Ht 5' 4.57" (1.64 m)   Wt 248 lb 9.6 oz (112.8 kg)   SpO2 99%   BMI 41.92 kg/m  Wt Readings from Last 3 Encounters:  05/13/18 248 lb (112.5 kg)  04/01/18 249 lb (112.9 kg)  04/01/18 248 lb 9.6 oz (112.8 kg)   Physical Exam  Constitutional: Oriented to person, place, and time. Appears well-developed and well-nourished.  HENT:  Head: Normocephalic and atraumatic.  Eyes: Conjunctivae and EOM are normal.  Cardiovascular: Normal rate, regular rhythm, normal heart sounds and intact distal pulses.  No murmur heard. Pulmonary/Chest: Effort normal and breath sounds normal. No stridor. No respiratory distress. Has no wheezes.  Neurological: Is alert and oriented to person, place, and time.  Skin: Skin is warm. Capillary refill takes less than 2 seconds.  Psychiatric: Has a normal mood and affect. Behavior is normal. Judgment and thought content normal.    There are no preventive care reminders to display for this patient.  There are no preventive care reminders to display for this patient.  Lab Results  Component Value Date   TSH 1.760 04/01/2018   Lab Results  Component Value Date   WBC 8.4 04/01/2018   HGB 12.7 04/01/2018   HCT 38.2 04/01/2018   MCV 88 04/01/2018   PLT 264 04/01/2018   Lab Results   Component Value Date   NA 141 10/28/2017   K 3.8 10/28/2017   CO2 28 10/28/2017   GLUCOSE 91 10/28/2017   BUN 13 10/28/2017   CREATININE 0.89 10/28/2017   BILITOT 0.4 10/28/2017   ALKPHOS 100 10/28/2017   AST 21 10/28/2017   ALT 21 10/28/2017   PROT 7.5 10/28/2017   ALBUMIN 4.0 10/28/2017   CALCIUM 9.1 10/28/2017   ANIONGAP 8 10/28/2017   Lab Results  Component Value Date   CHOL 169 07/06/2017   Lab Results  Component Value Date   HDL 50 07/06/2017   Lab Results  Component Value Date   LDLCALC 102 (H) 07/06/2017   Lab Results  Component Value Date   TRIG 87 07/06/2017   Lab Results  Component Value Date   CHOLHDL 3.4 07/06/2017   Lab Results  Component Value Date   HGBA1C 5.7 (H) 04/01/2018      Assessment & Plan:   Problem List Items Addressed This Visit      Other   Morbid obesity (HCC) - Primary  -  Discussed weight loss   Relevant Orders   Hemoglobin A1c (Completed)   Vitamin B12 (Completed)   CBC (Completed)   TSH + free T4 (Completed)    Other Visit Diagnoses    History of Roux-en-Y gastric bypass    -  Discussed ways to treat symtoms   Relevant Orders   CBC (Completed)   TSH + free T4 (Completed)   Other fatigue    -  Will check underlying factors  Relevant Orders   VITAMIN D 25 Hydroxy (Vit-D Deficiency, Fractures) (Completed)   CBC (Completed)   TSH + free T4 (Completed)   Dyspepsia and disorder of function of stomach    -  Small meals, prevacid   Relevant Medications   lansoprazole (PREVACID) 30 MG capsule      Meds ordered this encounter  Medications  . lansoprazole (PREVACID) 30 MG capsule    Sig: Take 1 capsule (30 mg total) by mouth daily as needed.    Dispense:  90 capsule    Refill:  0    Follow-up: Return in about 6 months (around 09/30/2018) for follow up weight check .    Doristine Bosworth, MD

## 2018-04-01 NOTE — Patient Instructions (Addendum)
1. Seasonal and perennial allergic rhinitis - Testing today showed: tobacco, trees, weeds, grasses, indoor molds, outdoor molds, dust mites, dog and cockroach - Copy of test results provided.  - Avoidance measures provided. - Stop taking: Claritin - Continue with: Nasacort (triamcinolone) one spray per nostril daily - Start taking: Ryvent (carbinoxamine) 6mg  tablet 3-4 times daily as needed and Astelin (azelastine) 2 sprays per nostril 1-2 times daily as needed - You can use an extra dose of the antihistamine, if needed, for breakthrough symptoms.  - Consider nasal saline rinses 1-2 times daily to remove allergens from the nasal cavities as well as help with mucous clearance (this is especially helpful to do before the nasal sprays are given) - Allergy shot consent signed. - Make an appointment in 2-3 weeks for your first injections.   2. Mild intermittent asthma, uncomplicated -Lung testing looked great today. -I think we can continue with albuterol 2 to 4 puffs every 4-6 hours as needed. -Once we have your allergies under better control, you should be using her albuterol less. -At this point, we will hold off on starting a controller medication.  3. Anaphylactic shock due to food (seafood) -Testing was positive today to shellfish mix and fish mix as well as multiple individual fish and shellfish types. -Anaphylaxis management plan provided. -Continue to avoid seafood. -EpiPen training and prescription provided.  4. Return in about 3 months (around 07/01/2018).   Please inform us of any Emergency Department visits, hospitalizations, or changes in symptoms. Call us before going to the ED for breathing or allergy symptoms since we might be able to fit you in for a sick visit. Feel free to contact us anytime with any questions, problems, or concerns.  It was a pleasure to meet you today!  Websites that have reliable patient information: 1. American Academy of Asthma, Allergy, and  Immunology: www.aaaai.org 2. Food Allergy Research and Education (FARE): foodallergy.org 3. Mothers of Asthmatics: http://www.asthmacommunitynetwork.org 4. American College of Allergy, Asthma, and Immunology: MissingWeapons.ca   Make sure you are registered to vote! If you have moved or changed any of your contact information, you will need to get this updated before voting!    Voter ID laws are going into effect for the General Election in November 2020! Be prepared! Check out LandscapingDigest.dk for more details.      Reducing Pollen Exposure  The American Academy of Allergy, Asthma and Immunology suggests the following steps to reduce your exposure to pollen during allergy seasons.    1. Do not hang sheets or clothing out to dry; pollen may collect on these items. 2. Do not mow lawns or spend time around freshly cut grass; mowing stirs up pollen. 3. Keep windows closed at night.  Keep car windows closed while driving. 4. Minimize morning activities outdoors, a time when pollen counts are usually at their highest. 5. Stay indoors as much as possible when pollen counts or humidity is high and on windy days when pollen tends to remain in the air longer. 6. Use air conditioning when possible.  Many air conditioners have filters that trap the pollen spores. 7. Use a HEPA room air filter to remove pollen form the indoor air you breathe.  Control of Mold Allergen   Mold and fungi can grow on a variety of surfaces provided certain temperature and moisture conditions exist.  Outdoor molds grow on plants, decaying vegetation and soil.  The major outdoor mold, Alternaria and Cladosporium, are found in very high numbers during hot and  dry conditions.  Generally, a late Summer - Fall peak is seen for common outdoor fungal spores.  Rain will temporarily lower outdoor mold spore count, but counts rise rapidly when the rainy period ends.  The most important indoor molds are Aspergillus and  Penicillium.  Dark, humid and poorly ventilated basements are ideal sites for mold growth.  The next most common sites of mold growth are the bathroom and the kitchen.  Outdoor (Seasonal) Mold Control 1. Use air conditioning and keep windows closed 2. Avoid exposure to decaying vegetation. 3. Avoid leaf raking. 4. Avoid grain handling. 5. Consider wearing a face mask if working in moldy areas.  6.   Indoor (Perennial) Mold Control   1. Maintain humidity below 50%. 2. Clean washable surfaces with 5% bleach solution. 3. Remove sources e.g. contaminated carpets.     Control of House Dust Mite Allergen    House dust mites play a major role in allergic asthma and rhinitis.  They occur in environments with high humidity wherever human skin, the food for dust mites is found. High levels have been detected in dust obtained from mattresses, pillows, carpets, upholstered furniture, bed covers, clothes and soft toys.  The principal allergen of the house dust mite is found in its feces.  A gram of dust may contain 1,000 mites and 250,000 fecal particles.  Mite antigen is easily measured in the air during house cleaning activities.    1. Encase mattresses, including the box spring, and pillow, in an air tight cover.  Seal the zipper end of the encased mattresses with wide adhesive tape. 2. Wash the bedding in water of 130 degrees Farenheit weekly.  Avoid cotton comforters/quilts and flannel bedding: the most ideal bed covering is the dacron comforter. 3. Remove all upholstered furniture from the bedroom. 4. Remove carpets, carpet padding, rugs, and non-washable window drapes from the bedroom.  Wash drapes weekly or use plastic window coverings. 5. Remove all non-washable stuffed toys from the bedroom.  Wash stuffed toys weekly. 6. Have the room cleaned frequently with a vacuum cleaner and a damp dust-mop.  The patient should not be in a room which is being cleaned and should wait 1 hour after  cleaning before going into the room. 7. Close and seal all heating outlets in the bedroom.  Otherwise, the room will become filled with dust-laden air.  An electric heater can be used to heat the room. 8. Reduce indoor humidity to less than 50%.  Do not use a humidifier.  Control of Cockroach Allergen  Cockroach allergen has been identified as an important cause of acute attacks of asthma, especially in urban settings.  There are fifty-five species of cockroach that exist in the [place], however only three, the [place], [place] and Oriental species produce allergen that can affect patients with Asthma.  Allergens can be obtained from fecal particles, egg casings and secretions from cockroaches.    1. Remove food sources. 2. Reduce access to water. 3. Seal access and entry points. 4. Spray runways with 0.5-1% Diazinon or Chlorpyrifos 5. Blow boric acid power under stoves and refrigerator. 6. Place bait stations (hydramethylnon) at feeding sites.  Control of Dog or Cat Allergen  Avoidance is the best way to manage a dog or cat allergy. If you have a dog or cat and are allergic to dog or cats, consider removing the dog or cat from the home. If you have a dog or cat but don't want to find it a new home,  or if your family wants a pet even though someone in the household is allergic, here are some strategies that may help keep symptoms at bay:  1. Keep the pet out of your bedroom and restrict it to only a few rooms. Be advised that keeping the dog or cat in only one room will not limit the allergens to that room. 2. Don't pet, hug or kiss the dog or cat; if you do, wash your hands with soap and water. 3. High-efficiency particulate air (HEPA) cleaners run continuously in a bedroom or living room can reduce allergen levels over time. 4. Regular use of a high-efficiency vacuum cleaner or a central vacuum can reduce allergen levels. 5. Giving your dog or cat a bath at least once a week can  reduce airborne allergen.

## 2018-04-01 NOTE — Progress Notes (Signed)
NEW PATIENT  Date of Service/Encounter:  04/01/18  Referring provider: Doristine Bosworth, MD   Assessment:   Seasonal and perennial allergic rhinitis (tobacco, trees, weeds, grasses, indoor molds, outdoor molds, dust mites, dog, and cockroach)   Mild intermittent asthma, uncomplicated  Anaphylactic shock due to food (seafood)  Plan/Recommendations:   1. Seasonal and perennial allergic rhinitis - Testing today showed: tobacco, trees, weeds, grasses, indoor molds, outdoor molds, dust mites, dog, and cockroach - Copy of test results provided.  - Avoidance measures provided. - Stop taking: Claritin - Continue with: Nasacort (triamcinolone) one spray per nostril daily - Start taking: Ryvent (carbinoxamine) 6mg  tablet 3-4 times daily as needed and Astelin (azelastine) 2 sprays per nostril 1-2 times daily as needed - You can use an extra dose of the antihistamine, if needed, for breakthrough symptoms.  - Consider nasal saline rinses 1-2 times daily to remove allergens from the nasal cavities as well as help with mucous clearance (this is especially helpful to do before the nasal sprays are given) - Allergy shot consent signed. - Make an appointment in 2-3 weeks for your first injections.   2. Mild intermittent asthma, uncomplicated -Lung testing looked great today. -I think we can continue with albuterol 2 to 4 puffs every 4-6 hours as needed. -Once we have your allergies under better control, you should be using her albuterol less. -At this point, we will hold off on starting a controller medication.  3. Anaphylactic shock due to food (seafood) -Testing was positive today to shellfish mix and fish mix as well as multiple individual fish and shellfish types. -Anaphylaxis management plan provided. -Continue to avoid seafood. -EpiPen training and prescription provided.  4. Return in about 3 months (around 07/01/2018).   Subjective:   Monica Hernandez is a 43 y.o. female  presenting today for evaluation of  Chief Complaint  Patient presents with  . Allergies  . Itchy Eye    Monica Hernandez has a history of the following: Patient Active Problem List   Diagnosis Date Noted  . Benign paroxysmal positional vertigo due to bilateral vestibular disorder 02/15/2018  . Chronic allergic rhinitis 02/15/2018  . Morbid obesity (HCC) 03/31/2017    History obtained from: chart review and patient.  Monica Hernandez was referred by Doristine Bosworth, MD.     Monica Hernandez is a 42 y.o. female presenting to establish care for allergic rhinitis. She is from [place] and was getting allergy shots when she lived there. She moved to [place] five years ago. She was born and raised there. She actually did fine until the last year or so after moving here.   Asthma/Respiratory Symptom History: She uses albuterol twice daily. This was not the case when she was on the allergy shots. She has never been on a controller medication. She has never been hospitalized for her asthma.   Allergic Rhinitis Symptom History: She was on shots when she lived in [place]. She was on 5-6 years. She stopped then when she moved here because her PCP wanted to try OTC medications. She was on Claritin, Zyrtec, Flonase, and Xyzal. Xyzal caused migraines with crying, and she stopped those immediately. Since she had the bariatric surgery in January 2019, her allergies have gone "crazy". She did move from a previous home that was "infested with mold". They were living there for three years with progressively worsening symptoms. They were renting the place at the time and when the contractors went out there to fix the  house, it was discovered that they had black mold in the walls. They then purchased a home and had a thorough inspection. However she continues to have itchy watery eyes and sneezing with sinus pressure.   Food Allergy Symptom History: She has a history of a seafood allergy. She reports itching and  breathing problems when she is around any kind of shellfish. She does not have an EpiPen. She was told that she can just use Benadryl instead as needed.   She lost 74 pounds since her surgery in January 2019. She underwent gastric bypass. Otherwise, there is no history of other atopic diseases, including drug allergies, stinging insect allergies, eczema or urticaria. There is no significant infectious history. Vaccinations are up to date.    Past Medical History: Patient Active Problem List   Diagnosis Date Noted  . Benign paroxysmal positional vertigo due to bilateral vestibular disorder 02/15/2018  . Chronic allergic rhinitis 02/15/2018  . Morbid obesity (HCC) 03/31/2017    Medication List:  Allergies as of 04/01/2018      Reactions   Contrave [naltrexone-bupropion Hcl Er] Nausea And Vomiting   Pt states it causes bloody stool, bloody vomit, and heart palpitations   Erythromycin Nausea And Vomiting, Rash   Wheezing    Shellfish Allergy Anaphylaxis   Zithromax [azithromycin] Nausea And Vomiting, Rash, Other (See Comments)   Belviq [lorcaserin Hcl] Other (See Comments)   Causes changes in vision and respiratory issues   Qsymia [phentermine-topiramate] Other (See Comments)   Causes change in vision and respiratory issues    Saxenda [liraglutide -weight Management] Other (See Comments)   Causes fainting   Allegra-d [fexofenadine-pseudoephed Er] Nausea And Vomiting   Levocetirizine Other (See Comments)   headache   Naltrexone Other (See Comments)   Bloody stools.    Fish Allergy Rash   Sulfa Antibiotics Rash      Medication List       Accurate as of April 01, 2018 11:59 PM. Always use your most recent med list.        acetaminophen 500 MG tablet Commonly known as:  TYLENOL Take 1,000 mg by mouth every 6 (six) hours as needed for mild pain or moderate pain.   albuterol 108 (90 Base) MCG/ACT inhaler Commonly known as:  PROVENTIL HFA;VENTOLIN HFA Inhale into the lungs  every 6 (six) hours as needed for wheezing or shortness of breath.   azelastine 0.1 % nasal spray Commonly known as:  ASTELIN Place 2 sprays into both nostrils 2 (two) times daily as needed for rhinitis.   BARIATRIC MULTIVITAMINS/IRON PO Take 2 tablets by mouth 2 (two) times daily. Chewable tablets   CALCIUM/VITAMIN D3/ADULT GUMMY 250-100-500 MG-MG-UNIT Chew Generic drug:  Calcium-Phosphorus-Vitamin D Chew 2 tablets by mouth 3 (three) times daily.   Carbinoxamine Maleate 6 MG Tabs Commonly known as:  RYVENT Take 6 mg by mouth as needed.   Cholecalciferol 50 MCG (2000 UT) Caps Take 2,000 Units by mouth daily.   dicyclomine 10 MG capsule Commonly known as:  BENTYL Take 1 capsule (10 mg total) by mouth 3 (three) times daily before meals.   EPINEPHrine 0.3 mg/0.3 mL Soaj injection Commonly known as:  EPIPEN 2-PAK Inject 0.3 mLs (0.3 mg total) into the muscle once for 1 dose.   lansoprazole 30 MG capsule Commonly known as:  PREVACID Take 1 capsule (30 mg total) by mouth daily as needed.   meclizine 12.5 MG tablet Commonly known as:  ANTIVERT Take 1 tablet (12.5 mg total) by mouth 3 (  three) times daily as needed for dizziness.   ondansetron 4 MG disintegrating tablet Commonly known as:  ZOFRAN ODT Take 1 tablet (4 mg total) by mouth every 8 (eight) hours as needed for nausea or vomiting.       Birth History: non-contributory  Developmental History: non-contributory.   Past Surgical History: Past Surgical History:  Procedure Laterality Date  . ABDOMINAL HYSTERECTOMY    . ADENOIDECTOMY    . ADENOIDECTOMY, TONSILLECTOMY AND MYRINGOTOMY WITH TUBE PLACEMENT    . CESAREAN SECTION    . FRACTURE SURGERY    . GASTRIC ROUX-EN-Y N/A 03/31/2017   Procedure: LAPAROSCOPIC ROUX-EN-Y GASTRIC BYPASS WITH UPPER ENDOSCOPY;  Surgeon: Sheliah Hatch, De Blanch, MD;  Location: [facility];  Service: General;  Laterality: N/A;  . LEG SURGERY Left   . TONSILLECTOMY    . TUBAL LIGATION        Family History: Family History  Problem Relation Age of Onset  . Hypertension Mother   . Stroke Mother   . Thyroid cancer Mother   . Stroke Father   . Lung cancer Father   . Brain cancer Father   . Bone cancer Father   . Hypertension Sister   . Heart disease Maternal Grandmother   . Diabetes Maternal Grandmother   . Asthma Other   . Cancer Other   . Stomach cancer Neg Hx   . Colon cancer Neg Hx      Social History: Monica Hernandez lives at home with her family.  They now live in [place] in a house with a backyard that is ideal for the dogs.  There is some smoke exposure.    Review of Systems: a 14-point review of systems is pertinent for what is mentioned in HPI.  Otherwise, all other systems were negative. Constitutional: negative other than that listed in the HPI Eyes: negative other than that listed in the HPI Ears, nose, mouth, throat, and face: negative other than that listed in the HPI Respiratory: negative other than that listed in the HPI Cardiovascular: negative other than that listed in the HPI Gastrointestinal: negative other than that listed in the HPI Genitourinary: negative other than that listed in the HPI Integument: negative other than that listed in the HPI Hematologic: negative other than that listed in the HPI Musculoskeletal: negative other than that listed in the HPI Neurological: negative other than that listed in the HPI Allergy/Immunologic: negative other than that listed in the HPI    Objective:   Blood pressure 132/82, pulse 82, temperature 98.3 F (36.8 C), temperature source Oral, resp. rate 16, height 5\' 4"  (1.626 m), weight 249 lb (112.9 kg), SpO2 97 %. Body mass index is 42.74 kg/m.   Physical Exam:  General: Alert, interactive, in no acute distress. Smiling and pleasant female.  Eyes: No conjunctival injection bilaterally, no discharge on the right, no discharge on the left and no Horner-Trantas dots present. PERRL bilaterally.  EOMI without pain. No photophobia.  Ears: Right TM pearly gray with normal light reflex, Left TM pearly gray with normal light reflex, Right TM intact without perforation and Left TM intact without perforation.  Nose/Throat: External nose within normal limits and septum midline. Turbinates markedly edematous with clear discharge. Posterior oropharynx markedly erythematous with cobblestoning in the posterior oropharynx. Tonsils 2+ without exudates.  Tongue without thrush. Neck: Supple without thyromegaly. Trachea midline. Adenopathy: no enlarged lymph nodes appreciated in the anterior cervical, occipital, axillary, epitrochlear, inguinal, or popliteal regions. Lungs: Clear to auscultation without wheezing, rhonchi or rales. No  increased work of breathing. CV: Normal S1/S2. No murmurs. Capillary refill <2 seconds.  Abdomen: Nondistended, nontender. No guarding or rebound tenderness. Bowel sounds present in all fields and hyperactive  Skin: Warm and dry, without lesions or rashes. Extremities:  No clubbing, cyanosis or edema. Neuro:   Grossly intact. No focal deficits appreciated. Responsive to questions.  Diagnostic studies:   Spirometry: results normal (FEV1: 3.57/121%, FVC: 4.48/122%, FEV1/FVC: 80%).    Spirometry consistent with normal pattern.   Allergy Studies:   Airborne Adult Perc - 04/04/18 1429    Time Antigen Placed  1420    Allergen Manufacturer  Waynette Buttery    Location  Back    Number of Test  59    Panel 1  Select    1. Control-Buffer 50% Glycerol  Negative    2. Control-Histamine 1 mg/ml  2+    3. Albumin saline  Negative    4. Bahia  Negative    5. [place]  Negative    6. Johnson  Negative    7. Kentucky Blue  Negative    8. Meadow Fescue  3+    9. Perennial Rye  3+    10. Sweet Vernal  3+    11. Timothy  2+    12. Cocklebur  Negative    13. Burweed Marshelder  Negative    14. Ragweed, short  2+    15. Ragweed, Giant  2+    16. Plantain,  English  Negative    17.  Lamb's Quarters  Negative    18. Sheep Sorrell  Negative    19. Rough Pigweed  Negative    20. Marsh Elder, Rough  2+    21. Mugwort, Common  Negative    22. Ash mix  Negative    23. Birch mix  3+    24. Beech American  Negative    25. Box, Elder  2+    26. Cedar, red  Negative    27. Cottonwood, [place]  Negative    28. Elm mix  2+    29. Hickory mix  2+    30. Maple mix  2+    31. Oak, [place] mix  Negative    32. Pecan Pollen  Negative    33. Pine mix  Negative    34. Sycamore Eastern  Negative    35. Walnut, Black Pollen  Negative    36. Alternaria alternata  2+    37. Cladosporium Herbarum  2+    38. Aspergillus mix  2+    39. Penicillium mix  2+    40. Bipolaris sorokiniana (Helminthosporium)  2+    41. Drechslera spicifera (Curvularia)  2+    42. Mucor plumbeus  2+    43. Fusarium moniliforme  Negative    44. Aureobasidium pullulans (pullulara)  2+    45. Rhizopus oryzae  Negative    46. Botrytis cinera  2+    47. Epicoccum nigrum  Negative    48. Phoma betae  Negative    49. Candida Albicans  Negative    50. Trichophyton mentagrophytes  2+    51. Mite, D Farinae  5,000 AU/ml  2+    52. Mite, D Pteronyssinus  5,000 AU/ml  2+    53. Cat Hair 10,000 BAU/ml  Negative    54.  Dog Epithelia  2+    55. Mixed Feathers  Negative    56. Horse Epithelia  Negative    57. Cockroach, [place]  2+  58. Mouse  Negative    59. Tobacco Leaf  2+     Food Adult Perc - 04/01/18 1400    Time Antigen Placed  1420    Allergen Manufacturer  Waynette Buttery    Location  Back    Number of allergen test  14    8. Shellfish Mix  --   +/-   9. Fish Mix  --   +/-   18. Catfish  Negative    19. Bass  --   +/-   20. Trout  Negative    21. Tuna  Negative    22. Salmon  Negative    23. Flounder  Negative    24. Codfish  Negative    25. Shrimp  Negative    26. Crab  --   +/-   27. Lobster  Negative    28. Oyster  Negative    29. Scallops  --   +/-       Allergy testing results were  read and interpreted by myself, documented by clinical staff.       Malachi Bonds, MD [facility]

## 2018-04-02 ENCOUNTER — Ambulatory Visit: Payer: 59 | Admitting: Allergy & Immunology

## 2018-04-02 ENCOUNTER — Encounter: Payer: Self-pay | Admitting: Allergy & Immunology

## 2018-04-02 LAB — CBC
HEMOGLOBIN: 12.7 g/dL (ref 11.1–15.9)
Hematocrit: 38.2 % (ref 34.0–46.6)
MCH: 29.4 pg (ref 26.6–33.0)
MCHC: 33.2 g/dL (ref 31.5–35.7)
MCV: 88 fL (ref 79–97)
Platelets: 264 10*3/uL (ref 150–450)
RBC: 4.32 x10E6/uL (ref 3.77–5.28)
RDW: 12.8 % (ref 11.7–15.4)
WBC: 8.4 10*3/uL (ref 3.4–10.8)

## 2018-04-02 LAB — VITAMIN B12: VITAMIN B 12: 827 pg/mL (ref 232–1245)

## 2018-04-02 LAB — HEMOGLOBIN A1C
ESTIMATED AVERAGE GLUCOSE: 117 mg/dL
Hgb A1c MFr Bld: 5.7 % — ABNORMAL HIGH (ref 4.8–5.6)

## 2018-04-02 LAB — TSH+FREE T4
FREE T4: 1.07 ng/dL (ref 0.82–1.77)
TSH: 1.76 u[IU]/mL (ref 0.450–4.500)

## 2018-04-02 LAB — VITAMIN D 25 HYDROXY (VIT D DEFICIENCY, FRACTURES): VIT D 25 HYDROXY: 39.1 ng/mL (ref 30.0–100.0)

## 2018-04-02 MED FILL — EPINEPHRINE 0.3 MG AUTO-INJ: 0.3 | 14 days supply | Qty: 2 | Fill #0

## 2018-04-02 MED FILL — AZELASTINE HCL 137 MCG SPRY: 0.1 | 25 days supply | Qty: 30 | Fill #0

## 2018-04-05 DIAGNOSIS — J301 Allergic rhinitis due to pollen: Secondary | ICD-10-CM | POA: Diagnosis not present

## 2018-04-05 MED ORDER — CARBINOXAMINE MALEATE 4 MG PO TABS: 4.0000 mg | ORAL_TABLET | Freq: Three times a day (TID) | ORAL | 3 refills | Status: DC | PRN

## 2018-04-05 MED FILL — CARBINOXAMINE MALEATE 4 MG: 4 | 10 days supply | Qty: 30 | Fill #0

## 2018-04-05 NOTE — Progress Notes (Addendum)
VIALS EXP 04-06-2019.  REPRINTED LABELS D/T LABEL NEEDED ADDITIONAL INGREDIENT.

## 2018-04-06 DIAGNOSIS — J3089 Other allergic rhinitis: Secondary | ICD-10-CM

## 2018-04-10 ENCOUNTER — Encounter: Payer: Self-pay | Admitting: Family Medicine

## 2018-04-10 NOTE — Telephone Encounter (Signed)
pls see note 

## 2018-04-22 ENCOUNTER — Ambulatory Visit (INDEPENDENT_AMBULATORY_CARE_PROVIDER_SITE_OTHER): Payer: 59 | Admitting: *Deleted

## 2018-04-22 DIAGNOSIS — J309 Allergic rhinitis, unspecified: Secondary | ICD-10-CM | POA: Diagnosis not present

## 2018-04-22 NOTE — Progress Notes (Signed)
Immunotherapy   Patient Details  Name: Monica Hernandez MRN: [id number] Date of Birth: 04-14-1975  04/22/2018  Monica Hernandez started injections for  Mold-Cockroach-Dmite & Grass-Weed-Tree-Dog. Following schedule: B  Frequency:2 times per week Epi-Pen:Epi-Pen Available  Consent signed and patient instructions given. No problems after 30 minutes in the office.   Mariane Duval 04/22/2018, 11:30 AM

## 2018-04-27 ENCOUNTER — Ambulatory Visit (INDEPENDENT_AMBULATORY_CARE_PROVIDER_SITE_OTHER): Payer: 59 | Admitting: *Deleted

## 2018-04-27 DIAGNOSIS — J309 Allergic rhinitis, unspecified: Secondary | ICD-10-CM | POA: Diagnosis not present

## 2018-04-28 MED FILL — CARBINOXAMINE MALEATE 4 MG: 4 | 10 days supply | Qty: 30 | Fill #1

## 2018-05-04 ENCOUNTER — Ambulatory Visit (INDEPENDENT_AMBULATORY_CARE_PROVIDER_SITE_OTHER): Payer: 59 | Admitting: *Deleted

## 2018-05-04 DIAGNOSIS — J309 Allergic rhinitis, unspecified: Secondary | ICD-10-CM | POA: Diagnosis not present

## 2018-05-13 ENCOUNTER — Emergency Department (HOSPITAL_COMMUNITY): Payer: 59

## 2018-05-13 ENCOUNTER — Encounter (HOSPITAL_COMMUNITY): Payer: Self-pay | Admitting: Emergency Medicine

## 2018-05-13 ENCOUNTER — Other Ambulatory Visit: Payer: Self-pay

## 2018-05-13 ENCOUNTER — Emergency Department (HOSPITAL_COMMUNITY)
Admission: EM | Admit: 2018-05-13 | Discharge: 2018-05-13 | Disposition: A | Discharge status: Home / Self Care | Payer: 59 | Attending: Emergency Medicine | Admitting: Emergency Medicine

## 2018-05-13 DIAGNOSIS — Z79899 Other long term (current) drug therapy: Secondary | ICD-10-CM | POA: Diagnosis not present

## 2018-05-13 DIAGNOSIS — I10 Essential (primary) hypertension: Secondary | ICD-10-CM | POA: Diagnosis not present

## 2018-05-13 DIAGNOSIS — J45909 Unspecified asthma, uncomplicated: Secondary | ICD-10-CM | POA: Insufficient documentation

## 2018-05-13 DIAGNOSIS — M545 Low back pain: Secondary | ICD-10-CM | POA: Diagnosis not present

## 2018-05-13 DIAGNOSIS — M5136 Other intervertebral disc degeneration, lumbar region: Secondary | ICD-10-CM | POA: Diagnosis not present

## 2018-05-13 DIAGNOSIS — M62830 Muscle spasm of back: Secondary | ICD-10-CM | POA: Insufficient documentation

## 2018-05-13 DIAGNOSIS — Z87891 Personal history of nicotine dependence: Secondary | ICD-10-CM | POA: Insufficient documentation

## 2018-05-13 MED ORDER — CYCLOBENZAPRINE HCL 10 MG PO TABS: 10.0000 mg | ORAL_TABLET | Freq: Three times a day (TID) | ORAL | 0 refills | Status: DC

## 2018-05-13 MED ORDER — CYCLOBENZAPRINE HCL 10 MG PO TABS
10.0000 mg | ORAL_TABLET | Freq: Once | ORAL | Status: AC
  Administered 2018-05-13: 10 mg via ORAL
  Filled 2018-05-13: qty 1

## 2018-05-13 MED ORDER — ACETAMINOPHEN 500 MG PO TABS
1000.0000 mg | ORAL_TABLET | Freq: Once | ORAL | Status: AC
  Administered 2018-05-13: 1000 mg via ORAL
  Filled 2018-05-13: qty 2
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
## 2018-05-13 NOTE — Discharge Instructions (Addendum)
Please use a heating pad to your back to assist with your discomfort.  Please use Tylenol extra strength every 4 hours over the next few days.  Use Flexeril 3 times daily for spasm pain. This medication may cause drowsiness. Please do not drink, drive, or participate in activity that requires concentration while taking this medication.  Please see Dr. Creta Levin, or Dr. Romeo Apple for orthopedic evaluation if not improving.
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
## 2018-05-18 ENCOUNTER — Ambulatory Visit (INDEPENDENT_AMBULATORY_CARE_PROVIDER_SITE_OTHER): Payer: 59 | Admitting: *Deleted

## 2018-05-18 DIAGNOSIS — J309 Allergic rhinitis, unspecified: Secondary | ICD-10-CM

## 2018-05-20 ENCOUNTER — Ambulatory Visit (INDEPENDENT_AMBULATORY_CARE_PROVIDER_SITE_OTHER): Payer: 59 | Admitting: *Deleted

## 2018-05-20 DIAGNOSIS — J309 Allergic rhinitis, unspecified: Secondary | ICD-10-CM

## 2018-05-25 ENCOUNTER — Ambulatory Visit (INDEPENDENT_AMBULATORY_CARE_PROVIDER_SITE_OTHER): Payer: 59 | Admitting: *Deleted

## 2018-05-25 DIAGNOSIS — J309 Allergic rhinitis, unspecified: Secondary | ICD-10-CM | POA: Diagnosis not present

## 2018-05-25 NOTE — Progress Notes (Signed)
Patient waited in office 45 min was complaining of itchy eyes took Benadryl prior to allergy shot. She said she was fine to go home had Epipen with her and would call or come back. Writer called patient states she is still having itchy eyes she got home recently and placed eye drops in eyes with no relief and now has sore throat. Advised patient to take 50 mg of Benadryl per Emergency Action Plan. Patient does have Epipen and will call our on call doctor if she develops any problems writer will call tomorrow to follow up. Patient verbalized understanding and was advised to only come in once weekly as she had a reaction last week as well.

## 2018-05-27 ENCOUNTER — Telehealth: Payer: Self-pay | Admitting: *Deleted

## 2018-05-27 NOTE — Telephone Encounter (Signed)
Called patient states she did not have any more problems from allergy shots on Tuesday

## 2018-05-28 DIAGNOSIS — S335XXA Sprain of ligaments of lumbar spine, initial encounter: Secondary | ICD-10-CM | POA: Diagnosis not present

## 2018-05-29 DIAGNOSIS — M545 Low back pain: Secondary | ICD-10-CM | POA: Diagnosis not present

## 2018-05-31 MED FILL — CARBINOXAMINE MALEATE 4 MG: 4 | 10 days supply | Qty: 30 | Fill #2 | Status: TO

## 2018-06-01 DIAGNOSIS — M545 Low back pain: Secondary | ICD-10-CM | POA: Diagnosis not present

## 2018-07-01 MED FILL — DICYCLOMINE 10 MG CAPSULE: 10 | 90 days supply | Qty: 270 | Fill #0

## 2018-07-01 MED FILL — CARBINOXAMINE MALEATE 4 MG: 4 | 10 days supply | Qty: 30 | Fill #0

## 2018-07-01 MED FILL — LANSOPRAZOLE 30 MG CPDR: 30 | 90 days supply | Qty: 90 | Fill #0

## 2018-07-06 ENCOUNTER — Ambulatory Visit: Payer: 59 | Admitting: Allergy & Immunology

## 2018-07-15 ENCOUNTER — Telehealth: Payer: 59 | Admitting: Physician Assistant

## 2018-07-15 DIAGNOSIS — J029 Acute pharyngitis, unspecified: Secondary | ICD-10-CM | POA: Diagnosis not present

## 2018-07-15 MED ORDER — AMOXICILLIN 875 MG PO TABS: 875.0000 mg | ORAL_TABLET | Freq: Two times a day (BID) | ORAL | 0 refills | Status: DC

## 2018-07-15 NOTE — Progress Notes (Signed)
We are sorry that you are not feeling well.  Here is how we plan to help!  Based on what you have shared with me it is likely that you have strep pharyngitis.  Strep pharyngitis is inflammation and infection in the back of the throat.  This is an infection cause by bacteria and is treated with antibiotics.  I have prescribed Amoxicillin 875 mg twice daily x 10 days. For throat pain, we recommend over the counter oral pain relief medications such as acetaminophen or aspirin, or anti-inflammatory medications such as ibuprofen or naproxen sodium. Topical treatments such as oral throat lozenges or sprays may be used as needed. Strep infections are not as easily transmitted as other respiratory infections, however we still recommend that you avoid close contact with loved ones, especially the very young and elderly.  Remember to wash your hands thoroughly throughout the day as this is the number one way to prevent the spread of infection and wipe down door knobs and counters with disinfectant.   Home Care:  Only take medications as instructed by your medical team.  Complete the entire course of an antibiotic.  Do not take these medications with alcohol.  A steam or ultrasonic humidifier can help congestion.  You can place a towel over your head and breathe in the steam from hot water coming from a faucet.  Avoid close contacts especially the very young and the elderly.  Cover your mouth when you cough or sneeze.  Always remember to wash your hands.  Get Help Right Away If:  You develop worsening fever or sinus pain.  You develop a severe head ache or visual changes.  Your symptoms persist after you have completed your treatment plan.  Make sure you  Understand these instructions.  Will watch your condition.  Will get help right away if you are not doing well or get worse.  Your e-visit answers were reviewed by a board certified advanced clinical practitioner to complete your personal  care plan.  Depending on the condition, your plan could have included both over the counter or prescription medications.  If there is a problem please reply  once you have received a response from your provider.  Your safety is important to [place].  If you have drug allergies check your prescription carefully.    You can use MyChart to ask questions about today's visit, request a non-urgent call back, or ask for a work or school excuse for 24 hours related to this e-Visit. If it has been greater than 24 hours you will need to follow up with your provider, or enter a new e-Visit to address those concerns.  You will get an e-mail in the next two days asking about your experience.  I hope that your e-visit has been valuable and will speed your recovery. Thank you for using e-visits.

## 2018-07-15 NOTE — Progress Notes (Signed)
I have spent 5 minutes in review of e-visit questionnaire, review and updating patient chart, medical decision making and response to patient.   Tatym Schermer Cody Alegandra Sommers, PA-C    

## 2018-07-25 ENCOUNTER — Encounter: Payer: Self-pay | Admitting: Family Medicine

## 2018-07-28 ENCOUNTER — Encounter: Payer: Self-pay | Admitting: Allergy & Immunology

## 2018-07-28 ENCOUNTER — Other Ambulatory Visit: Payer: Self-pay | Admitting: *Deleted

## 2018-07-28 DIAGNOSIS — Z03818 Encounter for observation for suspected exposure to other biological agents ruled out: Secondary | ICD-10-CM | POA: Diagnosis not present

## 2018-07-28 MED ORDER — CARBINOXAMINE MALEATE 4 MG PO TABS: 4.0000 mg | ORAL_TABLET | Freq: Three times a day (TID) | ORAL | 5 refills | Status: DC | PRN

## 2018-07-28 MED FILL — CARBINOXAMINE MALEATE 4 MG: 4 | 10 days supply | Qty: 30 | Fill #0

## 2018-08-27 ENCOUNTER — Ambulatory Visit: Payer: 59 | Admitting: Family Medicine

## 2018-08-31 ENCOUNTER — Encounter: Payer: Self-pay | Admitting: Family Medicine

## 2018-09-02 ENCOUNTER — Ambulatory Visit (HOSPITAL_COMMUNITY)
Admission: EM | Admit: 2018-09-02 | Discharge: 2018-09-02 | Disposition: A | Discharge status: Home / Self Care | Payer: 59 | Attending: Emergency Medicine | Admitting: Emergency Medicine

## 2018-09-02 ENCOUNTER — Other Ambulatory Visit: Payer: Self-pay

## 2018-09-02 ENCOUNTER — Encounter (HOSPITAL_COMMUNITY): Payer: Self-pay

## 2018-09-02 DIAGNOSIS — M26622 Arthralgia of left temporomandibular joint: Secondary | ICD-10-CM | POA: Diagnosis not present

## 2018-09-02 DIAGNOSIS — J3089 Other allergic rhinitis: Secondary | ICD-10-CM

## 2018-09-02 DIAGNOSIS — R51 Headache: Secondary | ICD-10-CM

## 2018-09-02 DIAGNOSIS — H1013 Acute atopic conjunctivitis, bilateral: Secondary | ICD-10-CM | POA: Diagnosis not present

## 2018-09-02 DIAGNOSIS — R519 Headache, unspecified: Secondary | ICD-10-CM

## 2018-09-02 MED ORDER — ARTIFICIAL TEARS OPHTHALMIC OINT: TOPICAL_OINTMENT | Freq: Every day | OPHTHALMIC | 0 refills | Status: DC

## 2018-09-02 MED ORDER — TETRACAINE HCL 0.5 % OP SOLN
OPHTHALMIC | Status: AC
  Filled 2018-09-02: qty 4

## 2018-09-02 MED ORDER — CYCLOBENZAPRINE HCL 10 MG PO TABS: 10.0000 mg | ORAL_TABLET | Freq: Every day | ORAL | 0 refills | Status: DC

## 2018-09-02 MED ORDER — OLOPATADINE HCL 0.1 % OP SOLN: 1.0000 [drp] | Freq: Two times a day (BID) | OPHTHALMIC | 0 refills | Status: DC

## 2018-09-02 MED ORDER — TETRACAINE HCL 0.5 % OP SOLN
1.0000 [drp] | Freq: Once | OPHTHALMIC | Status: AC
  Administered 2018-09-02: 1 [drp] via OPHTHALMIC

## 2018-09-02 MED ORDER — BUTALBITAL-APAP-CAFFEINE 50-325-40 MG PO TABS: 1.0000 | ORAL_TABLET | ORAL | 0 refills | Status: DC | PRN

## 2018-09-02 MED ORDER — PREDNISONE 20 MG PO TABS: 40.0000 mg | ORAL_TABLET | Freq: Every day | ORAL | 0 refills | Status: AC

## 2018-09-02 MED ORDER — FLUORESCEIN SODIUM 1 MG OP STRP
ORAL_STRIP | OPHTHALMIC | Status: AC
  Filled 2018-09-02: qty 1

## 2018-09-02 NOTE — ED Triage Notes (Signed)
Patient presents to Urgent Care with complaints of intermittent migraines since about a month ago. Patient reports she thought her allergy medicine was causing it, but she stopped taking that and her migraines are returning. Pt endorses bilateral eye burning and irritation, is not able to put her contacts in and her eyes are sensitive to the light.

## 2018-09-02 NOTE — ED Notes (Signed)
Patient's medicines went to her preferred pharmacy as requested.  That pharmacy is closed.  Offered to resend to a different pharmacy for pick up tonight, patient declined

## 2018-09-02 NOTE — ED Provider Notes (Signed)
HPI  SUBJECTIVE:  Monica Hernandez is a 43 y.o. female who reports 1 month of worsening gradual onset left-sided throbbing, pounding headaches located behind and above her left eye, radiating into her left ear.  She states that the headaches can last for days.  States that they have been daily over the past week.  She has known triggers, most recently hand sanitizer.  She reports photophobia, phonophobia, occasional sinus pain/ pressure.  She reports left eye pain and bilateral eye redness during this time, was switched from monthly to daily contacts and started on OTC allergy eyedrops which helped her eye redness and discharge.  It has not affected the eye pain.  She also reports intermittently blurry vision.  States that she has been unable to wear contacts for 3 days.  She denies nasal congestion, purulent nasal drainage, rhinorrhea, ear pain, fevers, neck stiffness, rash, dysarthria, arm or leg weakness, facial droop. She reports that she grinds her teeth at night.  She denies dental pain, has upper dentures She has had a history of cluster headaches which were thought to be due to severe allergies, she was taking carbinoxamine which was helping her allergies and headaches, but tapered her self off of it over the past month due to its side effects.  States that her headaches have been getting worse since.  She has also been taking Tylenol which helps her headaches.  She also states that being in the dark helps.  Symptoms are worse with light, noise.  She has a past medical history of asthma, hypertension, severe allergies, cluster headaches that were determined to be due to allergies, she is status post gastric bypass.  Is unable to take NSAIDs.  No history of atrial fibrillation, stroke, diabetes, glaucoma.  Family history negative for aneurysm, stroke.  States that she went to the [facility] emergency department for severe headache several months ago, had a negative work-up including a normal head CT.   LMP: Status post hysterectomy.  PMD: [facility].   Past Medical History:  Diagnosis Date  . Allergy   . Anemia   . Asthma   . Eczema   . GERD (gastroesophageal reflux disease)   . History of kidney stones   . Hypertension   . Kidney stone   . Mitral valve prolapse    was told as a kid that she had this but during ECHO for eval, ECHO did not indicate this , see 08-25-16 epic result   . PONV (postoperative nausea and vomiting)    epidural with c section caused N/V   . Pre-diabetes   . Recurrent upper respiratory infection (URI)   . Ulcer     Past Surgical History:  Procedure Laterality Date  . ABDOMINAL HYSTERECTOMY    . ADENOIDECTOMY    . ADENOIDECTOMY, TONSILLECTOMY AND MYRINGOTOMY WITH TUBE PLACEMENT    . CESAREAN SECTION    . FRACTURE SURGERY    . GASTRIC ROUX-EN-Y N/A 03/31/2017   Procedure: LAPAROSCOPIC ROUX-EN-Y GASTRIC BYPASS WITH UPPER ENDOSCOPY;  Surgeon: Kieth Brightly, Arta Bruce, MD;  Location: [facility];  Service: General;  Laterality: N/A;  . LEG SURGERY Left   . TONSILLECTOMY    . TUBAL LIGATION      Family History  Problem Relation Age of Onset  . Hypertension Mother   . Stroke Mother   . Thyroid cancer Mother   . Stroke Father   . Lung cancer Father   . Brain cancer Father   . Bone cancer Father   .  Hypertension Sister   . Heart disease Maternal Grandmother   . Diabetes Maternal Grandmother   . Asthma Other   . Cancer Other   . Stomach cancer Neg Hx   . Colon cancer Neg Hx     Social History   Tobacco Use  . Smoking status: Former Smoker    Types: Cigarettes    Quit date: 2010    Years since quitting: 10.4  . Smokeless tobacco: Never Used  . Tobacco comment: smoked since age 43 ; quit in 2010   Substance Use Topics  . Alcohol use: No  . Drug use: No    No current facility-administered medications for this encounter.   Current Outpatient Medications:  .  acetaminophen (TYLENOL) 500 MG tablet, Take 1,000 mg by mouth every 6 (six)  hours as needed for mild pain or moderate pain., Disp: , Rfl:  .  albuterol (PROVENTIL HFA;VENTOLIN HFA) 108 (90 Base) MCG/ACT inhaler, Inhale into the lungs every 6 (six) hours as needed for wheezing or shortness of breath., Disp: , Rfl:  .  artificial tears (LACRILUBE) OINT ophthalmic ointment, Place into both eyes at bedtime., Disp: 3.5 g, Rfl: 0 .  azelastine (ASTELIN) 0.1 % nasal spray, Place 2 sprays into both nostrils 2 (two) times daily as needed for rhinitis., Disp: 30 mL, Rfl: 2 .  butalbital-acetaminophen-caffeine (FIORICET) 50-325-40 MG tablet, Take 1-2 tablets by mouth every 4 (four) hours as needed for headache. Max 6 caps/day, Disp: 20 tablet, Rfl: 0 .  Calcium-Phosphorus-Vitamin D (CALCIUM/VITAMIN D3/ADULT GUMMY) 250-100-500 MG-MG-UNIT CHEW, Chew 2 tablets by mouth 3 (three) times daily., Disp: , Rfl:  .  Cholecalciferol 2000 units CAPS, Take 2,000 Units by mouth daily., Disp: , Rfl:  .  cyclobenzaprine (FLEXERIL) 10 MG tablet, Take 1 tablet (10 mg total) by mouth at bedtime., Disp: 20 tablet, Rfl: 0 .  dicyclomine (BENTYL) 10 MG capsule, Take 1 capsule (10 mg total) by mouth 3 (three) times daily before meals. (Patient taking differently: Take 10 mg by mouth every morning. ), Disp: 270 capsule, Rfl: 3 .  lansoprazole (PREVACID) 30 MG capsule, Take 1 capsule (30 mg total) by mouth daily as needed., Disp: 90 capsule, Rfl: 0 .  Multiple Vitamins-Minerals (BARIATRIC MULTIVITAMINS/IRON PO), Take 2 tablets by mouth 2 (two) times daily. Chewable tablets, Disp: , Rfl:  .  olopatadine (PATANOL) 0.1 % ophthalmic solution, Place 1 drop into both eyes 2 (two) times daily., Disp: 5 mL, Rfl: 0 .  predniSONE (DELTASONE) 20 MG tablet, Take 2 tablets (40 mg total) by mouth daily with breakfast for 5 days., Disp: 10 tablet, Rfl: 0  Allergies  Allergen Reactions  . Contrave [Naltrexone-Bupropion Hcl Er] Nausea And Vomiting    Pt states it causes bloody stool, bloody vomit, and heart palpitations  .  Erythromycin Nausea And Vomiting and Rash    Wheezing   . Shellfish Allergy Anaphylaxis  . Zithromax [Azithromycin] Nausea And Vomiting, Rash and Other (See Comments)  . Belviq [Lorcaserin Hcl] Other (See Comments)    Causes changes in vision and respiratory issues  . Qsymia [Phentermine-Topiramate] Other (See Comments)    Causes change in vision and respiratory issues   . Saxenda [Liraglutide -Weight Management] Other (See Comments)    Causes fainting  . Allegra-D [Fexofenadine-Pseudoephed Er] Nausea And Vomiting  . Levocetirizine Other (See Comments)    headache  . Naltrexone Other (See Comments)    Bloody stools.   . Fish Allergy Rash  . Sulfa Antibiotics Rash  ROS  As noted in HPI.   Physical Exam  BP 132/81 (BP Location: Right Wrist)   Pulse 70   Temp 98.4 F (36.9 C) (Oral)   Resp 19   SpO2 100%   Constitutional: Well developed, well nourished, no acute distress Eyes: PERRL, EOMI, bilateral conjunctival injection.  Positive direct photophobia left eye.  No consensual photophobia.  No ulcer or abrasion seen on fluorescein exam bilaterally.  No hyphema.  No crusting, periorbital erythema.  Intraocular pressure left eye 13, intraocular pressure right eye at 9. HENT: Normocephalic, atraumatic,mucus membranes moist, upper dentures present.  Patient states she is edentulous along her upper palate.  TM normal b/l.  Positive left-sided TMJ tenderness.  No Crepitus.  No nasal congestion, normal turbinates.  Positive left frontal and maxillary sinus tenderness, patient states this is common with her headaches.  Neck: no cervical LN  - trapezial muscle tenderness. No meningismus Respiratory: normal inspiratory effort Cardiovascular: Normal rate GI:  nondistended skin: No rash, skin intact Musculoskeletal: No edema, no tenderness, no deformities Neurologic: Alert & oriented x 3, CN III-XII intact, romberg neg, finger-> nose, heel-> shin equal b/l, Romberg neg, tandem gait  steady Psychiatric: Speech and behavior appropriate   ED Course   Medications  tetracaine (PONTOCAINE) 0.5 % ophthalmic solution 1-2 drop (1 drop Both Eyes Given by Other 09/02/18 1740)  tetracaine (PONTOCAINE) 0.5 % ophthalmic solution (has no administration in time range)  fluorescein 1 MG ophthalmic strip (has no administration in time range)    No orders of the defined types were placed in this encounter.  No results found for this or any previous visit (from the past 24 hour(s)). No results found.   ED Clinical Impression  1. Nonintractable headache, unspecified chronicity pattern, unspecified headache type   2. Environmental and seasonal allergies   3. Allergic conjunctivitis of both eyes   4. Arthralgia of left temporomandibular joint     ED Assessment/Plan   She states vision clears completely after the tetracaine.  Suspect the eye redness is from allergic conjunctivitis.  No evidence of glaucoma other ocular pathology.  Will have her discontinue the OTC allergy eyedrops and try Patanol, Lacri-Lube.  She will need to follow-up with her eye doctor for other possible solutions.    Pt describing typical pain, no sudden onset. Doubt SAH, ICH or space occupying lesion. Pt without fevers/chills, Pt has no meningeal sx, no nuchal rigidity. Doubt meningitis. Pt with normal neuro exam, no evidence of CVA/TIA.  Pt BP not elevated significantly, doubt hypertensive emergency.  Doubt sinusitis, dental infection.  Suspect that her uncontrolled allergies are exacerbating her chronic cluster headaches.  She discontinued the carbinoxamine that was helping her headaches due to intolerable side effects. We will send her home with 1 g of Tylenol 4 times a day, no NSAIDs due to gastric bypass, Fioricet for severe headache.  she also has left TMJ tenderness, she states that she grinds her teeth, suspect this is exacerbating the headache.  Will send home with Flexeril at night and also 5-day 40 mg  of prednisone which should help with her allergies in addition to temporomandibular joint inflammation.  She will need to follow-up with her dentist for a bite guard.  She will also need to follow-up with her primary care physician or her allergist for reevaluation and to get her allergies under control.  Patient states that she has tried Claritin, Allegra, Zyrtec, Xyzal without relief.  [place] controlled substances registry for this patient consulted and  feel the risk/benefit ratio today is favorable for proceeding with this prescription for a controlled substance.  No opiate prescriptions since January 2019  Discussed MDM, plan for follow up, signs and sx that should prompt return to ER. Pt agrees with plan  Meds ordered this encounter  Medications  . tetracaine (PONTOCAINE) 0.5 % ophthalmic solution 1-2 drop  . cyclobenzaprine (FLEXERIL) 10 MG tablet    Sig: Take 1 tablet (10 mg total) by mouth at bedtime.    Dispense:  20 tablet    Refill:  0  . predniSONE (DELTASONE) 20 MG tablet    Sig: Take 2 tablets (40 mg total) by mouth daily with breakfast for 5 days.    Dispense:  10 tablet    Refill:  0  . DISCONTD: artificial tears (LACRILUBE) OINT ophthalmic ointment    Sig: Place into both eyes at bedtime.    Dispense:  3.5 g    Refill:  0  . olopatadine (PATANOL) 0.1 % ophthalmic solution    Sig: Place 1 drop into both eyes 2 (two) times daily.    Dispense:  5 mL    Refill:  0  . artificial tears (LACRILUBE) OINT ophthalmic ointment    Sig: Place into both eyes at bedtime.    Dispense:  3.5 g    Refill:  0  . butalbital-acetaminophen-caffeine (FIORICET) 50-325-40 MG tablet    Sig: Take 1-2 tablets by mouth every 4 (four) hours as needed for headache. Max 6 caps/day    Dispense:  20 tablet    Refill:  0    *This clinic note was created using Scientist, clinical (histocompatibility and immunogenetics)Dragon dictation software. Therefore, there may be occasional mistakes despite careful proofreading.  ?   Domenick GongMortenson, Carrell Palmatier,  MD 09/02/18 1924

## 2018-09-02 NOTE — Discharge Instructions (Addendum)
1 g of Tylenol 3-4 times a day as needed for Headache, or take the Fioricet for  Severe headache. do not take the Fioricet and the Tylenol as they both have Tylenol in them.  Do not take more than 6 tabs of Fioricet in 24 hours.  Try the Patanol and the Lacri-Lube for your eyes.  Also try some cool compresses.  I suspect that part of your headache is coming from your temporomandibular joint.  The Flexeril will help stop you from grinding your teeth at night.  You may need a bite guard from your dentist.  Elizebeth Koller the prednisone unless a provider tells you to stop.

## 2018-09-03 MED FILL — predniSONE 20 MG TABS: 20 | 5 days supply | Qty: 10 | Fill #0

## 2018-09-03 MED FILL — BUTALB-ACETAMIN-CAFF 50-325: 50-325-40 | 5 days supply | Qty: 20 | Fill #0

## 2018-09-03 MED FILL — CYCLOBENZAPRINE 10 MG TAB: 10 | 20 days supply | Qty: 20 | Fill #0

## 2018-09-03 MED FILL — OLOPATADINE HCL 0.1% EYE DR: 0.1 | 25 days supply | Qty: 5 | Fill #0

## 2018-09-09 ENCOUNTER — Ambulatory Visit: Payer: 59 | Admitting: Family Medicine

## 2018-09-13 DIAGNOSIS — Z1389 Encounter for screening for other disorder: Secondary | ICD-10-CM | POA: Diagnosis not present

## 2018-09-13 DIAGNOSIS — Z6841 Body Mass Index (BMI) 40.0 and over, adult: Secondary | ICD-10-CM | POA: Diagnosis not present

## 2018-09-13 DIAGNOSIS — H5713 Ocular pain, bilateral: Secondary | ICD-10-CM | POA: Diagnosis not present

## 2018-09-13 DIAGNOSIS — R5383 Other fatigue: Secondary | ICD-10-CM | POA: Diagnosis not present

## 2018-09-13 DIAGNOSIS — R7309 Other abnormal glucose: Secondary | ICD-10-CM | POA: Diagnosis not present

## 2018-09-13 DIAGNOSIS — H538 Other visual disturbances: Secondary | ICD-10-CM | POA: Diagnosis not present

## 2018-09-13 DIAGNOSIS — R51 Headache: Secondary | ICD-10-CM | POA: Diagnosis not present

## 2018-09-15 ENCOUNTER — Telehealth: Payer: 59 | Admitting: Family Medicine

## 2018-09-18 DIAGNOSIS — H1013 Acute atopic conjunctivitis, bilateral: Secondary | ICD-10-CM | POA: Diagnosis not present

## 2018-09-18 DIAGNOSIS — H10013 Acute follicular conjunctivitis, bilateral: Secondary | ICD-10-CM | POA: Diagnosis not present

## 2018-09-25 DIAGNOSIS — H5213 Myopia, bilateral: Secondary | ICD-10-CM | POA: Diagnosis not present

## 2018-09-30 MED FILL — MONTELUKAST SOD 10 MG TAB: 10 | 30 days supply | Qty: 30 | Fill #0

## 2018-10-02 DIAGNOSIS — H04123 Dry eye syndrome of bilateral lacrimal glands: Secondary | ICD-10-CM | POA: Diagnosis not present

## 2018-10-04 ENCOUNTER — Ambulatory Visit: Payer: 59 | Admitting: Family Medicine

## 2018-10-05 ENCOUNTER — Ambulatory Visit: Payer: 59 | Admitting: Family Medicine

## 2018-10-07 ENCOUNTER — Other Ambulatory Visit: Payer: Self-pay | Admitting: Physician Assistant

## 2018-10-07 DIAGNOSIS — K319 Disease of stomach and duodenum, unspecified: Secondary | ICD-10-CM

## 2018-10-07 MED FILL — MONTELUKAST SOD 10 MG TAB: 10 | 30 days supply | Qty: 30 | Fill #0

## 2018-10-07 MED FILL — LANSOPRAZOLE 30 MG CPDR: 30 | 90 days supply | Qty: 90 | Fill #0

## 2018-10-29 ENCOUNTER — Other Ambulatory Visit: Payer: Self-pay | Admitting: *Deleted

## 2018-10-29 DIAGNOSIS — Z8249 Family history of ischemic heart disease and other diseases of the circulatory system: Secondary | ICD-10-CM

## 2018-11-10 ENCOUNTER — Ambulatory Visit
Admission: RE | Admit: 2018-11-10 | Discharge: 2018-11-10 | Disposition: A | Discharge status: Home / Self Care | Payer: Self-pay | Source: Ambulatory Visit | Attending: Cardiovascular Disease | Admitting: Cardiovascular Disease

## 2018-11-10 ENCOUNTER — Other Ambulatory Visit: Payer: Self-pay

## 2018-11-10 DIAGNOSIS — Z8249 Family history of ischemic heart disease and other diseases of the circulatory system: Secondary | ICD-10-CM

## 2018-11-11 MED FILL — AMOXICILLIN 500 MG CAPSULE: 500 | 1 days supply | Qty: 4 | Fill #0

## 2018-11-16 ENCOUNTER — Encounter (HOSPITAL_COMMUNITY): Payer: Self-pay

## 2018-12-29 IMAGING — US US ABDOMEN LIMITED
1 series · 14 of 25 positions shown · non-contrast
Comparison: None.

CLINICAL DATA: Right upper quadrant pain.

EXAM:
US ABDOMEN LIMITED - RIGHT UPPER QUADRANT

[Series 1: us abdomen limited · 0.30mm/px · 14 of 61 slices shown]
[im 1/61]
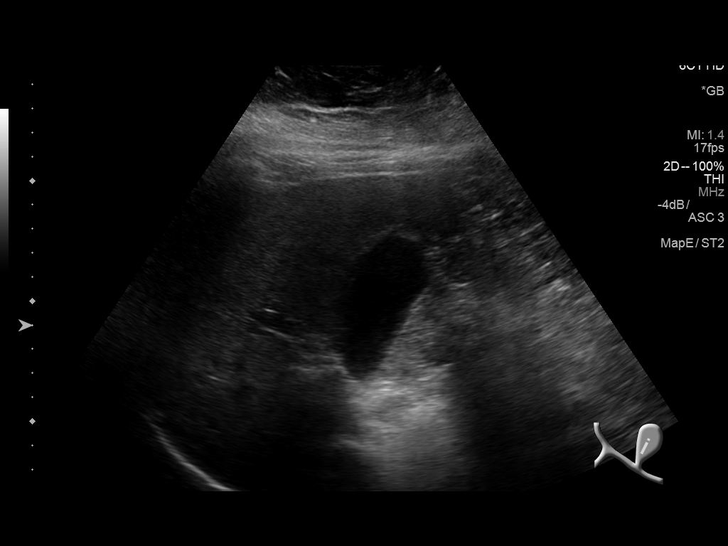
[im 6/61]
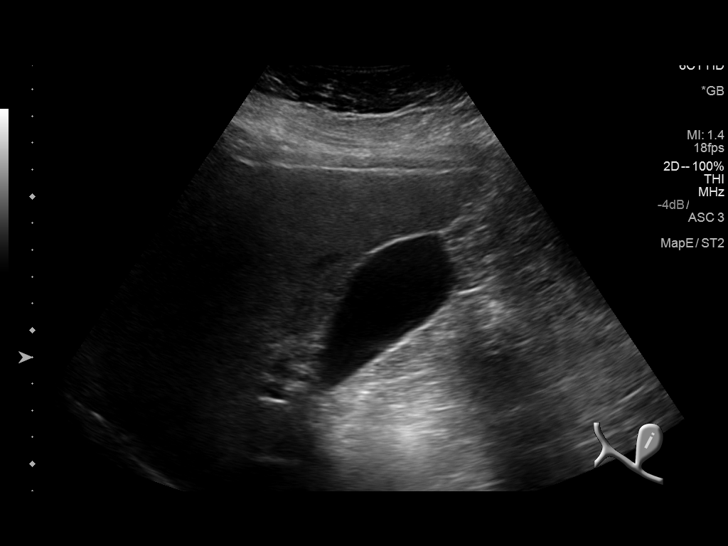
[im 11/61]
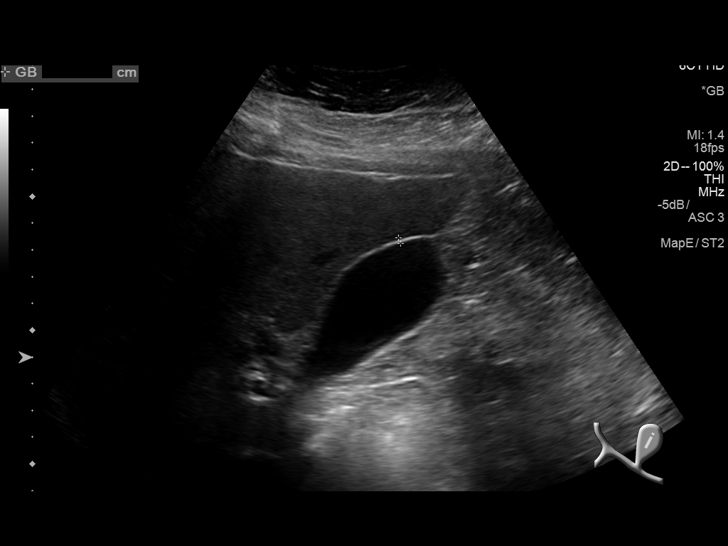
[im 16/61]
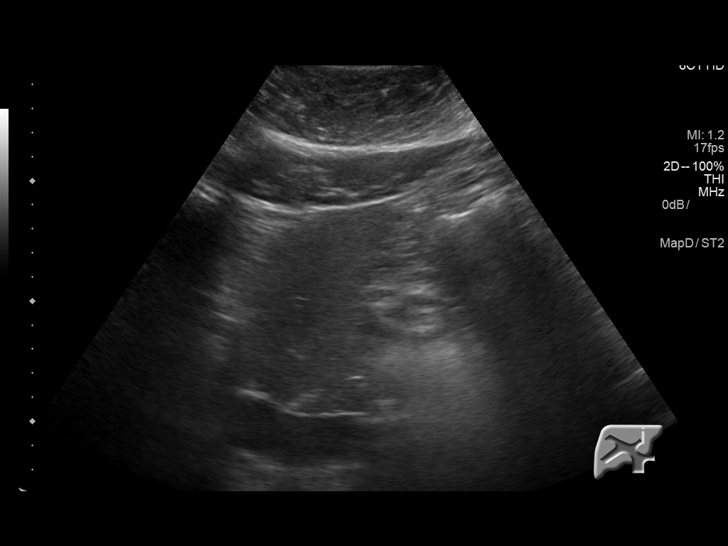
[im 21/61]
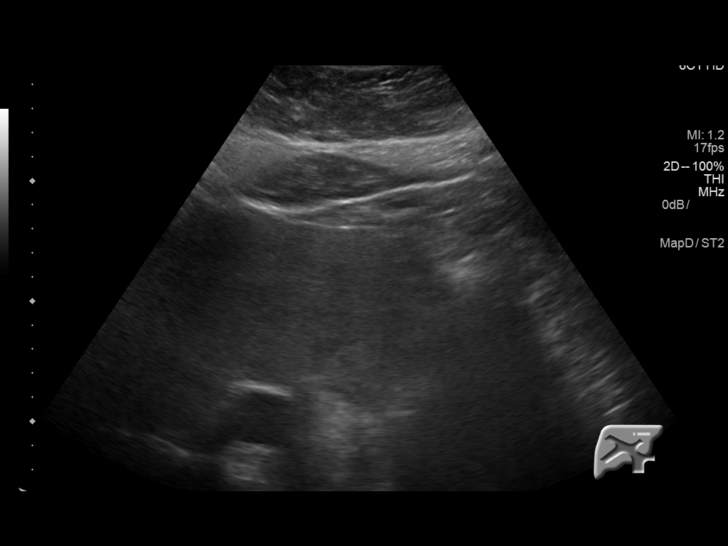
[im 23/61]
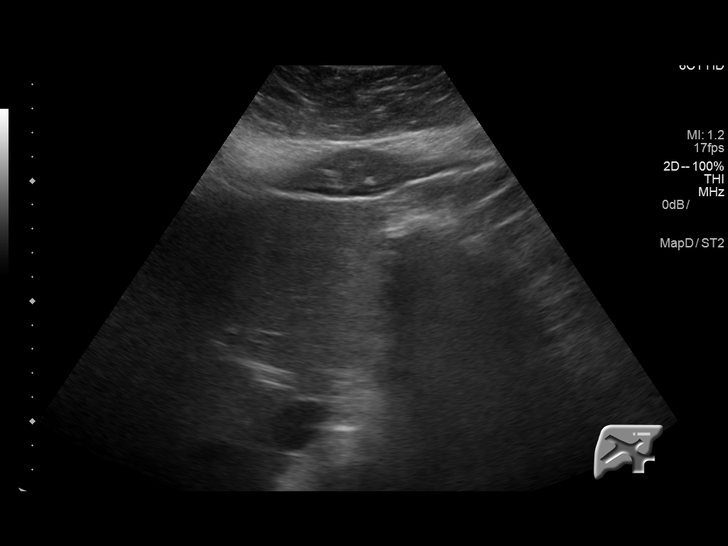
[im 28/61]
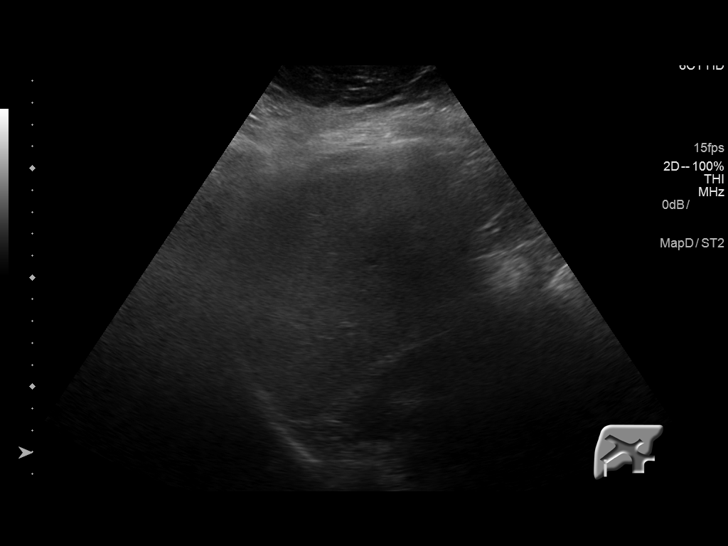
[im 33/61]
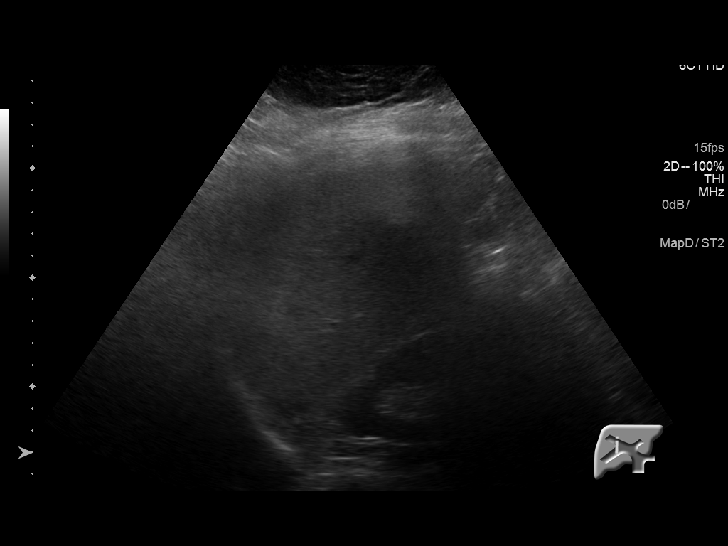
[im 38/61]
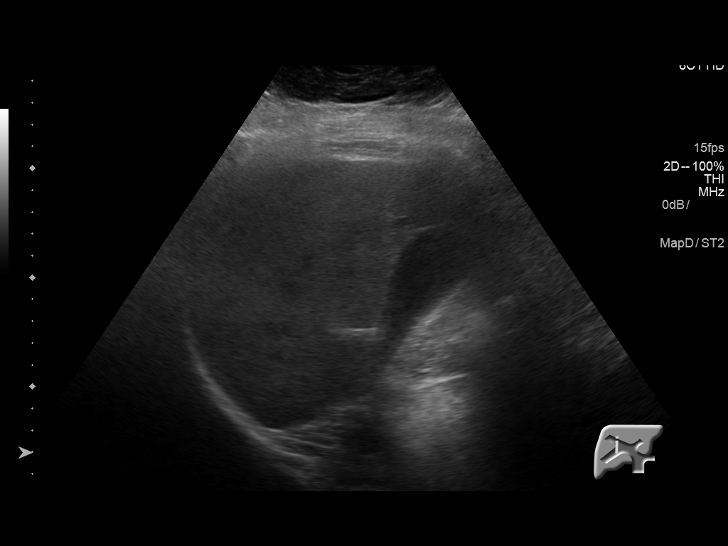
[im 41/61]
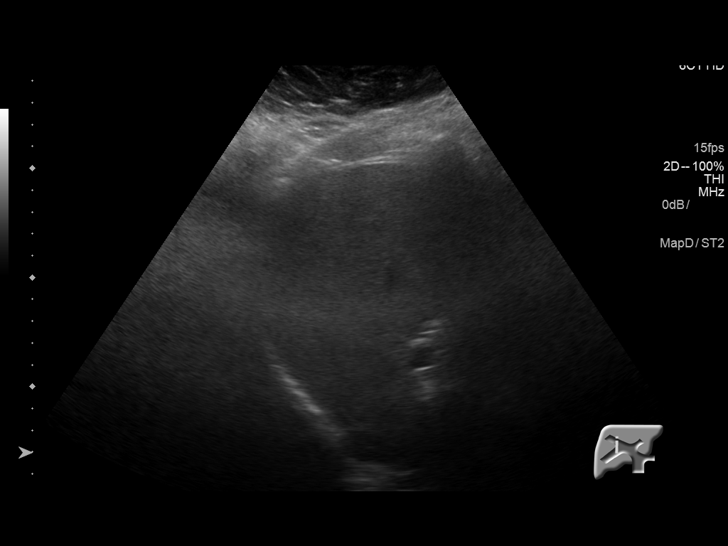
[im 46/61]
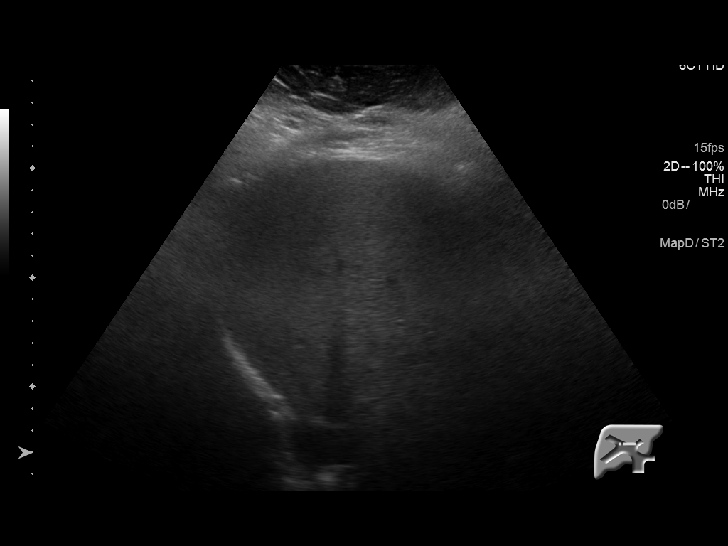
[im 51/61]
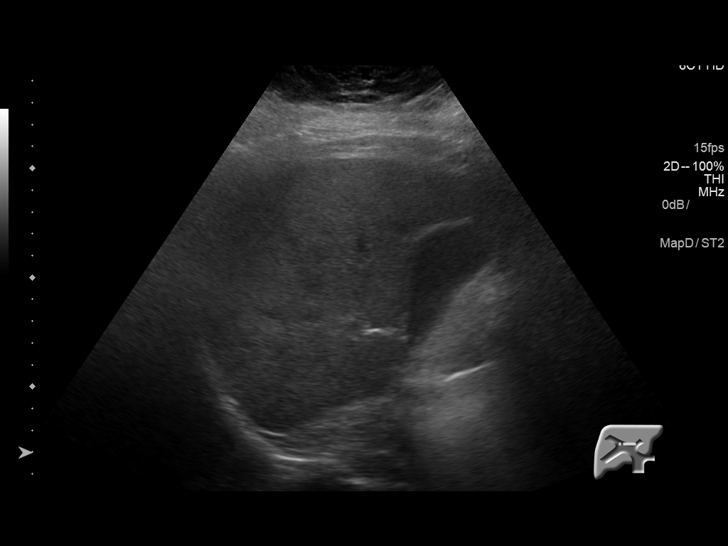
[im 56/61]
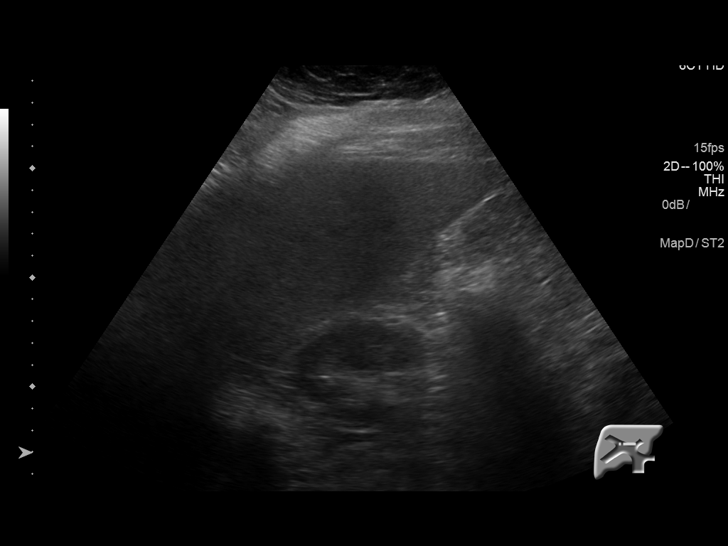
[im 61/61]
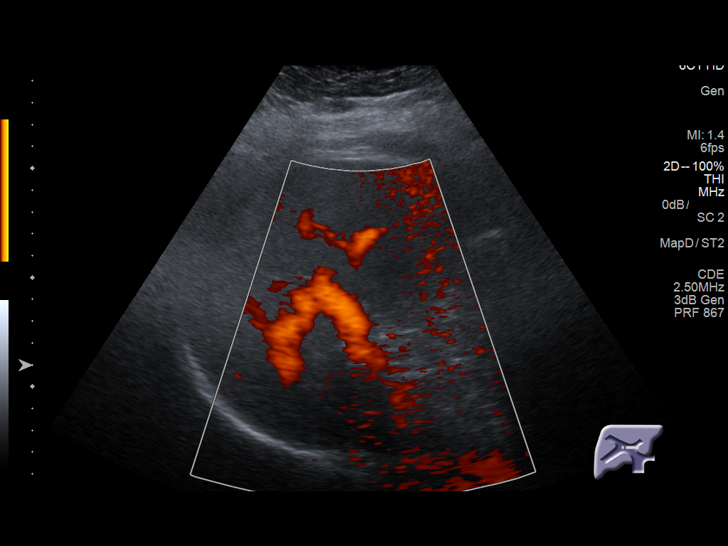

[14 of 25 positions shown; findings below may reference images not displayed]

FINDINGS: Gallbladder:

No gallstones. No gallstones. Gallbladder wall thickness normal.
Negative Murphy sign.

Common bile duct:

Diameter: 3.6 mm

Liver:

There is echogenic consistent fatty infiltration and/or
hepatocellular disease. No focal hepatic abnormality identified.
IMPRESSION: 1. Liver is echogenic consistent fatty infiltration and/or
hepatocellular disease. No focal hepatic abnormality identified.

2. No gallstones or biliary distention.

## 2019-02-24 ENCOUNTER — Telehealth: Payer: 59 | Admitting: Emergency Medicine

## 2019-02-24 DIAGNOSIS — R519 Headache, unspecified: Secondary | ICD-10-CM | POA: Diagnosis not present

## 2019-02-24 MED ORDER — AMOXICILLIN-POT CLAVULANATE 875-125 MG PO TABS: 1.0000 | ORAL_TABLET | Freq: Two times a day (BID) | ORAL | 0 refills | Status: DC

## 2019-02-24 MED ORDER — ALBUTEROL SULFATE HFA 108 (90 BASE) MCG/ACT IN AERS: 2.0000 | INHALATION_SPRAY | Freq: Four times a day (QID) | RESPIRATORY_TRACT | 0 refills | Status: DC | PRN

## 2019-02-24 MED FILL — AMOX-CLAV 875-125 MG TABLET: 875-125 | 10 days supply | Qty: 20 | Fill #0

## 2019-02-24 MED FILL — ALBUTEROL SULFATE HFA 108 (: 108 (90 BAS | 25 days supply | Qty: 18 | Fill #0

## 2019-02-24 NOTE — Progress Notes (Signed)
Time spent: 10 min  We are sorry that you are not feeling well.  Here is how we plan to help!  Based on what you have shared with me it looks like you have sinusitis.  Sinusitis is inflammation and infection in the sinus cavities of the head.  Based on your presentation I believe you most likely have Acute Bacterial Sinusitis.  This is an infection caused by bacteria and is treated with antibiotics. I have prescribed Augmentin 875mg /125mg  one tablet twice daily with food, for 7 days. You may use an oral decongestant such as Mucinex D or if you have glaucoma or high blood pressure use plain Mucinex. Saline nasal spray help and can safely be used as often as needed for congestion.  If you develop worsening sinus pain, fever or notice severe headache and vision changes, or if symptoms are not better after completion of antibiotic, please schedule an appointment with a health care provider.    Sinus infections are not as easily transmitted as other respiratory infection, however we still recommend that you avoid close contact with loved ones, especially the very young and elderly.  Remember to wash your hands thoroughly throughout the day as this is the number one way to prevent the spread of infection!  Home Care:  Only take medications as instructed by your medical team.  Complete the entire course of an antibiotic.  Do not take these medications with alcohol.  A steam or ultrasonic humidifier can help congestion.  You can place a towel over your head and breathe in the steam from hot water coming from a faucet.  Avoid close contacts especially the very young and the elderly.  Cover your mouth when you cough or sneeze.  Always remember to wash your hands.   These are other available  over the counter medicines that can be used for any other associated symptoms along with rest, fluids:  You can use over the counter medications to help with symptoms: ? [phone] mg acetaminophen (tylenol)  every 6 hours, around the clock to help with associated fevers, sore throat, headaches, generalized body aches and malaise.  ? Oxymetazoline (afrin) intranasal spray once daily for no more than 3 days to help with congestion, after 3 days you can switch to another over-the-counter nasal steroid spray such as fluticasone (flonase). Overuse of oxymetazoline can cause rebound nasal congestion, irritation and bleeding. ? Allergy medication (loratadine, cetirizine, etc) and phenylephrine (sudafed) help with nasal congestion, runny nose and postnasal drip.   ? Intranasal saline rinses, humidifier, or Neti pot nasal rinses can also relieve some nasal congestion, runny nose or post nasal drip  Get Help Right Away If:  You develop worsening fever or sinus pain.  You develop a severe head ache or visual changes.  Your symptoms persist after you have completed your treatment plan.  Make sure you  Understand these instructions.  Will watch your condition.  Will get help right away if you are not doing well or get worse.  Your e-visit answers were reviewed by a board certified advanced clinical practitioner to complete your personal care plan.  Depending on the condition, your plan could have included both over the counter or prescription medications.  If there is a problem please reply  once you have received a response from your provider.  Your safety is important to .  If you have drug allergies check your prescription carefully.    You can use MyChart to ask questions about today's visit, request a  non-urgent call back, or ask for a work or school excuse for 24 hours related to this e-Visit. If it has been greater than 24 hours you will need to follow up with your provider, or enter a new e-Visit to address those concerns.  You will get an e-mail in the next two days asking about your experience.  I hope that your e-visit has been valuable and will speed your recovery. Thank you for using  e-visits.  I hope you feel better!  Carmon Sails, PA-C

## 2019-03-23 NOTE — Progress Notes (Addendum)
Vials were not made on this date.  jm

## 2019-04-21 DIAGNOSIS — Z6839 Body mass index (BMI) 39.0-39.9, adult: Secondary | ICD-10-CM | POA: Diagnosis not present

## 2019-04-21 DIAGNOSIS — N951 Menopausal and female climacteric states: Secondary | ICD-10-CM | POA: Diagnosis not present

## 2019-04-21 DIAGNOSIS — K219 Gastro-esophageal reflux disease without esophagitis: Secondary | ICD-10-CM | POA: Diagnosis not present

## 2019-04-21 DIAGNOSIS — Z0001 Encounter for general adult medical examination with abnormal findings: Secondary | ICD-10-CM | POA: Diagnosis not present

## 2019-04-21 DIAGNOSIS — Z1389 Encounter for screening for other disorder: Secondary | ICD-10-CM | POA: Diagnosis not present

## 2019-05-02 ENCOUNTER — Telehealth: Payer: 59 | Admitting: Physician Assistant

## 2019-05-02 DIAGNOSIS — L03113 Cellulitis of right upper limb: Secondary | ICD-10-CM

## 2019-05-02 MED ORDER — CEPHALEXIN 500 MG PO CAPS: 500.0000 mg | ORAL_CAPSULE | Freq: Four times a day (QID) | ORAL | 0 refills | Status: DC

## 2019-05-02 MED FILL — CEPHALEXIN 500 MG CAPSULE: 500 | 5 days supply | Qty: 20 | Fill #0

## 2019-05-02 NOTE — Progress Notes (Signed)
E Visit for Cellulitis  We are sorry that you are not feeling well. Here is how we plan to help!  Based on what you shared with me it looks like you have cellulitis.  Cellulitis looks like areas of skin redness, swelling, and warmth; it develops as a result of bacteria entering under the skin. Little red spots and/or bleeding can be seen in skin, and tiny surface sacs containing fluid can occur. Fever can be present. Cellulitis is almost always on one side of a body, and the lower limbs are the most common site of involvement.   I have prescribed:  Keflex 500mg take one by mouth four times a day for 5 days  HOME CARE:  . Take your medications as ordered and take all of them, even if the skin irritation appears to be healing.   GET HELP RIGHT AWAY IF:  . Symptoms that don't begin to go away within 48 hours. . Severe redness persists or worsens . If the area turns color, spreads or swells. . If it blisters and opens, develops yellow-brown crust or bleeds. . You develop a fever or chills. . If the pain increases or becomes unbearable.  . Are unable to keep fluids and food down.  MAKE SURE YOU    Understand these instructions.  Will watch your condition.  Will get help right away if you are not doing well or get worse.  Thank you for choosing an e-visit. Your e-visit answers were reviewed by a board certified advanced clinical practitioner to complete your personal care plan. Depending upon the condition, your plan could have included both over the counter or prescription medications. Please review your pharmacy choice. Make sure the pharmacy is open so you can pick up prescription now. If there is a problem, you may contact your provider through MyChart messaging and have the prescription routed to another pharmacy. Your safety is important to us. If you have drug allergies check your prescription carefully.  For the next 24 hours you can use MyChart to ask questions about today's  visit, request a non-urgent call back, or ask for a work or school excuse. You will get an email in the next two days asking about your experience. I hope that your e-visit has been valuable and will speed your recovery.   Lochlann Mastrangelo PA-C   Approximately 5 minutes was spent documenting and reviewing patient's chart.   

## 2019-05-06 DIAGNOSIS — Z6839 Body mass index (BMI) 39.0-39.9, adult: Secondary | ICD-10-CM | POA: Diagnosis not present

## 2019-05-06 DIAGNOSIS — Z1389 Encounter for screening for other disorder: Secondary | ICD-10-CM | POA: Diagnosis not present

## 2019-06-09 IMAGING — MG DIGITAL SCREENING BILATERAL MAMMOGRAM WITH TOMO AND CAD
8 series · 9 of 24 positions shown · non-contrast
Comparison: Previous exam(s).

CLINICAL DATA: Screening.

EXAM:
DIGITAL SCREENING BILATERAL MAMMOGRAM WITH TOMO AND CAD

[L CC synth-2D]
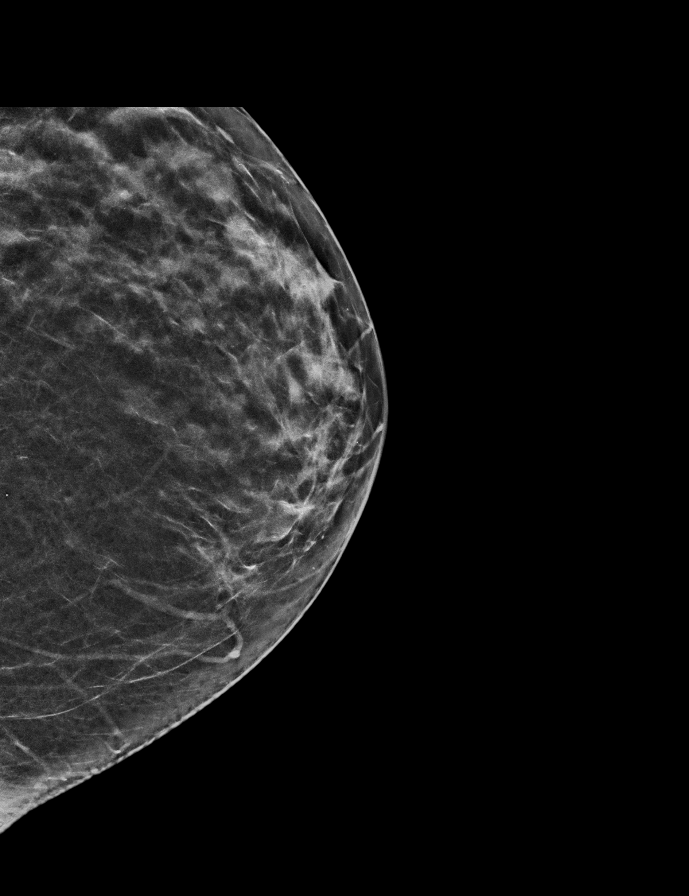

[R CC synth-2D]
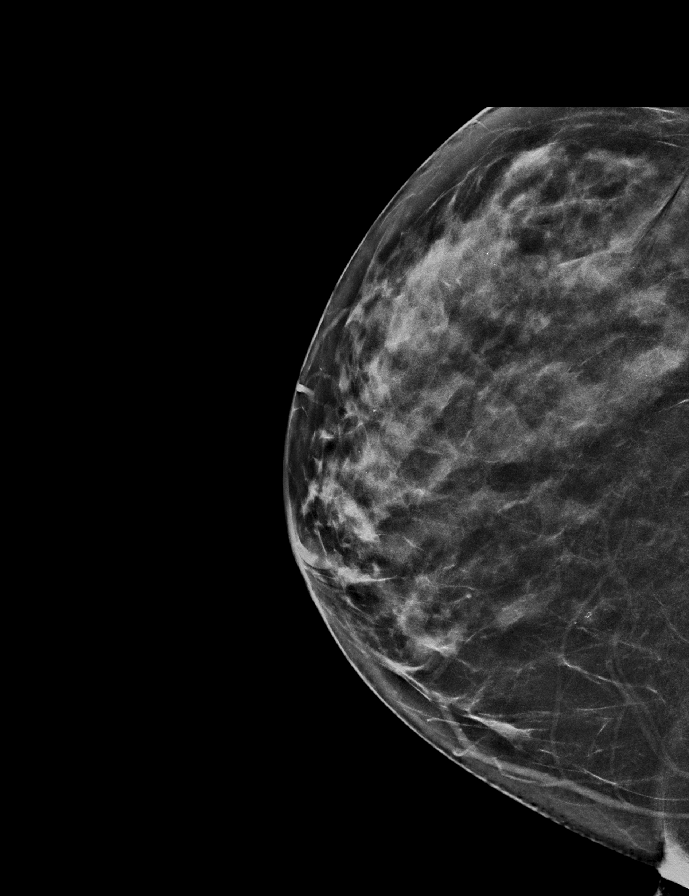

[R MLO synth-2D]
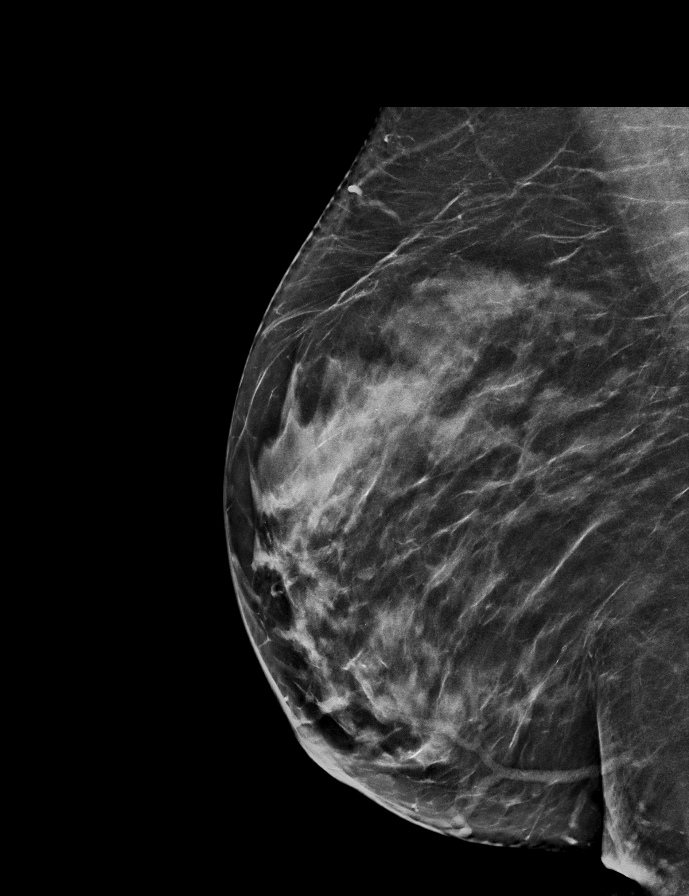

[L MLO synth-2D]
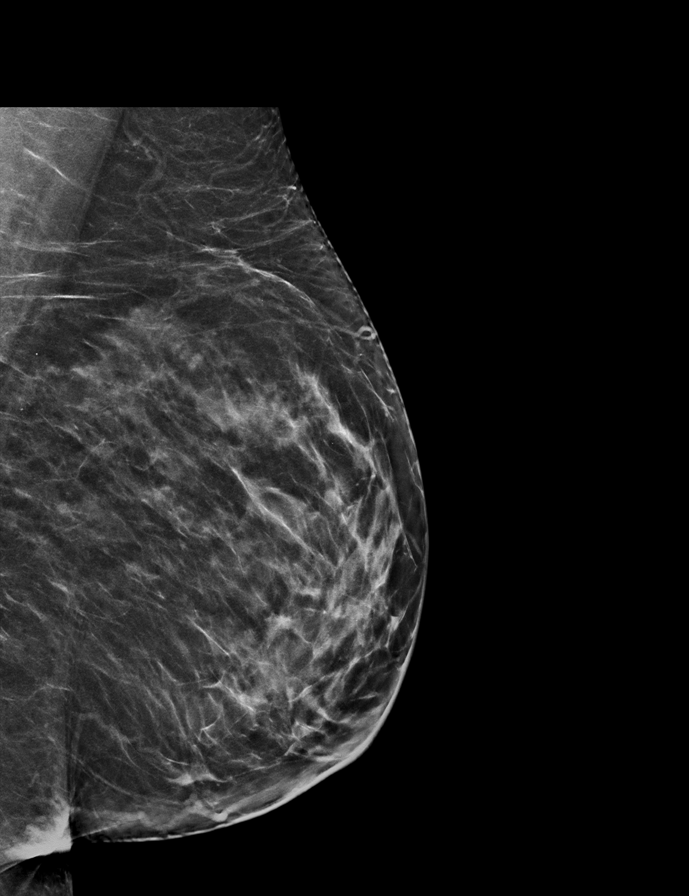

[L MLO tomo · 2 of 65 frames shown]
[frame 21/65]
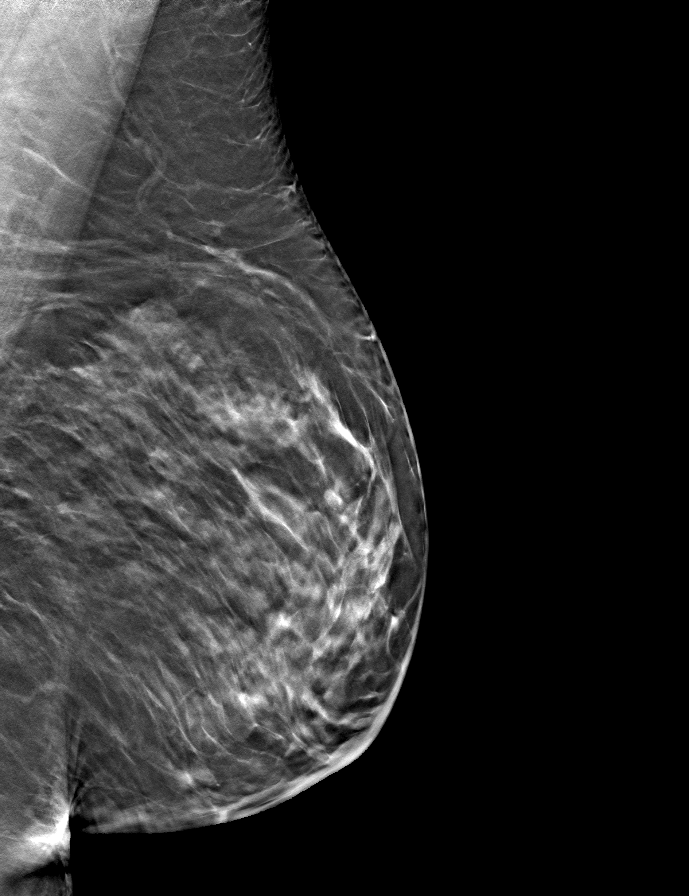
[frame 33/65]
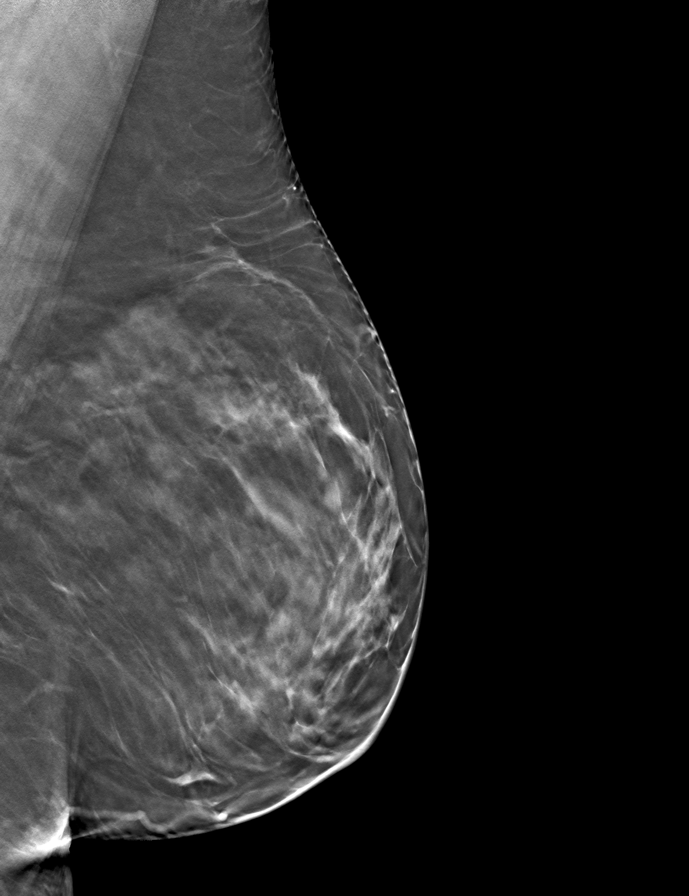

[R MLO tomo · tomo slice 35/70.0]
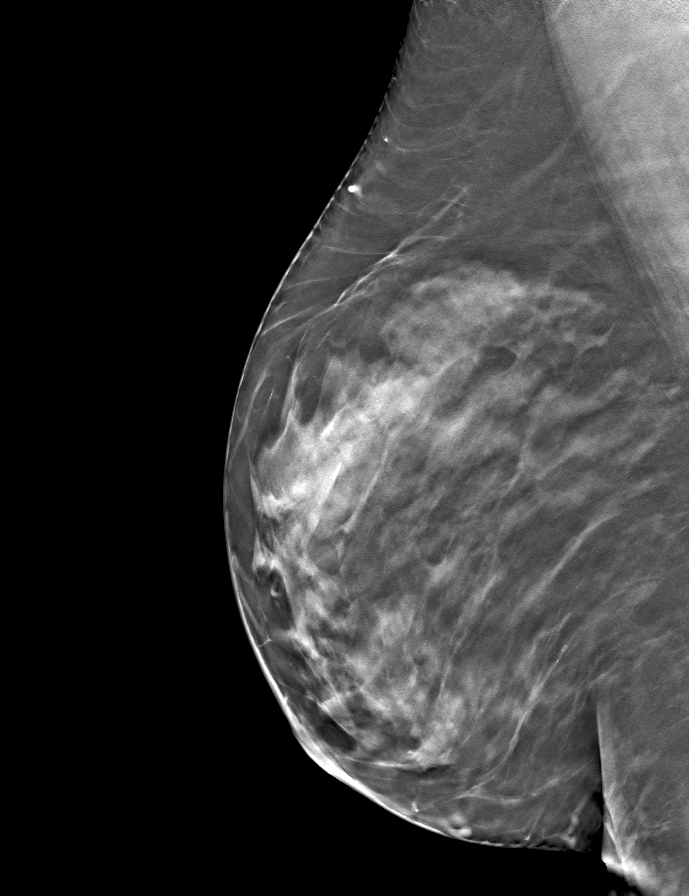

[L CC tomo · tomo slice 29/57.0]
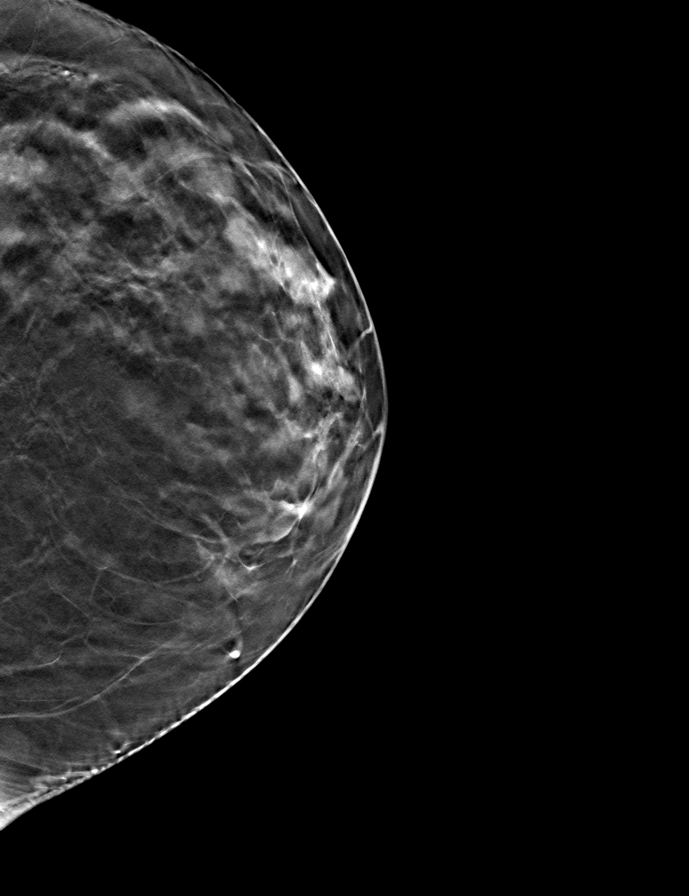

[R CC tomo · tomo slice 31/60.0]
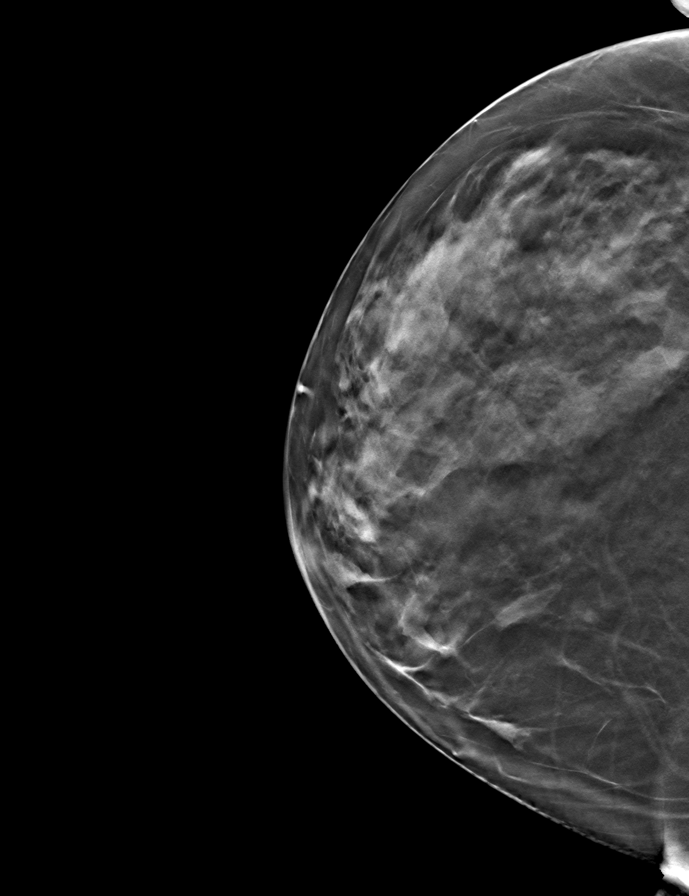

[9 of 24 positions shown; findings below may reference images not displayed]

ACR Breast Density Category c: The breast tissue is heterogeneously
dense, which may obscure small masses.
FINDINGS: There are no findings suspicious for malignancy. Images were
processed with CAD.
IMPRESSION: No mammographic evidence of malignancy. A result letter of this
screening mammogram will be mailed directly to the patient.

RECOMMENDATION:
Screening mammogram in one year. (Code:[id number]-U-LHB)

BI-RADS CATEGORY  1: Negative.

## 2019-06-26 ENCOUNTER — Other Ambulatory Visit: Payer: Self-pay

## 2019-06-26 ENCOUNTER — Ambulatory Visit
Admission: EM | Admit: 2019-06-26 | Discharge: 2019-06-26 | Disposition: A | Discharge status: Home / Self Care | Payer: 59 | Attending: Emergency Medicine | Admitting: Emergency Medicine

## 2019-06-26 DIAGNOSIS — M79642 Pain in left hand: Secondary | ICD-10-CM

## 2019-06-26 DIAGNOSIS — M79641 Pain in right hand: Secondary | ICD-10-CM | POA: Diagnosis not present

## 2019-06-26 DIAGNOSIS — M25649 Stiffness of unspecified hand, not elsewhere classified: Secondary | ICD-10-CM

## 2019-06-26 MED ORDER — PREDNISONE 20 MG PO TABS: 20.0000 mg | ORAL_TABLET | Freq: Two times a day (BID) | ORAL | 0 refills | Status: AC

## 2019-06-26 NOTE — Discharge Instructions (Signed)
Continue conservative management of rest, ice, and gentle stretches Prednisone prescribed.  Take as directed and to completion Follow up with PCP tomorrow for blood work Return or go to the ER if you have any new or worsening symptoms (fever, chills, redness, bruising, deformity, passing out, shortness of breath, etc...)

## 2019-06-26 NOTE — ED Triage Notes (Signed)
Pt seen by provider prior to RN, see provider note  

## 2019-06-26 NOTE — ED Provider Notes (Signed)
[facility]   [id number] 06/26/19 Arrival Time: 1252  CC: Multiple joint pain  SUBJECTIVE: History from: patient. Monica Hernandez is a 44 y.o. female complains of multiple joint pain and swelling x 1 day.  States hands are her main concern today.  Denies a precipitating event or specific injury.  States she woke up this morning and her hands were stiff.  Called out of work due to the pain and stiffness.  Localizes the pain to the hands.  Describes the pain as constant and stiff/ throbbing in character.  Has tried OTC medications without relief.  Symptoms are made worse with make a fist.  Reports hx of similar symptoms in the past.  Denies fever, chills, erythema, ecchymosis, effusion, weakness, numbness and tingling.  Hx also significant for gastric bypass.  Takes vitamins and supplements  Requests work note as well.    ROS: As per HPI.  All other pertinent ROS negative.     Past Medical History:  Diagnosis Date  . Allergy   . Anemia   . Asthma   . Eczema   . GERD (gastroesophageal reflux disease)   . History of kidney stones   . Hypertension   . Kidney stone   . Mitral valve prolapse    was told as a kid that she had this but during ECHO for eval, ECHO did not indicate this , see 08-25-16 epic result   . PONV (postoperative nausea and vomiting)    epidural with c section caused N/V   . Pre-diabetes   . Recurrent upper respiratory infection (URI)   . Ulcer    Past Surgical History:  Procedure Laterality Date  . ABDOMINAL HYSTERECTOMY    . ADENOIDECTOMY    . ADENOIDECTOMY, TONSILLECTOMY AND MYRINGOTOMY WITH TUBE PLACEMENT    . CESAREAN SECTION    . FRACTURE SURGERY    . GASTRIC ROUX-EN-Y N/A 03/31/2017   Procedure: LAPAROSCOPIC ROUX-EN-Y GASTRIC BYPASS WITH UPPER ENDOSCOPY;  Surgeon: Sheliah Hatch, De Blanch, MD;  Location: [facility];  Service: General;  Laterality: N/A;  . LEG SURGERY Left   . TONSILLECTOMY    . TUBAL LIGATION     Allergies  Allergen Reactions  .  Contrave [Naltrexone-Bupropion Hcl Er] Nausea And Vomiting    Pt states it causes bloody stool, bloody vomit, and heart palpitations  . Erythromycin Nausea And Vomiting and Rash    Wheezing   . Shellfish Allergy Anaphylaxis  . Zithromax [Azithromycin] Nausea And Vomiting, Rash and Other (See Comments)  . Belviq [Lorcaserin Hcl] Other (See Comments)    Causes changes in vision and respiratory issues  . Qsymia [Phentermine-Topiramate] Other (See Comments)    Causes change in vision and respiratory issues   . Saxenda [Liraglutide -Weight Management] Other (See Comments)    Causes fainting  . Allegra-D [Fexofenadine-Pseudoephed Er] Nausea And Vomiting  . Levocetirizine Other (See Comments)    headache  . Naltrexone Other (See Comments)    Bloody stools.   . Fish Allergy Rash  . Sulfa Antibiotics Rash   No current facility-administered medications on file prior to encounter.   Current Outpatient Medications on File Prior to Encounter  Medication Sig Dispense Refill  . acetaminophen (TYLENOL) 500 MG tablet Take 1,000 mg by mouth every 6 (six) hours as needed for mild pain or moderate pain.    Marland Kitchen albuterol (PROVENTIL HFA;VENTOLIN HFA) 108 (90 Base) MCG/ACT inhaler Inhale into the lungs every 6 (six) hours as needed for wheezing or shortness of breath.    Marland Kitchen  albuterol (VENTOLIN HFA) 108 (90 Base) MCG/ACT inhaler Inhale 2 puffs into the lungs every 6 (six) hours as needed for wheezing or shortness of breath. 8 g 0  . amoxicillin-clavulanate (AUGMENTIN) 875-125 MG tablet Take 1 tablet by mouth 2 (two) times daily. 20 tablet 0  . artificial tears (LACRILUBE) OINT ophthalmic ointment Place into both eyes at bedtime. 3.5 g 0  . azelastine (ASTELIN) 0.1 % nasal spray Place 2 sprays into both nostrils 2 (two) times daily as needed for rhinitis. 30 mL 2  . butalbital-acetaminophen-caffeine (FIORICET) 50-325-40 MG tablet Take 1-2 tablets by mouth every 4 (four) hours as needed for headache. Max 6 caps/day  20 tablet 0  . Calcium-Phosphorus-Vitamin D (CALCIUM/VITAMIN D3/ADULT GUMMY) 250-100-500 MG-MG-UNIT CHEW Chew 2 tablets by mouth 3 (three) times daily.    . cephALEXin (KEFLEX) 500 MG capsule Take 1 capsule (500 mg total) by mouth 4 (four) times daily. 20 capsule 0  . Cholecalciferol 2000 units CAPS Take 2,000 Units by mouth daily.    . cyclobenzaprine (FLEXERIL) 10 MG tablet Take 1 tablet (10 mg total) by mouth at bedtime. 20 tablet 0  . dicyclomine (BENTYL) 10 MG capsule Take 1 capsule (10 mg total) by mouth 3 (three) times daily before meals. (Patient taking differently: Take 10 mg by mouth every morning. ) 270 capsule 3  . lansoprazole (PREVACID) 30 MG capsule TAKE 1 CAPSULE BY MOUTH DAILY. 90 capsule 0  . Multiple Vitamins-Minerals (BARIATRIC MULTIVITAMINS/IRON PO) Take 2 tablets by mouth 2 (two) times daily. Chewable tablets    . olopatadine (PATANOL) 0.1 % ophthalmic solution Place 1 drop into both eyes 2 (two) times daily. 5 mL 0  . [DISCONTINUED] Carbinoxamine Maleate 4 MG TABS Take 1 tablet (4 mg total) by mouth 3 (three) times daily as needed. 30 tablet 5   Social History   Socioeconomic History  . Marital status: Married    Spouse name: Not on file  . Number of children: 1  . Years of education: Not on file  . Highest education level: Not on file  Occupational History    Employer: [employer]    Comment: heart care  Tobacco Use  . Smoking status: Former Smoker    Types: Cigarettes    Quit date: 2010    Years since quitting: 11.3  . Smokeless tobacco: Never Used  . Tobacco comment: smoked since age 65 ; quit in 2010   Substance and Sexual Activity  . Alcohol use: No  . Drug use: No  . Sexual activity: Yes    Partners: Male    Birth control/protection: Surgical  Other Topics Concern  . Not on file  Social History Narrative  . Not on file   Social Determinants of Health   Financial Resource Strain:   . Difficulty of Paying Living Expenses:   Food Insecurity:   .  Worried About Programme researcher, broadcasting/film/video in the Last Year:   . Barista in the Last Year:   Transportation Needs:   . Freight forwarder (Medical):   Marland Kitchen Lack of Transportation (Non-Medical):   Physical Activity:   . Days of Exercise per Week:   . Minutes of Exercise per Session:   Stress:   . Feeling of Stress :   Social Connections:   . Frequency of Communication with Friends and Family:   . Frequency of Social Gatherings with Friends and Family:   . Attends Religious Services:   . Active Member of Clubs or Organizations:   .  Attends Archivist Meetings:   Marland Kitchen Marital Status:   Intimate Partner Violence:   . Fear of Current or Ex-Partner:   . Emotionally Abused:   Marland Kitchen Physically Abused:   . Sexually Abused:    Family History  Problem Relation Age of Onset  . Hypertension Mother   . Stroke Mother   . Thyroid cancer Mother   . Stroke Father   . Lung cancer Father   . Brain cancer Father   . Bone cancer Father   . Hypertension Sister   . Heart disease Maternal Grandmother   . Diabetes Maternal Grandmother   . Asthma Other   . Cancer Other   . Stomach cancer Neg Hx   . Colon cancer Neg Hx     OBJECTIVE:  Vitals:   06/26/19 1258  BP: 119/82  Pulse: 63  Resp: 16  Temp: 98.2 F (36.8 C)  TempSrc: Tympanic  SpO2: 96%    General appearance: ALERT; in no acute distress.  Head: NCAT Lungs: Normal respiratory effort CV: Radial pulses 2+ bilaterally. Cap refill < 2 seconds Musculoskeletal: Bilateral hands Inspection: Skin warm, dry, clear and intact without obvious erythema, effusion, or ecchymosis.  Palpation: Nontender to palpation ROM: makes fists without difficulty on exam Strength: 5/5 grip strength Skin: warm and dry Neurologic: Ambulates without difficulty Psychological: alert and cooperative; normal mood and affect   ASSESSMENT & PLAN:  1. Bilateral hand pain   2. Stiffness of hand joint, unspecified laterality     Meds ordered this  encounter  Medications  . predniSONE (DELTASONE) 20 MG tablet    Sig: Take 1 tablet (20 mg total) by mouth 2 (two) times daily with a meal for 5 days.    Dispense:  10 tablet    Refill:  0    Order Specific Question:   Supervising Provider    Answer:   Raylene Everts [[id number]]   Continue conservative management of rest, ice, and gentle stretches Prednisone prescribed.  Take as directed and to completion Follow up with PCP tomorrow for blood work Return or go to the ER if you have any new or worsening symptoms (fever, chills, redness, bruising, deformity, passing out, shortness of breath, etc...)   Reviewed expectations re: course of current medical issues. Questions answered. Outlined signs and symptoms indicating need for more acute intervention. Patient verbalized understanding. After Visit Summary given.    Lestine Box, PA-C 06/26/19 1353

## 2019-07-14 DIAGNOSIS — Z6841 Body Mass Index (BMI) 40.0 and over, adult: Secondary | ICD-10-CM | POA: Diagnosis not present

## 2019-07-14 DIAGNOSIS — R002 Palpitations: Secondary | ICD-10-CM | POA: Diagnosis not present

## 2019-07-14 DIAGNOSIS — M064 Inflammatory polyarthropathy: Secondary | ICD-10-CM | POA: Diagnosis not present

## 2019-07-29 ENCOUNTER — Telehealth: Payer: 59 | Admitting: Emergency Medicine

## 2019-07-29 DIAGNOSIS — J019 Acute sinusitis, unspecified: Secondary | ICD-10-CM

## 2019-07-29 MED ORDER — AMOXICILLIN-POT CLAVULANATE 875-125 MG PO TABS: 1.0000 | ORAL_TABLET | Freq: Two times a day (BID) | ORAL | 0 refills | Status: DC

## 2019-07-29 MED FILL — AMOXICILLIN 500 MG CAPSULE: 500 | 1 days supply | Qty: 4 | Fill #0

## 2019-07-29 NOTE — Progress Notes (Signed)

## 2019-07-30 MED ORDER — AMOXICILLIN-POT CLAVULANATE 875-125 MG PO TABS: 1.0000 | ORAL_TABLET | Freq: Two times a day (BID) | ORAL | 0 refills | Status: DC

## 2019-07-30 NOTE — Addendum Note (Signed)
Addended by: Bennie Pierini on: 07/30/2019 09:07 AM   Modules accepted: Orders

## 2019-08-03 MED ORDER — AMOXICILLIN-POT CLAVULANATE 875-125 MG PO TABS: 1.0000 | ORAL_TABLET | Freq: Two times a day (BID) | ORAL | 0 refills | Status: DC

## 2019-08-03 NOTE — Addendum Note (Signed)
Addended by: Bennie Pierini on: 08/03/2019 05:06 PM   Modules accepted: Orders

## 2019-08-16 NOTE — Progress Notes (Signed)
CARDIOLOGY CONSULT NOTE       Patient ID: Monica Hernandez MRN: [id number] DOB/AGE: 1975/12/30 44 y.o.  Admit date: (Not on file) Referring Physician: Sherwood Gambler Primary Physician: Elfredia Nevins, MD Primary Cardiologist: New Reason for Consultation: Palpitations  Active Problems:   * No active hospital problems. *   HPI:  44 y.o. referred by Dr Sherwood Gambler for palpitations. She has a history of asthma and HTN Recent prednisone taper for  Arthritis in hands  Bouts of polyarthritis Rx with steroids but they make her hungry and gain weight   Also Rx for sinusitis with Augmentin Palpitations occur through out the day and can wake her from sleep   Lab review 07/14/19 normal including ANA RF C reactive protein   Her palpitations are always related to taking proton pump inhibitors and she says she has tried a bunch of them Had gastric bypass and hernia repair 2 years ago and has had reflux / GERD since. Has seen [facility] Dr Russella Dar. She does not get palpitations unless she takes stomach medication. She also gets more LE edema with them No chest pain , history of DVT or syncope  ROS All other systems reviewed and negative except as noted above  Past Medical History:  Diagnosis Date  . Allergy   . Anemia   . Asthma   . Eczema   . GERD (gastroesophageal reflux disease)   . History of kidney stones   . Hypertension   . Kidney stone   . Mitral valve prolapse    was told as a kid that she had this but during ECHO for eval, ECHO did not indicate this , see 08-25-16 epic result   . PONV (postoperative nausea and vomiting)    epidural with c section caused N/V   . Pre-diabetes   . Recurrent upper respiratory infection (URI)   . Ulcer     Family History  Problem Relation Age of Onset  . Hypertension Mother   . Stroke Mother   . Thyroid cancer Mother   . Stroke Father   . Lung cancer Father   . Brain cancer Father   . Bone cancer Father   . Hypertension Sister   . Heart disease  Maternal Grandmother   . Diabetes Maternal Grandmother   . Asthma Other   . Cancer Other   . Stomach cancer Neg Hx   . Colon cancer Neg Hx     Social History   Socioeconomic History  . Marital status: Married    Spouse name: Not on file  . Number of children: 1  . Years of education: Not on file  . Highest education level: Not on file  Occupational History    Employer: [employer]    Comment: heart care  Tobacco Use  . Smoking status: Former Smoker    Types: Cigarettes    Quit date: 2010    Years since quitting: 11.4  . Smokeless tobacco: Never Used  . Tobacco comment: smoked since age 13 ; quit in 2010   Vaping Use  . Vaping Use: Never used  Substance and Sexual Activity  . Alcohol use: No  . Drug use: No  . Sexual activity: Yes    Partners: Male    Birth control/protection: Surgical  Other Topics Concern  . Not on file  Social History Narrative  . Not on file   Social Determinants of Health   Financial Resource Strain:   . Difficulty of Paying Living Expenses:   Food  Insecurity:   . Worried About Programme researcher, broadcasting/film/video in the Last Year:   . Barista in the Last Year:   Transportation Needs:   . Freight forwarder (Medical):   Marland Kitchen Lack of Transportation (Non-Medical):   Physical Activity:   . Days of Exercise per Week:   . Minutes of Exercise per Session:   Stress:   . Feeling of Stress :   Social Connections:   . Frequency of Communication with Friends and Family:   . Frequency of Social Gatherings with Friends and Family:   . Attends Religious Services:   . Active Member of Clubs or Organizations:   . Attends Banker Meetings:   Marland Kitchen Marital Status:   Intimate Partner Violence:   . Fear of Current or Ex-Partner:   . Emotionally Abused:   Marland Kitchen Physically Abused:   . Sexually Abused:     Past Surgical History:  Procedure Laterality Date  . ABDOMINAL HYSTERECTOMY    . ADENOIDECTOMY    . ADENOIDECTOMY, TONSILLECTOMY AND MYRINGOTOMY  WITH TUBE PLACEMENT    . CESAREAN SECTION    . FRACTURE SURGERY    . GASTRIC ROUX-EN-Y N/A 03/31/2017   Procedure: LAPAROSCOPIC ROUX-EN-Y GASTRIC BYPASS WITH UPPER ENDOSCOPY;  Surgeon: Sheliah Hatch, De Blanch, MD;  Location: [facility];  Service: General;  Laterality: N/A;  . LEG SURGERY Left   . TONSILLECTOMY    . TUBAL LIGATION        Current Outpatient Medications:  .  acetaminophen (TYLENOL) 500 MG tablet, Take 1,000 mg by mouth every 6 (six) hours as needed for mild pain or moderate pain., Disp: , Rfl:  .  albuterol (PROVENTIL HFA;VENTOLIN HFA) 108 (90 Base) MCG/ACT inhaler, Inhale into the lungs every 6 (six) hours as needed for wheezing or shortness of breath., Disp: , Rfl:  .  albuterol (VENTOLIN HFA) 108 (90 Base) MCG/ACT inhaler, Inhale 2 puffs into the lungs every 6 (six) hours as needed for wheezing or shortness of breath., Disp: 8 g, Rfl: 0 .  artificial tears (LACRILUBE) OINT ophthalmic ointment, Place into both eyes at bedtime., Disp: 3.5 g, Rfl: 0 .  azelastine (ASTELIN) 0.1 % nasal spray, Place 2 sprays into both nostrils 2 (two) times daily as needed for rhinitis., Disp: 30 mL, Rfl: 2 .  butalbital-acetaminophen-caffeine (FIORICET) 50-325-40 MG tablet, Take 1-2 tablets by mouth every 4 (four) hours as needed for headache. Max 6 caps/day, Disp: 20 tablet, Rfl: 0 .  Calcium-Phosphorus-Vitamin D (CALCIUM/VITAMIN D3/ADULT GUMMY) 250-100-500 MG-MG-UNIT CHEW, Chew 2 tablets by mouth 3 (three) times daily., Disp: , Rfl:  .  Cholecalciferol 2000 units CAPS, Take 2,000 Units by mouth daily., Disp: , Rfl:  .  lansoprazole (PREVACID) 30 MG capsule, TAKE 1 CAPSULE BY MOUTH DAILY., Disp: 90 capsule, Rfl: 0 .  Multiple Vitamins-Minerals (BARIATRIC MULTIVITAMINS/IRON PO), Take 2 tablets by mouth 2 (two) times daily. Chewable tablets, Disp: , Rfl:  .  olopatadine (PATANOL) 0.1 % ophthalmic solution, Place 1 drop into both eyes 2 (two) times daily., Disp: 5 mL, Rfl: 0 .  cyclobenzaprine (FLEXERIL)  10 MG tablet, Take 1 tablet (10 mg total) by mouth at bedtime., Disp: 20 tablet, Rfl: 0 .  dicyclomine (BENTYL) 10 MG capsule, Take 1 capsule (10 mg total) by mouth 3 (three) times daily before meals. (Patient taking differently: Take 10 mg by mouth every morning. ), Disp: 270 capsule, Rfl: 3    Physical Exam: Height 5\' 4"  (1.626 m).    Affect  appropriate Healthy:  appears stated age 29: normal Neck supple with no adenopathy JVP normal no bruits no thyromegaly Lungs clear with no wheezing and good diaphragmatic motion Heart:  S1/S2 no murmur, no rub, gallop or click PMI normal Abdomen: benighn, BS positve, no tenderness, no AAA no bruit.  No HSM or HJR Distal pulses intact with no bruits No edema Neuro non-focal Skin warm and dry No muscular weakness   Labs:   Lab Results  Component Value Date   WBC 8.4 04/01/2018   HGB 12.7 04/01/2018   HCT 38.2 04/01/2018   MCV 88 04/01/2018   PLT 264 04/01/2018   No results for input(s): NA, K, CL, CO2, BUN, CREATININE, CALCIUM, PROT, BILITOT, ALKPHOS, ALT, AST, GLUCOSE in the last 168 hours.  Invalid input(s): LABALBU Lab Results  Component Value Date   TROPONINI <0.03 10/28/2017    Lab Results  Component Value Date   CHOL 169 07/06/2017   CHOL 203 (H) 06/07/2016   Lab Results  Component Value Date   HDL 50 07/06/2017   HDL 58 06/07/2016   Lab Results  Component Value Date   LDLCALC 102 (H) 07/06/2017   LDLCALC 122 (H) 06/07/2016   Lab Results  Component Value Date   TRIG 87 07/06/2017   TRIG 117 06/07/2016   Lab Results  Component Value Date   CHOLHDL 3.4 07/06/2017   CHOLHDL 3.5 06/07/2016   No results found for: LDLDIRECT    Radiology: No results found.  EKG: SR rate 88 normal QT nonspecific ST changes    ASSESSMENT AND PLAN:   1. Palpitations:  Related to stomach meds. I find it hard to fathom that an acceptable H2 blocker, proton pump inhibitor or carafate cannot be found that does not disagree  with her. Will do 14 day event monitor while she takes med and see if she has atrial arrhythmias Echo to r/o structural heart disease. PRN inderal 10 mg for symptoms  2. HTN:  Well controlled.  Continue current medications and low sodium Dash type diet.   3. Arthritis:  Continue current meds f/u rheum 4. Asthma:  Avoid stimulants ? Use xopenex instead of albuterol  5. GI:  Post gastric bypass and hernia repair f/u GI for better Rx of GERD/Reflux   Signed: Jenkins Rouge 08/25/2019, 9:02 AM

## 2019-08-25 ENCOUNTER — Ambulatory Visit: Payer: 59 | Admitting: Cardiovascular Disease

## 2019-08-25 ENCOUNTER — Other Ambulatory Visit: Payer: Self-pay

## 2019-08-25 ENCOUNTER — Ambulatory Visit (INDEPENDENT_AMBULATORY_CARE_PROVIDER_SITE_OTHER): Payer: 59

## 2019-08-25 ENCOUNTER — Encounter: Payer: Self-pay | Admitting: Cardiovascular Disease

## 2019-08-25 VITALS — BP 111/61 | HR 62 | Ht 64.0 in | Wt 243.8 lb

## 2019-08-25 DIAGNOSIS — R002 Palpitations: Secondary | ICD-10-CM

## 2019-08-25 DIAGNOSIS — Z8249 Family history of ischemic heart disease and other diseases of the circulatory system: Secondary | ICD-10-CM | POA: Diagnosis not present

## 2019-08-25 MED ORDER — PROPRANOLOL HCL 10 MG PO TABS: 10.0000 mg | ORAL_TABLET | Freq: Every day | ORAL | 1 refills | Status: DC | PRN

## 2019-08-25 MED FILL — PROPRANOLOL HCL 10 MG TAB: 10 | 90 days supply | Qty: 90 | Fill #0

## 2019-08-25 NOTE — Patient Instructions (Signed)
Medication Instructions:  Take Inderal 10 mg daily as needed for palpitations  *If you need a refill on your cardiac medications before your next appointment, please call your pharmacy*   Lab Work: None today If you have labs (blood work) drawn today and your tests are completely normal, you will receive your results only by: Marland Kitchen MyChart Message (if you have MyChart) OR . A paper copy in the mail If you have any lab test that is abnormal or we need to change your treatment, we will call you to review the results.   Testing/Procedures: Your physician has requested that you have an echocardiogram. Echocardiography is a painless test that uses sound waves to create images of your heart. It provides your doctor with information about the size and shape of your heart and how well your heart's chambers and valves are working. This procedure takes approximately one hour. There are no restrictions for this procedure.   Your physician has recommended that you wear an event monitor Zio for 14 days. Event monitors are medical devices that record the heart's electrical activity. Doctors most often [place] these monitors to diagnose arrhythmias. Arrhythmias are problems with the speed or rhythm of the heartbeat. The monitor is a small, portable device. You can wear one while you do your normal daily activities. This is usually used to diagnose what is causing palpitations/syncope (passing out).     Follow-Up: At [facility] you and your health needs are our priority.  As part of our continuing mission to provide you with exceptional heart care, we have created designated Provider Care Teams.  These Care Teams include your primary Cardiologist (physician) and Advanced Practice Providers (APPs -  Physician Assistants and Nurse Practitioners) who all work together to provide you with the care you need, when you need it.  We recommend signing up for the patient portal called "MyChart".  Sign up information is  provided on this After Visit Summary.  MyChart is used to connect with patients for Virtual Visits (Telemedicine).  Patients are able to view lab/test results, encounter notes, upcoming appointments, etc.  Non-urgent messages can be sent to your provider as well.   To learn more about what you can do with MyChart, go to [email or web address].    Your next appointment:  Follow up to be determined after tests     Thank you for choosing [facility] !

## 2019-09-09 ENCOUNTER — Other Ambulatory Visit: Payer: Self-pay

## 2019-09-09 ENCOUNTER — Ambulatory Visit (HOSPITAL_COMMUNITY)
Admission: RE | Admit: 2019-09-09 | Discharge: 2019-09-09 | Disposition: A | Discharge status: Home / Self Care | Payer: 59 | Source: Ambulatory Visit | Attending: Cardiovascular Disease | Admitting: Cardiovascular Disease

## 2019-09-09 DIAGNOSIS — R002 Palpitations: Secondary | ICD-10-CM | POA: Diagnosis not present

## 2019-09-09 NOTE — Progress Notes (Signed)
*  PRELIMINARY RESULTS* Echocardiogram 2D Echocardiogram has been performed.  Monica Hernandez 09/09/2019, 9:01 AM

## 2019-09-23 DIAGNOSIS — R002 Palpitations: Secondary | ICD-10-CM | POA: Diagnosis not present

## 2019-09-27 ENCOUNTER — Other Ambulatory Visit: Payer: Self-pay

## 2019-10-21 ENCOUNTER — Encounter (HOSPITAL_COMMUNITY): Payer: Self-pay

## 2019-11-15 ENCOUNTER — Telehealth: Payer: 59 | Admitting: Nurse Practitioner

## 2019-11-15 DIAGNOSIS — J019 Acute sinusitis, unspecified: Secondary | ICD-10-CM

## 2019-11-15 MED ORDER — AMOXICILLIN-POT CLAVULANATE 875-125 MG PO TABS: 1.0000 | ORAL_TABLET | Freq: Two times a day (BID) | ORAL | 0 refills | Status: DC

## 2019-11-15 NOTE — Progress Notes (Signed)

## 2019-12-09 DIAGNOSIS — K912 Postsurgical malabsorption, not elsewhere classified: Secondary | ICD-10-CM | POA: Diagnosis not present

## 2019-12-09 DIAGNOSIS — R69 Illness, unspecified: Secondary | ICD-10-CM | POA: Diagnosis not present

## 2019-12-09 DIAGNOSIS — E669 Obesity, unspecified: Secondary | ICD-10-CM | POA: Diagnosis not present

## 2019-12-09 DIAGNOSIS — R5383 Other fatigue: Secondary | ICD-10-CM | POA: Diagnosis not present

## 2019-12-09 DIAGNOSIS — Z9884 Bariatric surgery status: Secondary | ICD-10-CM | POA: Diagnosis not present

## 2019-12-24 ENCOUNTER — Other Ambulatory Visit: Payer: Self-pay

## 2019-12-24 ENCOUNTER — Encounter: Payer: Self-pay | Admitting: Emergency Medicine

## 2019-12-24 ENCOUNTER — Ambulatory Visit
Admission: EM | Admit: 2019-12-24 | Discharge: 2019-12-24 | Disposition: A | Discharge status: Home / Self Care | Payer: 59 | Attending: Emergency Medicine | Admitting: Emergency Medicine

## 2019-12-24 DIAGNOSIS — J069 Acute upper respiratory infection, unspecified: Secondary | ICD-10-CM

## 2019-12-24 DIAGNOSIS — H5213 Myopia, bilateral: Secondary | ICD-10-CM | POA: Diagnosis not present

## 2019-12-24 DIAGNOSIS — Z1152 Encounter for screening for COVID-19: Secondary | ICD-10-CM | POA: Diagnosis not present

## 2019-12-24 MED ORDER — PREDNISONE 20 MG PO TABS: 20.0000 mg | ORAL_TABLET | Freq: Two times a day (BID) | ORAL | 0 refills | Status: AC

## 2019-12-24 NOTE — ED Triage Notes (Addendum)
Headache, bilateral ear pressure, nasal congestion/ drainage and feels lightheaded that started last night

## 2019-12-24 NOTE — Discharge Instructions (Signed)
COVID testing ordered.  It will take between 5-7 days for test results.  Someone will contact you regarding abnormal results.    In the meantime: You should remain isolated in your home for 10 days from symptom onset AND greater than 72 hours after symptoms resolution (absence of fever without the use of fever-reducing medication and improvement in respiratory symptoms), whichever is longer Get plenty of rest and push fluids Zyrtec d for congestion and runny nose Use medications daily for symptom relief Use OTC medications like ibuprofen or tylenol as needed fever or pain Call or go to the ED if you have any new or worsening symptoms such as fever, cough, shortness of breath, chest tightness, chest pain, turning blue, changes in mental status, etc..Marland Kitchen

## 2019-12-24 NOTE — ED Provider Notes (Signed)
[facility]   [id number] 12/24/19 Arrival Time: 1138   CC: COVID symptoms  SUBJECTIVE: History from: patient.  Monica Hernandez is a 44 y.o. female who presents with headache, bilateral ear pressure, nasal congestion/ drainage, and felt lightheadedness x 1 day.  Denies sick exposure to COVID, flu or strep.  However, works with general public.  Has tried OTC medications without relief.  Denies aggravating factors.  Reports previous symptoms in the past.   Denies fever, chills, sore throat, SOB, wheezing, chest pain, nausea, changes in bowel or bladder habits.     ROS: As per HPI.  All other pertinent ROS negative.     Past Medical History:  Diagnosis Date  . Allergy   . Anemia   . Asthma   . Eczema   . GERD (gastroesophageal reflux disease)   . History of kidney stones   . Hypertension   . Kidney stone   . Mitral valve prolapse    was told as a kid that she had this but during ECHO for eval, ECHO did not indicate this , see 08-25-16 epic result   . PONV (postoperative nausea and vomiting)    epidural with c section caused N/V   . Pre-diabetes   . Recurrent upper respiratory infection (URI)   . Ulcer    Past Surgical History:  Procedure Laterality Date  . ABDOMINAL HYSTERECTOMY    . ADENOIDECTOMY    . ADENOIDECTOMY, TONSILLECTOMY AND MYRINGOTOMY WITH TUBE PLACEMENT    . CESAREAN SECTION    . FRACTURE SURGERY    . GASTRIC ROUX-EN-Y N/A 03/31/2017   Procedure: LAPAROSCOPIC ROUX-EN-Y GASTRIC BYPASS WITH UPPER ENDOSCOPY;  Surgeon: Sheliah Hatch, De Blanch, MD;  Location: [facility];  Service: General;  Laterality: N/A;  . LEG SURGERY Left   . TONSILLECTOMY    . TUBAL LIGATION     Allergies  Allergen Reactions  . Contrave [Naltrexone-Bupropion Hcl Er] Nausea And Vomiting    Pt states it causes bloody stool, bloody vomit, and heart palpitations  . Erythromycin Nausea And Vomiting and Rash    Wheezing   . Shellfish Allergy Anaphylaxis  . Zithromax [Azithromycin]  Nausea And Vomiting, Rash and Other (See Comments)  . Belviq [Lorcaserin Hcl] Other (See Comments)    Causes changes in vision and respiratory issues  . Qsymia [Phentermine-Topiramate] Other (See Comments)    Causes change in vision and respiratory issues   . Saxenda [Liraglutide -Weight Management] Other (See Comments)    Causes fainting  . Allegra-D [Fexofenadine-Pseudoephed Er] Nausea And Vomiting  . Levocetirizine Other (See Comments)    headache  . Naltrexone Other (See Comments)    Bloody stools.   . Fish Allergy Rash  . Sulfa Antibiotics Rash   No current facility-administered medications on file prior to encounter.   Current Outpatient Medications on File Prior to Encounter  Medication Sig Dispense Refill  . acetaminophen (TYLENOL) 500 MG tablet Take 1,000 mg by mouth every 6 (six) hours as needed for mild pain or moderate pain.    Marland Kitchen albuterol (PROVENTIL HFA;VENTOLIN HFA) 108 (90 Base) MCG/ACT inhaler Inhale into the lungs every 6 (six) hours as needed for wheezing or shortness of breath.    Marland Kitchen albuterol (VENTOLIN HFA) 108 (90 Base) MCG/ACT inhaler Inhale 2 puffs into the lungs every 6 (six) hours as needed for wheezing or shortness of breath. 8 g 0  . amoxicillin-clavulanate (AUGMENTIN) 875-125 MG tablet Take 1 tablet by mouth 2 (two) times daily. 14 tablet 0  .  artificial tears (LACRILUBE) OINT ophthalmic ointment Place into both eyes at bedtime. 3.5 g 0  . azelastine (ASTELIN) 0.1 % nasal spray Place 2 sprays into both nostrils 2 (two) times daily as needed for rhinitis. 30 mL 2  . butalbital-acetaminophen-caffeine (FIORICET) 50-325-40 MG tablet Take 1-2 tablets by mouth every 4 (four) hours as needed for headache. Max 6 caps/day 20 tablet 0  . Calcium-Phosphorus-Vitamin D (CALCIUM/VITAMIN D3/ADULT GUMMY) 250-100-500 MG-MG-UNIT CHEW Chew 2 tablets by mouth 3 (three) times daily.    . Cholecalciferol 2000 units CAPS Take 2,000 Units by mouth daily.    . cyclobenzaprine (FLEXERIL)  10 MG tablet Take 1 tablet (10 mg total) by mouth at bedtime. 20 tablet 0  . dicyclomine (BENTYL) 10 MG capsule Take 1 capsule (10 mg total) by mouth 3 (three) times daily before meals. (Patient taking differently: Take 10 mg by mouth every morning. ) 270 capsule 3  . lansoprazole (PREVACID) 30 MG capsule TAKE 1 CAPSULE BY MOUTH DAILY. 90 capsule 0  . Multiple Vitamins-Minerals (BARIATRIC MULTIVITAMINS/IRON PO) Take 2 tablets by mouth 2 (two) times daily. Chewable tablets    . olopatadine (PATANOL) 0.1 % ophthalmic solution Place 1 drop into both eyes 2 (two) times daily. 5 mL 0  . propranolol (INDERAL) 10 MG tablet Take 1 tablet (10 mg total) by mouth daily as needed. 90 tablet 1  . [DISCONTINUED] Carbinoxamine Maleate 4 MG TABS Take 1 tablet (4 mg total) by mouth 3 (three) times daily as needed. 30 tablet 5   Social History   Socioeconomic History  . Marital status: Married    Spouse name: Not on file  . Number of children: 1  . Years of education: Not on file  . Highest education level: Not on file  Occupational History    Employer: [employer]    Comment: heart care  Tobacco Use  . Smoking status: Former Smoker    Types: Cigarettes    Quit date: 2010    Years since quitting: 11.7  . Smokeless tobacco: Never Used  . Tobacco comment: smoked since age 18 ; quit in 2010   Vaping Use  . Vaping Use: Never used  Substance and Sexual Activity  . Alcohol use: No  . Drug use: No  . Sexual activity: Yes    Partners: Male    Birth control/protection: Surgical  Other Topics Concern  . Not on file  Social History Narrative  . Not on file   Social Determinants of Health   Financial Resource Strain:   . Difficulty of Paying Living Expenses: Not on file  Food Insecurity:   . Worried About Programme researcher, broadcasting/film/video in the Last Year: Not on file  . Ran Out of Food in the Last Year: Not on file  Transportation Needs:   . Lack of Transportation (Medical): Not on file  . Lack of  Transportation (Non-Medical): Not on file  Physical Activity:   . Days of Exercise per Week: Not on file  . Minutes of Exercise per Session: Not on file  Stress:   . Feeling of Stress : Not on file  Social Connections:   . Frequency of Communication with Friends and Family: Not on file  . Frequency of Social Gatherings with Friends and Family: Not on file  . Attends Religious Services: Not on file  . Active Member of Clubs or Organizations: Not on file  . Attends Banker Meetings: Not on file  . Marital Status: Not on file  Intimate Partner Violence:   . Fear of Current or Ex-Partner: Not on file  . Emotionally Abused: Not on file  . Physically Abused: Not on file  . Sexually Abused: Not on file   Family History  Problem Relation Age of Onset  . Hypertension Mother   . Stroke Mother   . Thyroid cancer Mother   . Stroke Father   . Lung cancer Father   . Brain cancer Father   . Bone cancer Father   . Hypertension Sister   . Heart disease Maternal Grandmother   . Diabetes Maternal Grandmother   . Asthma Other   . Cancer Other   . Stomach cancer Neg Hx   . Colon cancer Neg Hx     OBJECTIVE:  Vitals:   12/24/19 1208 12/24/19 1209  BP:  126/84  Pulse:  72  Resp:  18  Temp:  98.3 F (36.8 C)  TempSrc:  Oral  SpO2:  98%  Weight: 238 lb (108 kg)   Height: 5\' 4"  (1.626 m)      General appearance: alert; mildly fatigued appearing, nontoxic; speaking in full sentences and tolerating own secretions HEENT: NCAT; Ears: EACs clear, TMs pearly gray; Eyes: PERRL.  EOM grossly intact. Nose: nares patent without rhinorrhea, Throat: oropharynx clear, tonsils non erythematous or enlarged, uvula midline  Neck: supple without LAD Lungs: unlabored respirations, symmetrical air entry; cough: absent; no respiratory distress; CTAB Heart: regular rate and rhythm.  Skin: warm and dry Psychological: alert and cooperative; normal mood and affect   ASSESSMENT & PLAN:  1.  Encounter for screening for COVID-19   2. Viral URI with cough     Meds ordered this encounter  Medications  . predniSONE (DELTASONE) 20 MG tablet    Sig: Take 1 tablet (20 mg total) by mouth 2 (two) times daily with a meal for 5 days.    Dispense:  10 tablet    Refill:  0    Order Specific Question:   Supervising Provider    Answer:   Eustace Moore   COVID testing ordered.  It will take between 5-7 days for test results.  Someone will contact you regarding abnormal results.    In the meantime: You should remain isolated in your home for 10 days from symptom onset AND greater than 72 hours after symptoms resolution (absence of fever without the use of fever-reducing medication and improvement in respiratory symptoms), whichever is longer Get plenty of rest and push fluids Prednisone for congestion and runny nose Use medications daily for symptom relief Use OTC medications like ibuprofen or tylenol as needed fever or pain Call or go to the ED if you have any new or worsening symptoms such as fever, cough, shortness of breath, chest tightness, chest pain, turning blue, changes in mental status, etc...   Reviewed expectations re: course of current medical issues. Questions answered. Outlined signs and symptoms indicating need for more acute intervention. Patient verbalized understanding. After Visit Summary given.         [[id number]], PA-C 12/24/19 1239

## 2019-12-25 LAB — NOVEL CORONAVIRUS, NAA: SARS-CoV-2, NAA: NOT DETECTED

## 2019-12-25 LAB — SARS-COV-2, NAA 2 DAY TAT

## 2019-12-29 ENCOUNTER — Telehealth: Payer: 59 | Admitting: Emergency Medicine

## 2019-12-29 DIAGNOSIS — J069 Acute upper respiratory infection, unspecified: Secondary | ICD-10-CM

## 2019-12-29 MED ORDER — FLUTICASONE PROPIONATE 50 MCG/ACT NA SUSP: 1.0000 | Freq: Every day | NASAL | 2 refills | Status: DC

## 2019-12-29 MED ORDER — BENZONATATE 100 MG PO CAPS: 100.0000 mg | ORAL_CAPSULE | Freq: Two times a day (BID) | ORAL | 0 refills | Status: DC | PRN

## 2019-12-29 NOTE — Progress Notes (Signed)
We are sorry you are not feeling well.  Here is how we plan to help!  Based on what you have shared with me, it looks like you may have a viral upper respiratory infection.  Upper respiratory infections are caused by a large number of viruses; however, rhinovirus is the most common cause.   Symptoms vary from person to person, with common symptoms including sore throat, cough, fatigue or lack of energy and feeling of general discomfort.  A low-grade fever of up to 100.4 may present, but is often uncommon.  Symptoms vary however, and are closely related to a person's age or underlying illnesses.  The most common symptoms associated with an upper respiratory infection are nasal discharge or congestion, cough, sneezing, headache and pressure in the ears and face.  These symptoms usually persist for about 3 to 10 days, but can last up to 2 weeks.  It is important to know that upper respiratory infections do not cause serious illness or complications in most cases.    Upper respiratory infections can be transmitted from person to person, with the most common method of transmission being a person's hands.  The virus is able to live on the skin and can infect other persons for up to 2 hours after direct contact.  Also, these can be transmitted when someone coughs or sneezes; thus, it is important to cover the mouth to reduce this risk.  To keep the spread of the illness at bay, good hand hygiene is very important.  This is an infection that is most likely caused by a virus. There are no specific treatments other than to help you with the symptoms until the infection runs its course.  We are sorry you are not feeling well.  Here is how we plan to help!   For nasal congestion, you may use an oral decongestants such as Mucinex D or if you have glaucoma or high blood pressure use plain Mucinex.  Saline nasal spray or nasal drops can help and can safely be used as often as needed for congestion.  For your congestion,  I have prescribed Fluticasone nasal spray one spray in each nostril twice a day  If you do not have a history of heart disease, hypertension, diabetes or thyroid disease, prostate/bladder issues or glaucoma, you may also use Sudafed to treat nasal congestion.  It is highly recommended that you consult with a pharmacist or your primary care physician to ensure this medication is safe for you to take.     If you have a cough, you may use cough suppressants such as Delsym and Robitussin.  If you have glaucoma or high blood pressure, you can also use Coricidin HBP.   For cough I have prescribed for you A prescription cough medication called Tessalon Perles 100 mg. You may take 1-2 capsules every 8 hours as needed for cough  If you have a sore or scratchy throat, use a saltwater gargle-  to  teaspoon of salt dissolved in a 4-ounce to 8-ounce glass of warm water.  Gargle the solution for approximately 15-30 seconds and then spit.  It is important not to swallow the solution.  You can also use throat lozenges/cough drops and Chloraseptic spray to help with throat pain or discomfort.  Warm or cold liquids can also be helpful in relieving throat pain.  For headache, pain or general discomfort, you can use Ibuprofen or Tylenol as directed.   Some authorities believe that zinc sprays or the use of   Echinacea may shorten the course of your symptoms.   HOME CARE . Only take medications as instructed by your medical team. . Be sure to drink plenty of fluids. Water is fine as well as fruit juices, sodas and electrolyte beverages. You may want to stay away from caffeine or alcohol. If you are nauseated, try taking small sips of liquids. How do you know if you are getting enough fluid? Your urine should be a pale yellow or almost colorless. . Get rest. . Taking a steamy shower or using a humidifier may help nasal congestion and ease sore throat pain. You can place a towel over your head and breathe in the steam  from hot water coming from a faucet. . Using a saline nasal spray works much the same way. . Cough drops, hard candies and sore throat lozenges may ease your cough. . Avoid close contacts especially the very young and the elderly . Cover your mouth if you cough or sneeze . Always remember to wash your hands.   GET HELP RIGHT AWAY IF: . You develop worsening fever. . If your symptoms do not improve within 10 days . You develop yellow or green discharge from your nose over 3 days. . You have coughing fits . You develop a severe head ache or visual changes. . You develop shortness of breath, difficulty breathing or start having chest pain . Your symptoms persist after you have completed your treatment plan  MAKE SURE YOU   Understand these instructions.  Will watch your condition.  Will get help right away if you are not doing well or get worse.   If your symptoms do not get better, you may need to be retested for COVID-19 as sometimes early in the course, you can have false negatives.   Your e-visit answers were reviewed by a board certified advanced clinical practitioner to complete your personal care plan. Depending upon the condition, your plan could have included both over the counter or prescription medications. Please review your pharmacy choice. If there is a problem, you may call our nursing hot line at and have the prescription routed to another pharmacy. Your safety is important to [place]. If you have drug allergies check your prescription carefully.   You can use MyChart to ask questions about today's visit, request a non-urgent call back, or ask for a work or school excuse for 24 hours related to this e-Visit. If it has been greater than 24 hours you will need to follow up with your provider, or enter a new e-Visit to address those concerns. You will get an e-mail in the next two days asking about your experience.  I hope that your e-visit has been valuable and will speed your  recovery. Thank you for using e-visits.  I spent 5-10 minutes reviewing the patient's medical chart and history.

## 2020-03-01 ENCOUNTER — Telehealth: Payer: 59 | Admitting: Orthopedic Surgery

## 2020-03-01 DIAGNOSIS — R059 Cough, unspecified: Secondary | ICD-10-CM

## 2020-03-01 MED ORDER — BENZONATATE 100 MG PO CAPS: 100.0000 mg | ORAL_CAPSULE | Freq: Three times a day (TID) | ORAL | 0 refills | Status: DC | PRN

## 2020-03-01 NOTE — Progress Notes (Signed)
We are sorry that you are not feeling well.  Here is how we plan to help!  Based on your presentation I believe you most likely have A cough due to a virus.  This is called viral bronchitis and is best treated by rest, plenty of fluids and control of the cough.  You may use Ibuprofen or Tylenol as directed to help your symptoms.     In addition you may use A prescription cough medication called Tessalon Perles 100mg . You may take 1-2 capsules every 8 hours as needed for your cough.  As your symptoms could be from Covid I strongly suggest you get tested. We are also starting to see flu viruses so this could be a cause as well.  From your responses in the eVisit questionnaire you describe inflammation in the upper respiratory tract which is causing a significant cough.  This is commonly called Bronchitis and has four common causes:    Allergies  Viral Infections  Acid Reflux  Bacterial Infection Allergies, viruses and acid reflux are treated by controlling symptoms or eliminating the cause. An example might be a cough caused by taking certain blood pressure medications. You stop the cough by changing the medication. Another example might be a cough caused by acid reflux. Controlling the reflux helps control the cough.  USE OF BRONCHODILATOR ("RESCUE") INHALERS: There is a risk from using your bronchodilator too frequently.  The risk is that over-reliance on a medication which only relaxes the muscles surrounding the breathing tubes can reduce the effectiveness of medications prescribed to reduce swelling and congestion of the tubes themselves.  Although you feel brief relief from the bronchodilator inhaler, your asthma may actually be worsening with the tubes becoming more swollen and filled with mucus.  This can delay other crucial treatments, such as oral steroid medications. If you need to use a bronchodilator inhaler daily, several times per day, you should discuss this with your provider.   There are probably better treatments that could be used to keep your asthma under control.     HOME CARE . Only take medications as instructed by your medical team. . Complete the entire course of an antibiotic. . Drink plenty of fluids and get plenty of rest. . Avoid close contacts especially the very young and the elderly . Cover your mouth if you cough or cough into your sleeve. . Always remember to wash your hands . A steam or ultrasonic humidifier can help congestion.   GET HELP RIGHT AWAY IF: . You develop worsening fever. . You become short of breath . You cough up blood. . Your symptoms persist after you have completed your treatment plan MAKE SURE YOU   Understand these instructions.  Will watch your condition.  Will get help right away if you are not doing well or get worse.  Your e-visit answers were reviewed by a board certified advanced clinical practitioner to complete your personal care plan.  Depending on the condition, your plan could have included both over the counter or prescription medications. If there is a problem please reply  once you have received a response from your provider. Your safety is important to .  If you have drug allergies check your prescription carefully.    You can use MyChart to ask questions about today's visit, request a non-urgent call back, or ask for a work or school excuse for 24 hours related to this e-Visit. If it has been greater than 24 hours you will need to follow  up with your provider, or enter a new e-Visit to address those concerns. You will get an e-mail in the next two days asking about your experience.  I hope that your e-visit has been valuable and will speed your recovery. Thank you for using e-visits.  Greater than 5 minutes, yet less than 10 minutes of time have been spent researching, coordinating and implementing care for this patient today.

## 2020-03-15 DIAGNOSIS — Z681 Body mass index (BMI) 19 or less, adult: Secondary | ICD-10-CM | POA: Diagnosis not present

## 2020-03-15 DIAGNOSIS — J22 Unspecified acute lower respiratory infection: Secondary | ICD-10-CM | POA: Diagnosis not present

## 2020-03-17 ENCOUNTER — Ambulatory Visit
Admission: EM | Admit: 2020-03-17 | Discharge: 2020-03-17 | Disposition: A | Discharge status: Home / Self Care | Payer: 59 | Attending: Family Medicine | Admitting: Family Medicine

## 2020-03-17 ENCOUNTER — Other Ambulatory Visit: Payer: Self-pay

## 2020-03-17 ENCOUNTER — Encounter: Payer: Self-pay | Admitting: Emergency Medicine

## 2020-03-17 DIAGNOSIS — J3489 Other specified disorders of nose and nasal sinuses: Secondary | ICD-10-CM | POA: Diagnosis not present

## 2020-03-17 DIAGNOSIS — R0981 Nasal congestion: Secondary | ICD-10-CM

## 2020-03-17 DIAGNOSIS — Z7689 Persons encountering health services in other specified circumstances: Secondary | ICD-10-CM | POA: Diagnosis not present

## 2020-03-17 DIAGNOSIS — J029 Acute pharyngitis, unspecified: Secondary | ICD-10-CM | POA: Diagnosis not present

## 2020-03-17 DIAGNOSIS — R059 Cough, unspecified: Secondary | ICD-10-CM | POA: Diagnosis not present

## 2020-03-17 DIAGNOSIS — B349 Viral infection, unspecified: Secondary | ICD-10-CM | POA: Diagnosis not present

## 2020-03-17 MED ORDER — PROMETHAZINE-DM 6.25-15 MG/5ML PO SYRP: 5.0000 mL | ORAL_SOLUTION | Freq: Four times a day (QID) | ORAL | 0 refills | Status: DC | PRN

## 2020-03-17 NOTE — ED Triage Notes (Signed)
Sore throat started Tuesday.  Has had covid test and was negative   Saw her doctor and was prescribed prednisone and doxycycline.  Medication has not helped.

## 2020-03-17 NOTE — Discharge Instructions (Addendum)
I have sent in cough syrup for you to take. This medication can make you sleepy. Do not drive while taking this medication.  Your COVID and Flu tests are pending.  You should self quarantine until the test results are back.    Take Tylenol or ibuprofen as needed for fever or discomfort.  Rest and keep yourself hydrated.    Follow-up with your primary care provider if your symptoms are not improving.     

## 2020-03-17 NOTE — ED Provider Notes (Signed)
[facility]   [id number] 03/17/20 Arrival Time: 1459   CC: COVID symptoms  SUBJECTIVE: History from: patient.  Monica Hernandez is a 45 y.o. female who presents with sore throat x 3 days. Reports that she was seen by PCP and was prescribed prednisone and doxycyline x 2 days ago. Reports negative Covid test after one day of symptoms. Denies sick exposure to COVID, flu or strep. Denies recent travel. Has negative history of Covid. Has not completed Covid vaccines. Sore throat is worse with swallowing.  Denies previous symptoms in the past. Denies fever, chills, fatigue, sinus pain, rhinorrhea, SOB, wheezing, chest pain, nausea, changes in bowel or bladder habits.    ROS: As per HPI.  All other pertinent ROS negative.     Past Medical History:  Diagnosis Date  . Allergy   . Anemia   . Asthma   . Eczema   . GERD (gastroesophageal reflux disease)   . History of kidney stones   . Hypertension   . Kidney stone   . Mitral valve prolapse    was told as a kid that she had this but during ECHO for eval, ECHO did not indicate this , see 08-25-16 epic result   . PONV (postoperative nausea and vomiting)    epidural with c section caused N/V   . Pre-diabetes   . Recurrent upper respiratory infection (URI)   . Ulcer    Past Surgical History:  Procedure Laterality Date  . ABDOMINAL HYSTERECTOMY    . ADENOIDECTOMY    . ADENOIDECTOMY, TONSILLECTOMY AND MYRINGOTOMY WITH TUBE PLACEMENT    . CESAREAN SECTION    . FRACTURE SURGERY    . GASTRIC ROUX-EN-Y N/A 03/31/2017   Procedure: LAPAROSCOPIC ROUX-EN-Y GASTRIC BYPASS WITH UPPER ENDOSCOPY;  Surgeon: Sheliah Hatch, De Blanch, MD;  Location: [facility];  Service: General;  Laterality: N/A;  . LEG SURGERY Left   . TONSILLECTOMY    . TUBAL LIGATION     Allergies  Allergen Reactions  . Contrave [Naltrexone-Bupropion Hcl Er] Nausea And Vomiting    Pt states it causes bloody stool, bloody vomit, and heart palpitations  . Erythromycin Nausea And  Vomiting and Rash    Wheezing   . Shellfish Allergy Anaphylaxis  . Zithromax [Azithromycin] Nausea And Vomiting, Rash and Other (See Comments)  . Belviq [Lorcaserin Hcl] Other (See Comments)    Causes changes in vision and respiratory issues  . Qsymia [Phentermine-Topiramate] Other (See Comments)    Causes change in vision and respiratory issues   . Saxenda [Liraglutide -Weight Management] Other (See Comments)    Causes fainting  . Allegra-D [Fexofenadine-Pseudoephed Er] Nausea And Vomiting  . Levocetirizine Other (See Comments)    headache  . Methylprednisolone Other (See Comments)    Low heart rate  . Naltrexone Other (See Comments)    Bloody stools.   . Fish Allergy Rash  . Sulfa Antibiotics Rash   No current facility-administered medications on file prior to encounter.   Current Outpatient Medications on File Prior to Encounter  Medication Sig Dispense Refill  . acetaminophen (TYLENOL) 500 MG tablet Take 1,000 mg by mouth every 6 (six) hours as needed for mild pain or moderate pain.    Marland Kitchen albuterol (PROVENTIL HFA;VENTOLIN HFA) 108 (90 Base) MCG/ACT inhaler Inhale into the lungs every 6 (six) hours as needed for wheezing or shortness of breath.    Marland Kitchen albuterol (VENTOLIN HFA) 108 (90 Base) MCG/ACT inhaler Inhale 2 puffs into the lungs every 6 (six) hours as needed  for wheezing or shortness of breath. 8 g 0  . amoxicillin-clavulanate (AUGMENTIN) 875-125 MG tablet Take 1 tablet by mouth 2 (two) times daily. 14 tablet 0  . artificial tears (LACRILUBE) OINT ophthalmic ointment Place into both eyes at bedtime. 3.5 g 0  . azelastine (ASTELIN) 0.1 % nasal spray Place 2 sprays into both nostrils 2 (two) times daily as needed for rhinitis. 30 mL 2  . benzonatate (TESSALON) 100 MG capsule Take 1 capsule (100 mg total) by mouth 3 (three) times daily as needed for cough. 20 capsule 0  . butalbital-acetaminophen-caffeine (FIORICET) 50-325-40 MG tablet Take 1-2 tablets by mouth every 4 (four) hours  as needed for headache. Max 6 caps/day 20 tablet 0  . Calcium-Phosphorus-Vitamin D (CALCIUM/VITAMIN D3/ADULT GUMMY) 250-100-500 MG-MG-UNIT CHEW Chew 2 tablets by mouth 3 (three) times daily.    . Cholecalciferol 2000 units CAPS Take 2,000 Units by mouth daily.    . cyclobenzaprine (FLEXERIL) 10 MG tablet Take 1 tablet (10 mg total) by mouth at bedtime. 20 tablet 0  . dicyclomine (BENTYL) 10 MG capsule Take 1 capsule (10 mg total) by mouth 3 (three) times daily before meals. (Patient taking differently: Take 10 mg by mouth every morning. ) 270 capsule 3  . fluticasone (FLONASE) 50 MCG/ACT nasal spray Place 1 spray into both nostrils daily. 16 g 2  . lansoprazole (PREVACID) 30 MG capsule TAKE 1 CAPSULE BY MOUTH DAILY. 90 capsule 0  . Multiple Vitamins-Minerals (BARIATRIC MULTIVITAMINS/IRON PO) Take 2 tablets by mouth 2 (two) times daily. Chewable tablets    . olopatadine (PATANOL) 0.1 % ophthalmic solution Place 1 drop into both eyes 2 (two) times daily. 5 mL 0  . propranolol (INDERAL) 10 MG tablet Take 1 tablet (10 mg total) by mouth daily as needed. 90 tablet 1  . [DISCONTINUED] Carbinoxamine Maleate 4 MG TABS Take 1 tablet (4 mg total) by mouth 3 (three) times daily as needed. 30 tablet 5   Social History   Socioeconomic History  . Marital status: Married    Spouse name: Not on file  . Number of children: 1  . Years of education: Not on file  . Highest education level: Not on file  Occupational History    Employer: [employer]    Comment: heart care  Tobacco Use  . Smoking status: Former Smoker    Types: Cigarettes    Quit date: 2010    Years since quitting: 12.0  . Smokeless tobacco: Never Used  . Tobacco comment: smoked since age 16 ; quit in 2010   Vaping Use  . Vaping Use: Never used  Substance and Sexual Activity  . Alcohol use: No  . Drug use: No  . Sexual activity: Yes    Partners: Male    Birth control/protection: Surgical  Other Topics Concern  . Not on file  Social  History Narrative  . Not on file   Social Determinants of Health   Financial Resource Strain: Not on file  Food Insecurity: Not on file  Transportation Needs: Not on file  Physical Activity: Not on file  Stress: Not on file  Social Connections: Not on file  Intimate Partner Violence: Not on file   Family History  Problem Relation Age of Onset  . Hypertension Mother   . Stroke Mother   . Thyroid cancer Mother   . Stroke Father   . Lung cancer Father   . Brain cancer Father   . Bone cancer Father   . Hypertension Sister   .  Heart disease Maternal Grandmother   . Diabetes Maternal Grandmother   . Asthma Other   . Cancer Other   . Stomach cancer Neg Hx   . Colon cancer Neg Hx     OBJECTIVE:  Vitals:   03/17/20 1515 03/17/20 1516  BP: 134/83   Resp: 18   Temp: (!) 97.3 F (36.3 C)   TempSrc: Oral   SpO2: 97%   Weight:  234 lb 12.8 oz (106.5 kg)  Height:  5\' 4"  (1.626 m)     General appearance: alert; appears fatigued, but nontoxic; speaking in full sentences and tolerating own secretions HEENT: NCAT; Ears: EACs clear, TMs pearly gray; Eyes: PERRL.  EOM grossly intact. Sinuses: nontender; Nose: nares patent with clear rhinorrhea, Throat: oropharynx erythematous, cobblestoning present, tonsils non erythematous or enlarged, uvula midline  Neck: supple without LAD Lungs: unlabored respirations, symmetrical air entry; cough: mild; no respiratory distress; CTAB Heart: regular rate and rhythm.  Radial pulses 2+ symmetrical bilaterally Skin: warm and dry Psychological: alert and cooperative; normal mood and affect  LABS:  No results found for this or any previous visit (from the past 24 hour(s)).   ASSESSMENT & PLAN:  1. Viral illness   2. Cough   3. Rhinorrhea   4. Sore throat   5. Nasal congestion     Meds ordered this encounter  Medications  . promethazine-dextromethorphan (PROMETHAZINE-DM) 6.25-15 MG/5ML syrup    Sig: Take 5 mLs by mouth 4 (four) times  daily as needed for cough.    Dispense:  118 mL    Refill:  0    Order Specific Question:   Supervising Provider    Answer:   05-12-1992 Merrilee Jansky   Continue current doxycycline and steroid regimen Promethazine cough syrup prescribed Sedation precautions given Continue supportive care at home COVID and flu testing ordered.  It will take between 2-3 days for test results. Someone will contact you regarding abnormal results.   Work note provided Patient should remain in quarantine until they have received Covid results.  If negative you may resume normal activities (go back to work/school) while practicing hand hygiene, social distance, and mask wearing.  If positive, patient should remain in quarantine for at least 5 days from symptom onset AND greater than 72 hours after symptoms resolution (absence of fever without the use of fever-reducing medication and improvement in respiratory symptoms), whichever is longer Get plenty of rest and push fluids Use OTC zyrtec for nasal congestion, runny nose, and/or sore throat Use OTC flonase for nasal congestion and runny nose Use medications daily for symptom relief Use OTC medications like ibuprofen or tylenol as needed fever or pain Call or go to the ED if you have any new or worsening symptoms such as fever, worsening cough, shortness of breath, chest tightness, chest pain, turning blue, changes in mental status.  Reviewed expectations re: course of current medical issues. Questions answered. Outlined signs and symptoms indicating need for more acute intervention. Patient verbalized understanding. After Visit Summary given.         [[id number]], NP 03/19/20 2047

## 2020-03-18 ENCOUNTER — Encounter (INDEPENDENT_AMBULATORY_CARE_PROVIDER_SITE_OTHER): Payer: Self-pay

## 2020-03-20 ENCOUNTER — Other Ambulatory Visit: Payer: Self-pay | Admitting: Emergency Medicine

## 2020-03-20 ENCOUNTER — Other Ambulatory Visit: Payer: Self-pay

## 2020-03-20 ENCOUNTER — Ambulatory Visit
Admission: EM | Admit: 2020-03-20 | Discharge: 2020-03-20 | Disposition: A | Discharge status: Home / Self Care | Payer: 59 | Attending: Emergency Medicine | Admitting: Emergency Medicine

## 2020-03-20 ENCOUNTER — Encounter: Payer: Self-pay | Admitting: Emergency Medicine

## 2020-03-20 DIAGNOSIS — J069 Acute upper respiratory infection, unspecified: Secondary | ICD-10-CM

## 2020-03-20 LAB — COVID-19, FLU A+B NAA
Influenza A, NAA: NOT DETECTED
Influenza B, NAA: NOT DETECTED
SARS-CoV-2, NAA: NOT DETECTED

## 2020-03-20 MED ORDER — PREDNISONE 10 MG PO TABS: 20.0000 mg | ORAL_TABLET | Freq: Every day | ORAL | 0 refills | Status: DC

## 2020-03-20 MED ORDER — BENZONATATE 100 MG PO CAPS: 100.0000 mg | ORAL_CAPSULE | Freq: Three times a day (TID) | ORAL | 0 refills | Status: DC

## 2020-03-20 MED FILL — predniSONE 10 MG TABS: 10 | 5 days supply | Qty: 10 | Fill #0

## 2020-03-20 NOTE — Discharge Instructions (Addendum)
COVID testing ordered.  It will take between 2-7 days for test results.  Someone will contact you regarding abnormal results.    Get plenty of rest and push fluids Tessalon Perles prescribed for cough Prednisone was prescribed Use medications daily for symptom relief Use OTC medications like ibuprofen or tylenol as needed fever or pain Call or go to the ED if you have any new or worsening symptoms such as fever, worsening cough, shortness of breath, chest tightness, chest pain, turning blue, changes in mental status, etc..Marland Kitchen

## 2020-03-20 NOTE — ED Triage Notes (Addendum)
Seen here Saturday w/ cough, congestion and sore throat since last Tuesday.  S/s are not any better and Now pt is unable to smell.  Pt prescribed doxycycline.  Pt has taken mucinex, Claritin and abx this morning.

## 2020-03-20 NOTE — ED Provider Notes (Signed)
[facility]   [id number] 03/20/20 Arrival Time: 3009   CC: COVID symptoms  SUBJECTIVE: History from: patient.  Monica Hernandez is a 45 y.o. female presented to the urgent care with a complaint of cough, congestion and sore throat since last Tuesday.  Was seen at the urgent care this past Saturday and was prescribed promethazine with mild relief.  Has tested for COVID and is awaiting COVID test result..  Denies known exposure to COVID, flu or strep.  Denies recent travel.  Denies of any aggravating factors.  Denies previous symptoms in the past.   Denies fever, chills, fatigue, sinus pain, rhinorrhea, sore throat, SOB, wheezing, chest pain, nausea, changes in bowel or bladder habits.     ROS: As per HPI.  All other pertinent ROS negative.      Past Medical History:  Diagnosis Date  . Allergy   . Anemia   . Asthma   . Eczema   . GERD (gastroesophageal reflux disease)   . History of kidney stones   . Hypertension   . Kidney stone   . Mitral valve prolapse    was told as a kid that she had this but during ECHO for eval, ECHO did not indicate this , see 08-25-16 epic result   . PONV (postoperative nausea and vomiting)    epidural with c section caused N/V   . Pre-diabetes   . Recurrent upper respiratory infection (URI)   . Ulcer    Past Surgical History:  Procedure Laterality Date  . ABDOMINAL HYSTERECTOMY    . ADENOIDECTOMY    . ADENOIDECTOMY, TONSILLECTOMY AND MYRINGOTOMY WITH TUBE PLACEMENT    . CESAREAN SECTION    . FRACTURE SURGERY    . GASTRIC ROUX-EN-Y N/A 03/31/2017   Procedure: LAPAROSCOPIC ROUX-EN-Y GASTRIC BYPASS WITH UPPER ENDOSCOPY;  Surgeon: Sheliah Hatch, De Blanch, MD;  Location: [facility];  Service: General;  Laterality: N/A;  . LEG SURGERY Left   . TONSILLECTOMY    . TUBAL LIGATION     Allergies  Allergen Reactions  . Contrave [Naltrexone-Bupropion Hcl Er] Nausea And Vomiting    Pt states it causes bloody stool, bloody vomit, and heart palpitations   . Erythromycin Nausea And Vomiting and Rash    Wheezing   . Shellfish Allergy Anaphylaxis  . Zithromax [Azithromycin] Nausea And Vomiting, Rash and Other (See Comments)  . Belviq [Lorcaserin Hcl] Other (See Comments)    Causes changes in vision and respiratory issues  . Qsymia [Phentermine-Topiramate] Other (See Comments)    Causes change in vision and respiratory issues   . Saxenda [Liraglutide -Weight Management] Other (See Comments)    Causes fainting  . Allegra-D [Fexofenadine-Pseudoephed Er] Nausea And Vomiting  . Levocetirizine Other (See Comments)    headache  . Methylprednisolone Other (See Comments)    Low heart rate  . Naltrexone Other (See Comments)    Bloody stools.   . Fish Allergy Rash  . Sulfa Antibiotics Rash   No current facility-administered medications on file prior to encounter.   Current Outpatient Medications on File Prior to Encounter  Medication Sig Dispense Refill  . acetaminophen (TYLENOL) 500 MG tablet Take 1,000 mg by mouth every 6 (six) hours as needed for mild pain or moderate pain.    Marland Kitchen albuterol (PROVENTIL HFA;VENTOLIN HFA) 108 (90 Base) MCG/ACT inhaler Inhale into the lungs every 6 (six) hours as needed for wheezing or shortness of breath.    Marland Kitchen albuterol (VENTOLIN HFA) 108 (90 Base) MCG/ACT inhaler Inhale 2 puffs  into the lungs every 6 (six) hours as needed for wheezing or shortness of breath. 8 g 0  . amoxicillin-clavulanate (AUGMENTIN) 875-125 MG tablet Take 1 tablet by mouth 2 (two) times daily. 14 tablet 0  . artificial tears (LACRILUBE) OINT ophthalmic ointment Place into both eyes at bedtime. 3.5 g 0  . azelastine (ASTELIN) 0.1 % nasal spray Place 2 sprays into both nostrils 2 (two) times daily as needed for rhinitis. 30 mL 2  . butalbital-acetaminophen-caffeine (FIORICET) 50-325-40 MG tablet Take 1-2 tablets by mouth every 4 (four) hours as needed for headache. Max 6 caps/day 20 tablet 0  . Calcium-Phosphorus-Vitamin D (CALCIUM/VITAMIN D3/ADULT  GUMMY) 250-100-500 MG-MG-UNIT CHEW Chew 2 tablets by mouth 3 (three) times daily.    . Cholecalciferol 2000 units CAPS Take 2,000 Units by mouth daily.    . cyclobenzaprine (FLEXERIL) 10 MG tablet Take 1 tablet (10 mg total) by mouth at bedtime. 20 tablet 0  . dicyclomine (BENTYL) 10 MG capsule Take 1 capsule (10 mg total) by mouth 3 (three) times daily before meals. (Patient taking differently: Take 10 mg by mouth every morning. ) 270 capsule 3  . fluticasone (FLONASE) 50 MCG/ACT nasal spray Place 1 spray into both nostrils daily. 16 g 2  . lansoprazole (PREVACID) 30 MG capsule TAKE 1 CAPSULE BY MOUTH DAILY. 90 capsule 0  . Multiple Vitamins-Minerals (BARIATRIC MULTIVITAMINS/IRON PO) Take 2 tablets by mouth 2 (two) times daily. Chewable tablets    . olopatadine (PATANOL) 0.1 % ophthalmic solution Place 1 drop into both eyes 2 (two) times daily. 5 mL 0  . promethazine-dextromethorphan (PROMETHAZINE-DM) 6.25-15 MG/5ML syrup Take 5 mLs by mouth 4 (four) times daily as needed for cough. 118 mL 0  . propranolol (INDERAL) 10 MG tablet Take 1 tablet (10 mg total) by mouth daily as needed. 90 tablet 1  . [DISCONTINUED] Carbinoxamine Maleate 4 MG TABS Take 1 tablet (4 mg total) by mouth 3 (three) times daily as needed. 30 tablet 5   Social History   Socioeconomic History  . Marital status: Married    Spouse name: Not on file  . Number of children: 1  . Years of education: Not on file  . Highest education level: Not on file  Occupational History    Employer: [employer]    Comment: heart care  Tobacco Use  . Smoking status: Former Smoker    Types: Cigarettes    Quit date: 2010    Years since quitting: 12.0  . Smokeless tobacco: Never Used  . Tobacco comment: smoked since age 84 ; quit in 2010   Vaping Use  . Vaping Use: Never used  Substance and Sexual Activity  . Alcohol use: No  . Drug use: No  . Sexual activity: Yes    Partners: Male    Birth control/protection: Surgical  Other Topics  Concern  . Not on file  Social History Narrative  . Not on file   Social Determinants of Health   Financial Resource Strain: Not on file  Food Insecurity: Not on file  Transportation Needs: Not on file  Physical Activity: Not on file  Stress: Not on file  Social Connections: Not on file  Intimate Partner Violence: Not on file   Family History  Problem Relation Age of Onset  . Hypertension Mother   . Stroke Mother   . Thyroid cancer Mother   . Stroke Father   . Lung cancer Father   . Brain cancer Father   . Bone cancer  Father   . Hypertension Sister   . Heart disease Maternal Grandmother   . Diabetes Maternal Grandmother   . Asthma Other   . Cancer Other   . Stomach cancer Neg Hx   . Colon cancer Neg Hx     OBJECTIVE:  Vitals:   03/20/20 0851 03/20/20 0852  BP:  118/83  Pulse:  83  Resp:  19  Temp:  98.4 F (36.9 C)  TempSrc:  Oral  SpO2:  96%  Weight: 234 lb (106.1 kg)   Height: 5\' 5"  (1.651 m)      General appearance: alert; appears fatigued, but nontoxic; speaking in full sentences and tolerating own secretions HEENT: NCAT; Ears: EACs clear, TMs pearly gray; Eyes: PERRL.  EOM grossly intact. Sinuses: nontender; Nose: nares patent without rhinorrhea, Throat: oropharynx clear, tonsils non erythematous or enlarged, uvula midline  Neck: supple without LAD Lungs: unlabored respirations, symmetrical air entry; cough: moderate ; no respiratory distress; CTAB Heart: regular rate and rhythm.  Radial pulses 2+ symmetrical bilaterally Skin: warm and dry Psychological: alert and cooperative; normal mood and affect  LABS:  No results found for this or any previous visit (from the past 24 hour(s)).   ASSESSMENT & PLAN:  1. Viral URI     Meds ordered this encounter  Medications  . benzonatate (TESSALON) 100 MG capsule    Sig: Take 1 capsule (100 mg total) by mouth every 8 (eight) hours.    Dispense:  21 capsule    Refill:  0  . predniSONE (DELTASONE) 10 MG  tablet    Sig: Take 2 tablets (20 mg total) by mouth daily for 5 days.    Dispense:  10 tablet    Refill:  0    Discharge Instructions   COVID testing ordered.  It will take between 2-7 days for test results.  Someone will contact you regarding abnormal results.    Get plenty of rest and push fluids Tessalon Perles prescribed for cough Prednisone was prescribed Use medications daily for symptom relief Use OTC medications like ibuprofen or tylenol as needed fever or pain Call or go to the ED if you have any new or worsening symptoms such as fever, worsening cough, shortness of breath, chest tightness, chest pain, turning blue, changes in mental status, etc...   Reviewed expectations re: course of current medical issues. Questions answered. Outlined signs and symptoms indicating need for more acute intervention. Patient verbalized understanding. After Visit Summary given.         , FNP 03/20/20 [phone]

## 2020-03-21 ENCOUNTER — Ambulatory Visit (HOSPITAL_COMMUNITY)
Admission: RE | Admit: 2020-03-21 | Discharge: 2020-03-21 | Disposition: A | Discharge status: Home / Self Care | Payer: 59 | Source: Ambulatory Visit | Attending: Physician Assistant | Admitting: Physician Assistant

## 2020-03-21 ENCOUNTER — Other Ambulatory Visit (HOSPITAL_COMMUNITY): Payer: Self-pay | Admitting: Physician Assistant

## 2020-03-21 DIAGNOSIS — Z681 Body mass index (BMI) 19 or less, adult: Secondary | ICD-10-CM | POA: Diagnosis not present

## 2020-03-21 DIAGNOSIS — R0602 Shortness of breath: Secondary | ICD-10-CM | POA: Diagnosis not present

## 2020-03-21 DIAGNOSIS — J22 Unspecified acute lower respiratory infection: Secondary | ICD-10-CM | POA: Insufficient documentation

## 2020-03-21 DIAGNOSIS — R059 Cough, unspecified: Secondary | ICD-10-CM | POA: Diagnosis not present

## 2020-03-31 ENCOUNTER — Encounter: Payer: Self-pay | Admitting: Physician Assistant

## 2020-03-31 ENCOUNTER — Telehealth: Payer: 59 | Admitting: Physician Assistant

## 2020-03-31 DIAGNOSIS — B3731 Acute candidiasis of vulva and vagina: Secondary | ICD-10-CM

## 2020-03-31 DIAGNOSIS — B373 Candidiasis of vulva and vagina: Secondary | ICD-10-CM

## 2020-03-31 MED ORDER — FLUCONAZOLE 150 MG PO TABS: 150.0000 mg | ORAL_TABLET | Freq: Once | ORAL | 0 refills | Status: AC

## 2020-03-31 NOTE — Progress Notes (Signed)
We are sorry that you are not feeling well. Here is how we plan to help! Based on what you shared with me it looks like you: May have a yeast vaginosis  Vaginosis is an inflammation of the vagina that can result in discharge, itching and pain. The cause is usually a change in the normal balance of vaginal bacteria or an infection. Vaginosis can also result from reduced estrogen levels after menopause.  The most common causes of vaginosis are:   Bacterial vaginosis which results from an overgrowth of one on several organisms that are normally present in your vagina.   Yeast infections which are caused by a naturally occurring fungus called candida.   Vaginal atrophy (atrophic vaginosis) which results from the thinning of the vagina from reduced estrogen levels after menopause.   Trichomoniasis which is caused by a parasite and is commonly transmitted by sexual intercourse.  Factors that increase your risk of developing vaginosis include: . Medications, such as antibiotics and steroids . Uncontrolled diabetes . Use of hygiene products such as bubble bath, vaginal spray or vaginal deodorant . Douching . Wearing damp or tight-fitting clothing . Using an intrauterine device (IUD) for birth control . Hormonal changes, such as those associated with pregnancy, birth control pills or menopause . Sexual activity . Having a sexually transmitted infection  Your treatment plan is A single Diflucan (fluconazole) 150mg tablet once.  I have electronically sent this prescription into the pharmacy that you have chosen.  Be sure to take all of the medication as directed. Stop taking any medication if you develop a rash, tongue swelling or shortness of breath. Mothers who are breast feeding should consider pumping and discarding their breast milk while on these antibiotics. However, there is no consensus that infant exposure at these doses would be harmful.  Remember that medication creams can weaken latex  condoms. .   HOME CARE:  Good hygiene may prevent some types of vaginosis from recurring and may relieve some symptoms:  . Avoid baths, hot tubs and whirlpool spas. Rinse soap from your outer genital area after a shower, and dry the area well to prevent irritation. Don't use scented or harsh soaps, such as those with deodorant or antibacterial action. . Avoid irritants. These include scented tampons and pads. . Wipe from front to back after using the toilet. Doing so avoids spreading fecal bacteria to your vagina.  Other things that may help prevent vaginosis include:  . Don't douche. Your vagina doesn't require cleansing other than normal bathing. Repetitive douching disrupts the normal organisms that reside in the vagina and can actually increase your risk of vaginal infection. Douching won't clear up a vaginal infection. . Use a latex condom. Both female and female latex condoms may help you avoid infections spread by sexual contact. . Wear cotton underwear. Also wear pantyhose with a cotton crotch. If you feel comfortable without it, skip wearing underwear to bed. Yeast thrives in moist environments Your symptoms should improve in the next day or two.  GET HELP RIGHT AWAY IF:  . You have pain in your lower abdomen ( pelvic area or over your ovaries) . You develop nausea or vomiting . You develop a fever . Your discharge changes or worsens . You have persistent pain with intercourse . You develop shortness of breath, a rapid pulse, or you faint.  These symptoms could be signs of problems or infections that need to be evaluated by a medical provider now.  MAKE SURE YOU      Understand these instructions.  Will watch your condition.  Will get help right away if you are not doing well or get worse.  Your e-visit answers were reviewed by a board certified advanced clinical practitioner to complete your personal care plan. Depending upon the condition, your plan could have included  both over the counter or prescription medications. Please review your pharmacy choice to make sure that you have choses a pharmacy that is open for you to pick up any needed prescription, Your safety is important to us. If you have drug allergies check your prescription carefully.   You can use MyChart to ask questions about today's visit, request a non-urgent call back, or ask for a work or school excuse for 24 hours related to this e-Visit. If it has been greater than 24 hours you will need to follow up with your provider, or enter a new e-Visit to address those concerns. You will get a MyChart message within the next two days asking about your experience. I hope that your e-visit has been valuable and will speed your recovery.  I spent 5-10 minutes on review and completion of this note- Chaka Jefferys PAC  

## 2020-06-14 ENCOUNTER — Other Ambulatory Visit: Payer: Self-pay | Admitting: Nurse Practitioner

## 2020-06-19 ENCOUNTER — Other Ambulatory Visit (HOSPITAL_COMMUNITY): Payer: Self-pay | Admitting: Nurse Practitioner

## 2020-06-19 DIAGNOSIS — Z1231 Encounter for screening mammogram for malignant neoplasm of breast: Secondary | ICD-10-CM

## 2020-06-21 NOTE — Progress Notes (Signed)
Cardiology Office Note    Date:  06/23/2020   ID:  Monica Hernandez, DOB November 15, 1975, MRN [id number]  PCP:  Elfredia Nevins, MD  Cardiologist: Charlton Haws, MD    Chief Complaint  Patient presents with  . Follow-up    Annual Visit; Worsening Palpitations    History of Present Illness:    Monica Hernandez is a 45 y.o. female with past medical history of palpitations, GERD and history of gastric bypass who presents to the office today for annual follow-up.  She was last examined by Dr. Eden Emms in 08/2019 and reported experiencing intermittent palpitations which would typically occur when taking PPI therapy. A 14-day event monitor along with echocardiogram were recommended for further evaluation. Her echocardiogram showed a preserved EF of 55 to 60% with no regional wall motion abnormalities. RV function was normal along with PASP normal at 18.8 mmHg. She had trivial MR but no significant valve abnormalities. Her event monitor showed normal sinus rhythm with an average heart rate of 74 bpm and no significant arrhythmias. She did have occasional PAC's and PVC's but overall less than 1% burden. Also previously underwent Calcium Scoring with calcium score of 0.   In talking with the patient today, she reports having intermittent palpitations which typically occur at rest. She reports feeling as if her heart is "thumping" and experiences associated dyspnea and dizziness when this occurs. Symptoms typically last for a few minutes and spontaneously resolve. She is active at baseline as she works at [employer] and also does extensive chores around the house and denies any anginal symptoms with this. Was just scrubbing tile floors yesterday along with weed eating in the yard without any symptoms at that time but did have palpitations upon eating dinner last night. She consumes a significant amount of coffee (at least 3 pots of coffee a day along with intermittent tea and sodas). No alcohol use. Reports  previously having palpitations as a teenager and was on Lopressor but this was discontinued for unclear reasons. She has an Rx for Propanolol but has not utilized this recently as she was told to use this only if her heart rate became elevated   Past Medical History:  Diagnosis Date  . Allergy   . Anemia   . Asthma   . Eczema   . GERD (gastroesophageal reflux disease)   . History of kidney stones   . Hypertension   . Kidney stone   . Mitral valve prolapse    was told as a kid that she had this but during ECHO for eval, ECHO did not indicate this , see 08-25-16 epic result   . PONV (postoperative nausea and vomiting)    epidural with c section caused N/V   . Pre-diabetes   . Recurrent upper respiratory infection (URI)   . Ulcer     Past Surgical History:  Procedure Laterality Date  . ABDOMINAL HYSTERECTOMY    . ADENOIDECTOMY    . ADENOIDECTOMY, TONSILLECTOMY AND MYRINGOTOMY WITH TUBE PLACEMENT    . CESAREAN SECTION    . FRACTURE SURGERY    . GASTRIC ROUX-EN-Y N/A 03/31/2017   Procedure: LAPAROSCOPIC ROUX-EN-Y GASTRIC BYPASS WITH UPPER ENDOSCOPY;  Surgeon: Sheliah Hatch, De Blanch, MD;  Location: [facility];  Service: General;  Laterality: N/A;  . LEG SURGERY Left   . TONSILLECTOMY    . TUBAL LIGATION      Current Medications: Outpatient Medications Prior to Visit  Medication Sig Dispense Refill  . acetaminophen (TYLENOL) 500 MG tablet  Take 1,000 mg by mouth every 6 (six) hours as needed for mild pain or moderate pain.    Marland Kitchen albuterol (VENTOLIN HFA) 108 (90 Base) MCG/ACT inhaler Inhale 2 puffs into the lungs every 6 (six) hours as needed for wheezing or shortness of breath. 8 g 0  . azelastine (ASTELIN) 0.1 % nasal spray Place 2 sprays into both nostrils 2 (two) times daily as needed for rhinitis. 30 mL 2  . butalbital-acetaminophen-caffeine (FIORICET) 50-325-40 MG tablet Take 1-2 tablets by mouth every 4 (four) hours as needed for headache. Max 6 caps/day 20 tablet 0  . fluticasone  (FLONASE) 50 MCG/ACT nasal spray Place 1 spray into both nostrils daily. 16 g 2  . Multiple Vitamins-Minerals (BARIATRIC MULTIVITAMINS/IRON PO) Take 2 tablets by mouth 2 (two) times daily. Chewable tablets    . albuterol (PROVENTIL HFA;VENTOLIN HFA) 108 (90 Base) MCG/ACT inhaler Inhale into the lungs every 6 (six) hours as needed for wheezing or shortness of breath.    . propranolol (INDERAL) 10 MG tablet Take 1 tablet (10 mg total) by mouth daily as needed. 90 tablet 1  . benzonatate (TESSALON) 100 MG capsule TAKE 1 CAPSULE (100 MG TOTAL) BY MOUTH EVERY 8 (EIGHT) HOURS. 21 capsule 0  . Calcium-Phosphorus-Vitamin D (CALCIUM/VITAMIN D3/ADULT GUMMY) 250-100-500 MG-MG-UNIT CHEW Chew 2 tablets by mouth 3 (three) times daily.    . Cholecalciferol 2000 units CAPS Take 2,000 Units by mouth daily.    Marland Kitchen dicyclomine (BENTYL) 10 MG capsule Take 1 capsule (10 mg total) by mouth 3 (three) times daily before meals. (Patient taking differently: Take 10 mg by mouth every morning. ) 270 capsule 3  . olopatadine (PATANOL) 0.1 % ophthalmic solution Place 1 drop into both eyes 2 (two) times daily. 5 mL 0  . predniSONE (DELTASONE) 10 MG tablet TAKE 2 TABLETS (20 MG TOTAL) BY MOUTH DAILY FOR 5 DAYS. 10 tablet 0  . promethazine-dextromethorphan (PROMETHAZINE-DM) 6.25-15 MG/5ML syrup Take 5 mLs by mouth 4 (four) times daily as needed for cough. 118 mL 0   No facility-administered medications prior to visit.     Allergies:   Contrave [naltrexone-bupropion hcl er], Erythromycin, Shellfish allergy, Zithromax [azithromycin], Belviq [lorcaserin hcl], Qsymia [phentermine-topiramate], Saxenda [liraglutide -weight management], Allegra-d [fexofenadine-pseudoephed er], Levocetirizine, Methylprednisolone, Naltrexone, Fish allergy, and Sulfa antibiotics   Social History   Socioeconomic History  . Marital status: Married    Spouse name: Not on file  . Number of children: 1  . Years of education: Not on file  . Highest education  level: Not on file  Occupational History    Employer: [employer]    Comment: heart care  Tobacco Use  . Smoking status: Former Smoker    Types: Cigarettes    Quit date: 2010    Years since quitting: 12.2  . Smokeless tobacco: Never Used  . Tobacco comment: smoked since age 2 ; quit in 2010   Vaping Use  . Vaping Use: Never used  Substance and Sexual Activity  . Alcohol use: No  . Drug use: No  . Sexual activity: Yes    Partners: Male    Birth control/protection: Surgical  Other Topics Concern  . Not on file  Social History Narrative  . Not on file   Social Determinants of Health   Financial Resource Strain: Not on file  Food Insecurity: Not on file  Transportation Needs: Not on file  Physical Activity: Not on file  Stress: Not on file  Social Connections: Not on file  Family History:  The patient's family history includes Asthma in an other family member; Bone cancer in her father; Brain cancer in her father; Cancer in an other family member; Diabetes in her maternal grandmother; Heart disease in her maternal grandmother; Hypertension in her mother and sister; Lung cancer in her father; Stroke in her father and mother; Thyroid cancer in her mother.   Review of Systems:   Please see the history of present illness.     General:  No chills, fever, night sweats or weight changes.  Cardiovascular:  No exertional chest pain, dyspnea on exertion, edema, orthopnea, paroxysmal nocturnal dyspnea. Positive for palpitations.  Dermatological: No rash, lesions/masses Respiratory: No cough, dyspnea Urologic: No hematuria, dysuria Abdominal:   No nausea, vomiting, diarrhea, bright red blood per rectum, melena, or hematemesis Neurologic:  No visual changes, wkns, changes in mental status. All other systems reviewed and are otherwise negative except as noted above.   Physical Exam:    VS:  BP 132/84   Pulse 82   SpO2 98%    General: Pleasant female appearing in no acute  distress. Head: Normocephalic, atraumatic. Neck: No carotid bruits. JVD not elevated.  Lungs: Respirations regular and unlabored, without wheezes or rales.  Heart: Regular rate and rhythm. No S3 or S4.  No murmur, no rubs, or gallops appreciated. Abdomen: Appears non-distended. No obvious abdominal masses. Msk:  Strength and tone appear normal for age. No obvious joint deformities or effusions. Extremities: No clubbing or cyanosis. Trace ankle edema bilaterally.  Distal pedal pulses are 2+ bilaterally. Neuro: Alert and oriented X 3. Moves all extremities spontaneously. No focal deficits noted. Psych:  Responds to questions appropriately with a normal affect. Skin: No rashes or lesions noted  Wt Readings from Last 3 Encounters:  03/20/20 234 lb (106.1 kg)  03/17/20 234 lb 12.8 oz (106.5 kg)  12/24/19 238 lb (108 kg)     Studies/Labs Reviewed:   EKG:  EKG is ordered today.  The ekg ordered today demonstrates NSR, HR 77 with no acute ST changes when compared to prior tracings.   Recent Labs: No results found for requested labs within last 8760 hours.   Lipid Panel    Component Value Date/Time   CHOL 169 07/06/2017 1421   TRIG 87 07/06/2017 1421   HDL 50 07/06/2017 1421   CHOLHDL 3.4 07/06/2017 1421   LDLCALC 102 (H) 07/06/2017 1421    Additional studies/ records that were reviewed today include:   CT Calcium Score: 11/2018 FINDINGS: Non-cardiac: See separate report from [facility].  Ascending aorta: Normal diameter 3.2 cm  Pericardium: Normal  Coronary arteries: No calcium noted  IMPRESSION: Coronary calcium score of 0.  Echocardiogram: 09/2019 IMPRESSIONS    1. Left ventricular ejection fraction, by estimation, is 55 to 60%. The  left ventricle has normal function. The left ventricle has no regional  wall motion abnormalities. Left ventricular diastolic parameters were  normal.  2. Right ventricular systolic function is normal. The right  ventricular  size is normal. There is normal pulmonary artery systolic pressure. The  estimated right ventricular systolic pressure is 18.8 mmHg.  3. Left atrial size was upper normal.  4. The mitral valve is grossly normal. Trivial mitral valve  regurgitation.  5. The aortic valve is tricuspid. Aortic valve regurgitation is not  visualized.  6. The inferior vena cava is dilated in size with >50% respiratory  variability, suggesting right atrial pressure of 8 mmHg.   Event Monitor: 08/2019 NSR Average HR 74  bpm Rare PAC/PVC < 1% beats No significant arrhythmias   Assessment:    1. Palpitations   2. PVC (premature ventricular contraction)   3. Chest pain of uncertain etiology      Plan:   In order of problems listed above:  1. Palpitations - Reports intermittent palpitations which typically occur at rest or during the night and spontaneously resolve. Does note associated dyspnea, chest pressure and dizziness when they occur. Prior monitor showed PAC's and PVC's and by her description, her current symptoms seem most consistent with those.  - She does consume a significant amount of caffeine and I suspect that is the trigger for her palpitations. She does have upcoming labs with her PCP and would plan to obtain a CBC, BMET and TSH to rule-out other causes.  - Discussed options for medical therapy and will plan to start Toprol-XL 25mg  daily (was on Lopressor in the past and tolerated well). Reviewed she could take a half tablet for the first week then go to a full tablet. If symptoms do not improve, would consider a repeat monitor and we also discussed using a Kardia device.   2. Chest Pain - Symptoms typically occur in association with her palpitations. No exertional chest pain. Given her calcium score of 0 by CT in 11/2018 and symptoms being atypical for angina by description, will focus on medical therapy for now.    Medication Adjustments/Labs and Tests Ordered: Current  medicines are reviewed at length with the patient today.  Concerns regarding medicines are outlined above.  Medication changes, Labs and Tests ordered today are listed in the Patient Instructions below. Patient Instructions  Medication Instructions:  STOP Inderal  START Toprol XL 25 mg daily   *If you need a refill on your cardiac medications before your next appointment, please call your pharmacy*   Lab Work: None today If you have labs (blood work) drawn today and your tests are completely normal, you will receive your results only by: Marland Kitchen. MyChart Message (if you have MyChart) OR . A paper copy in the mail If you have any lab test that is abnormal or we need to change your treatment, we will call you to review the results.   Testing/Procedures: None today   Follow-Up: At [facility], you and your health needs are our priority.  As part of our continuing mission to provide you with exceptional heart care, we have created designated Provider Care Teams.  These Care Teams include your primary Cardiologist (physician) and Advanced Practice Providers (APPs -  Physician Assistants and Nurse Practitioners) who all work together to provide you with the care you need, when you need it.  We recommend signing up for the patient portal called "MyChart".  Sign up information is provided on this After Visit Summary.  MyChart is used to connect with patients for Virtual Visits (Telemedicine).  Patients are able to view lab/test results, encounter notes, upcoming appointments, etc.  Non-urgent messages can be sent to your provider as well.   To learn more about what you can do with MyChart, go to [email or web address].    Your next appointment:   12 month(s)  The format for your next appointment:   In Person  Provider:   You may see Charlton HawsPeter Nishan, MD or the following Advanced Practice Provider on your designated Care Team:    Randall AnBrittany Cherrie Franca, PA-C    Other Instructions None       Signed, Ellsworth LennoxBrittany M Emiley Digiacomo, [place]-C  06/23/2020 8:29 AM  [facility] [address] [place], [place] [address] Phone: [phone] Fax: [phone]

## 2020-06-22 ENCOUNTER — Encounter: Payer: Self-pay | Admitting: Student

## 2020-06-22 ENCOUNTER — Other Ambulatory Visit (HOSPITAL_COMMUNITY): Payer: Self-pay

## 2020-06-22 ENCOUNTER — Other Ambulatory Visit: Payer: Self-pay

## 2020-06-22 ENCOUNTER — Ambulatory Visit: Payer: 59 | Admitting: Student

## 2020-06-22 VITALS — BP 132/84 | HR 82

## 2020-06-22 DIAGNOSIS — R002 Palpitations: Secondary | ICD-10-CM

## 2020-06-22 DIAGNOSIS — I493 Ventricular premature depolarization: Secondary | ICD-10-CM | POA: Diagnosis not present

## 2020-06-22 DIAGNOSIS — R079 Chest pain, unspecified: Secondary | ICD-10-CM | POA: Diagnosis not present

## 2020-06-22 DIAGNOSIS — R0789 Other chest pain: Secondary | ICD-10-CM

## 2020-06-22 MED ORDER — METOPROLOL SUCCINATE ER 25 MG PO TB24
25.0000 mg | ORAL_TABLET | Freq: Every day | ORAL | 3 refills | Status: DC
  Filled 2020-06-22: qty 90, 90d supply, fill #0
  Filled 2020-11-06: qty 90, 90d supply, fill #1
  Filled 2021-04-29: qty 90, 90d supply, fill #2

## 2020-06-22 NOTE — Patient Instructions (Signed)
Medication Instructions:  STOP Inderal  START Toprol XL 25 mg daily   *If you need a refill on your cardiac medications before your next appointment, please call your pharmacy*   Lab Work: None today If you have labs (blood work) drawn today and your tests are completely normal, you will receive your results only by: Marland Kitchen MyChart Message (if you have MyChart) OR . A paper copy in the mail If you have any lab test that is abnormal or we need to change your treatment, we will call you to review the results.   Testing/Procedures: None today   Follow-Up: At [facility], you and your health needs are our priority.  As part of our continuing mission to provide you with exceptional heart care, we have created designated Provider Care Teams.  These Care Teams include your primary Cardiologist (physician) and Advanced Practice Providers (APPs -  Physician Assistants and Nurse Practitioners) who all work together to provide you with the care you need, when you need it.  We recommend signing up for the patient portal called "MyChart".  Sign up information is provided on this After Visit Summary.  MyChart is used to connect with patients for Virtual Visits (Telemedicine).  Patients are able to view lab/test results, encounter notes, upcoming appointments, etc.  Non-urgent messages can be sent to your provider as well.   To learn more about what you can do with MyChart, go to [email or web address].    Your next appointment:   12 month(s)  The format for your next appointment:   In Person  Provider:   You may see Charlton Haws, MD or the following Advanced Practice Provider on your designated Care Team:    Randall An, [place]    Other Instructions None

## 2020-06-23 ENCOUNTER — Encounter: Payer: Self-pay | Admitting: Student

## 2020-07-03 ENCOUNTER — Encounter: Payer: Self-pay | Admitting: Nurse Practitioner

## 2020-07-03 ENCOUNTER — Other Ambulatory Visit (HOSPITAL_COMMUNITY): Payer: Self-pay

## 2020-07-03 ENCOUNTER — Other Ambulatory Visit: Payer: Self-pay

## 2020-07-03 ENCOUNTER — Ambulatory Visit: Payer: 59 | Admitting: Nurse Practitioner

## 2020-07-03 VITALS — BP 124/78 | HR 62 | Temp 98.8°F | Resp 20 | Ht 64.0 in | Wt 246.0 lb

## 2020-07-03 DIAGNOSIS — J45909 Unspecified asthma, uncomplicated: Secondary | ICD-10-CM | POA: Insufficient documentation

## 2020-07-03 DIAGNOSIS — R002 Palpitations: Secondary | ICD-10-CM | POA: Insufficient documentation

## 2020-07-03 DIAGNOSIS — Z7689 Persons encountering health services in other specified circumstances: Secondary | ICD-10-CM | POA: Diagnosis not present

## 2020-07-03 DIAGNOSIS — Z0001 Encounter for general adult medical examination with abnormal findings: Secondary | ICD-10-CM | POA: Insufficient documentation

## 2020-07-03 DIAGNOSIS — G43909 Migraine, unspecified, not intractable, without status migrainosus: Secondary | ICD-10-CM | POA: Insufficient documentation

## 2020-07-03 DIAGNOSIS — Z139 Encounter for screening, unspecified: Secondary | ICD-10-CM | POA: Diagnosis not present

## 2020-07-03 DIAGNOSIS — R7303 Prediabetes: Secondary | ICD-10-CM

## 2020-07-03 DIAGNOSIS — M1990 Unspecified osteoarthritis, unspecified site: Secondary | ICD-10-CM

## 2020-07-03 DIAGNOSIS — G43109 Migraine with aura, not intractable, without status migrainosus: Secondary | ICD-10-CM | POA: Diagnosis not present

## 2020-07-03 DIAGNOSIS — Z90710 Acquired absence of both cervix and uterus: Secondary | ICD-10-CM

## 2020-07-03 DIAGNOSIS — J309 Allergic rhinitis, unspecified: Secondary | ICD-10-CM | POA: Diagnosis not present

## 2020-07-03 DIAGNOSIS — J4530 Mild persistent asthma, uncomplicated: Secondary | ICD-10-CM

## 2020-07-03 DIAGNOSIS — Z889 Allergy status to unspecified drugs, medicaments and biological substances status: Secondary | ICD-10-CM | POA: Diagnosis not present

## 2020-07-03 DIAGNOSIS — Z0000 Encounter for general adult medical examination without abnormal findings: Secondary | ICD-10-CM | POA: Insufficient documentation

## 2020-07-03 MED ORDER — EPINEPHRINE 0.3 MG/0.3ML IJ SOAJ
0.3000 mg | INTRAMUSCULAR | 1 refills | Status: AC | PRN
  Filled 2020-07-03 – 2021-04-29 (×2): qty 2, 30d supply, fill #0

## 2020-07-03 NOTE — Assessment & Plan Note (Signed)
-  has epi-pen for shellfish and bee venom allergies

## 2020-07-03 NOTE — Patient Instructions (Signed)
Please have fasting labs drawn 2-3 days prior to your appointment so we can discuss the results during your office visit.  

## 2020-07-03 NOTE — Assessment & Plan Note (Signed)
-  will screen for HCV with next set of labs 

## 2020-07-03 NOTE — Assessment & Plan Note (Signed)
-  obtain records 

## 2020-07-03 NOTE — Assessment & Plan Note (Signed)
-  had total hysterectomy -no PAP required

## 2020-07-03 NOTE — Assessment & Plan Note (Signed)
-  uses tylenol -take PRN fioricet; states that she is out of commission for days when she takes migraine medicine -she has not tried triptans -allergy symptoms usually trigger her

## 2020-07-03 NOTE — Progress Notes (Signed)
New Patient Office Visit  Subjective:  Patient ID: Monica Hernandez, female    DOB: 11-20-1975  Age: 45 y.o. MRN: [id number]  CC:  Chief Complaint  Patient presents with  . New Patient (Initial Visit)    HPI Monica Hernandez presents for new patient visit. Transferring care from [place]. Last physical was April 2021. Last labs were performed at that time.  No acute concerns.  She states that she was told she was going through menopause and had arthritis, but she hasn't had a f/u appointment.    Past Medical History:  Diagnosis Date  . Allergy   . Anemia   . Asthma   . Eczema   . GERD (gastroesophageal reflux disease)   . History of kidney stones   . Hypertension   . Kidney stone   . Mitral valve prolapse    was told as a kid that she had this but during ECHO for eval, ECHO did not indicate this , see 08-25-16 epic result   . PONV (postoperative nausea and vomiting)    epidural with c section caused N/V   . Pre-diabetes   . Recurrent upper respiratory infection (URI)   . Ulcer     Past Surgical History:  Procedure Laterality Date  . ABDOMINAL HYSTERECTOMY     total  . ADENOIDECTOMY    . ADENOIDECTOMY, TONSILLECTOMY AND MYRINGOTOMY WITH TUBE PLACEMENT    . CESAREAN SECTION    . FRACTURE SURGERY    . GASTRIC ROUX-EN-Y N/A 03/31/2017   Procedure: LAPAROSCOPIC ROUX-EN-Y GASTRIC BYPASS WITH UPPER ENDOSCOPY;  Surgeon: Kieth Brightly, Arta Bruce, MD;  Location: [facility];  Service: General;  Laterality: N/A;  . HERNIA REPAIR     umbilical  . LEG SURGERY Left   . TONSILLECTOMY    . TUBAL LIGATION      Family History  Problem Relation Age of Onset  . Hypertension Mother   . Stroke Mother   . Thyroid cancer Mother   . Stroke Father   . Lung cancer Father   . Brain cancer Father   . Bone cancer Father   . Hypertension Sister   . Heart disease Maternal Grandmother   . Diabetes Maternal Grandmother   . Asthma Other   . Cancer Other   . Stomach cancer Neg Hx   .  Colon cancer Neg Hx     Social History   Socioeconomic History  . Marital status: Married    Spouse name: Not on file  . Number of children: 1  . Years of education: Not on file  . Highest education level: Not on file  Occupational History    Employer: [employer]    Comment: heart care    Comment: [employer]  Tobacco Use  . Smoking status: Former Smoker    Packs/day: 0.50    Years: 15.00    Pack years: 7.50    Types: Cigarettes    Quit date: 2010    Years since quitting: 12.3  . Smokeless tobacco: Never Used  . Tobacco comment: smoked since age 51 ; quit in 2010   Vaping Use  . Vaping Use: Never used  Substance and Sexual Activity  . Alcohol use: No  . Drug use: No  . Sexual activity: Yes    Partners: Male    Birth control/protection: Surgical  Other Topics Concern  . Not on file  Social History Narrative  . Not on file   Social Determinants of Health   Financial  Resource Strain: Not on file  Food Insecurity: Not on file  Transportation Needs: Not on file  Physical Activity: Not on file  Stress: Not on file  Social Connections: Not on file  Intimate Partner Violence: Not on file    ROS Review of Systems  Constitutional: Negative.   Respiratory: Negative.   Cardiovascular: Negative.   Musculoskeletal: Positive for arthralgias.  Psychiatric/Behavioral: Negative.     Objective:   Today's Vitals: BP 124/78   Pulse 62   Temp 98.8 F (37.1 C)   Resp 20   Ht 5' 4" (1.626 m)   Wt 246 lb (111.6 kg)   SpO2 97%   BMI 42.23 kg/m   Physical Exam Constitutional:      Appearance: Normal appearance. She is obese.  Cardiovascular:     Rate and Rhythm: Normal rate and regular rhythm.     Pulses: Normal pulses.     Heart sounds: Normal heart sounds.  Pulmonary:     Effort: Pulmonary effort is normal.     Breath sounds: Normal breath sounds.  Musculoskeletal:        General: Normal range of motion.  Neurological:     Mental Status: She is alert.   Psychiatric:        Mood and Affect: Mood normal.        Behavior: Behavior normal.        Thought Content: Thought content normal.        Judgment: Judgment normal.     Assessment & Plan:   Problem List Items Addressed This Visit      Cardiovascular and Mediastinum   Migraine    -uses tylenol -take PRN fioricet; states that she is out of commission for days when she takes migraine medicine -she has not tried triptans -allergy symptoms usually trigger her      Relevant Medications   EPINEPHrine 0.3 mg/0.3 mL IJ SOAJ injection     Respiratory   Chronic allergic rhinitis    -was previously referred to allergist -uses azelastine nasal spray      Asthma    -uses PRN albuterol        Musculoskeletal and Integument   Arthritis    -back, fingers, knees, elbows hurt -takes tylenol as needed; uses sparingly because it makes her sleepy        Other   Morbid obesity ([place])    -had roux-en-y bypass -takes multivitamin, took B12 supplement and D in the past, but her levels were elevated with supplementation      Encounter to establish care    -obtain records      Relevant Orders   CBC with Differential/Platelet   CMP14+EGFR   Lipid Panel With LDL/HDL Ratio   HCV Ab w/Rflx to Verification   TSH   VITAMIN D 25 Hydroxy (Vit-D Deficiency, Fractures)   Screening due    -will screen for HCV with next set of labs      Relevant Orders   HCV Ab w/Rflx to Verification   Palpitations - Primary    -followed by cardiology -thought to be related to caffeine consumption -monitor showed < 1% PAC/PVCs -takes metoprolol      Relevant Orders   CBC with Differential/Platelet   CMP14+EGFR   TSH   Multiple allergies    -has epi-pen for shellfish and bee venom allergies      Relevant Medications   EPINEPHrine 0.3 mg/0.3 mL IJ SOAJ injection   Status post hysterectomy    -had total  hysterectomy -no PAP required       Other Visit Diagnoses    Prediabetes        Relevant Orders   Hemoglobin A1c      Outpatient Encounter Medications as of 07/03/2020  Medication Sig  . acetaminophen (TYLENOL) 500 MG tablet Take 1,000 mg by mouth every 6 (six) hours as needed for mild pain or moderate pain.  Marland Kitchen albuterol (VENTOLIN HFA) 108 (90 Base) MCG/ACT inhaler Inhale 2 puffs into the lungs every 6 (six) hours as needed for wheezing or shortness of breath.  Marland Kitchen azelastine (ASTELIN) 0.1 % nasal spray Place 2 sprays into both nostrils 2 (two) times daily as needed for rhinitis.  Marland Kitchen butalbital-acetaminophen-caffeine (FIORICET) 50-325-40 MG tablet Take 1-2 tablets by mouth every 4 (four) hours as needed for headache. Max 6 caps/day  . EPINEPHrine 0.3 mg/0.3 mL IJ SOAJ injection Inject 0.3 mg into the muscle as needed for anaphylaxis.  . metoprolol succinate (TOPROL-XL) 25 MG 24 hr tablet Take 1 tablet (25 mg total) by mouth daily.  . Multiple Vitamins-Minerals (BARIATRIC MULTIVITAMINS/IRON PO) Take 2 tablets by mouth 2 (two) times daily. Chewable tablets  . [DISCONTINUED] Carbinoxamine Maleate 4 MG TABS Take 1 tablet (4 mg total) by mouth 3 (three) times daily as needed.  . [DISCONTINUED] fluticasone (FLONASE) 50 MCG/ACT nasal spray Place 1 spray into both nostrils daily.   No facility-administered encounter medications on file as of 07/03/2020.    Follow-up: Return in about 2 weeks (around 07/17/2020) for Physical Exam.   Noreene Larsson, NP

## 2020-07-03 NOTE — Assessment & Plan Note (Addendum)
-  followed by cardiology -thought to be related to caffeine consumption -monitor showed < 1% PAC/PVCs -takes metoprolol

## 2020-07-03 NOTE — Assessment & Plan Note (Signed)
-  uses PRN albuterol

## 2020-07-03 NOTE — Assessment & Plan Note (Signed)
-  back, fingers, knees, elbows hurt -takes tylenol as needed; uses sparingly because it makes her sleepy

## 2020-07-03 NOTE — Assessment & Plan Note (Signed)
-  was previously referred to allergist -uses azelastine nasal spray

## 2020-07-03 NOTE — Assessment & Plan Note (Signed)
-  had roux-en-y bypass -takes multivitamin, took B12 supplement and D in the past, but her levels were elevated with supplementation

## 2020-07-11 ENCOUNTER — Other Ambulatory Visit (HOSPITAL_COMMUNITY): Payer: Self-pay

## 2020-07-25 ENCOUNTER — Encounter: Payer: 59 | Admitting: Nurse Practitioner

## 2020-08-02 ENCOUNTER — Ambulatory Visit (HOSPITAL_COMMUNITY)
Admission: RE | Admit: 2020-08-02 | Discharge: 2020-08-02 | Disposition: A | Discharge status: Home / Self Care | Payer: 59 | Source: Ambulatory Visit | Attending: Nurse Practitioner | Admitting: Nurse Practitioner

## 2020-08-02 ENCOUNTER — Other Ambulatory Visit (HOSPITAL_COMMUNITY)
Admission: RE | Admit: 2020-08-02 | Discharge: 2020-08-02 | Disposition: A | Discharge status: Home / Self Care | Payer: 59 | Source: Ambulatory Visit | Attending: Nurse Practitioner | Admitting: Nurse Practitioner

## 2020-08-02 ENCOUNTER — Other Ambulatory Visit: Payer: Self-pay

## 2020-08-02 DIAGNOSIS — Z139 Encounter for screening, unspecified: Secondary | ICD-10-CM | POA: Insufficient documentation

## 2020-08-02 DIAGNOSIS — Z1231 Encounter for screening mammogram for malignant neoplasm of breast: Secondary | ICD-10-CM | POA: Diagnosis not present

## 2020-08-02 DIAGNOSIS — R7303 Prediabetes: Secondary | ICD-10-CM | POA: Insufficient documentation

## 2020-08-02 DIAGNOSIS — Z7689 Persons encountering health services in other specified circumstances: Secondary | ICD-10-CM | POA: Insufficient documentation

## 2020-08-02 DIAGNOSIS — R002 Palpitations: Secondary | ICD-10-CM | POA: Insufficient documentation

## 2020-08-02 LAB — CBC WITH DIFFERENTIAL/PLATELET
Abs Immature Granulocytes: 0.01 10*3/uL (ref 0.00–0.07)
Basophils Absolute: 0 10*3/uL (ref 0.0–0.1)
Basophils Relative: 0 %
Eosinophils Absolute: 0.2 10*3/uL (ref 0.0–0.5)
Eosinophils Relative: 3 %
HCT: 39.4 % (ref 36.0–46.0)
Hemoglobin: 12.7 g/dL (ref 12.0–15.0)
Immature Granulocytes: 0 %
Lymphocytes Relative: 39 %
Lymphs Abs: 2.1 10*3/uL (ref 0.7–4.0)
MCH: 29.4 pg (ref 26.0–34.0)
MCHC: 32.2 g/dL (ref 30.0–36.0)
MCV: 91.2 fL (ref 80.0–100.0)
Monocytes Absolute: 0.3 10*3/uL (ref 0.1–1.0)
Monocytes Relative: 6 %
Neutro Abs: 2.8 10*3/uL (ref 1.7–7.7)
Neutrophils Relative %: 52 %
Platelets: 229 10*3/uL (ref 150–400)
RBC: 4.32 MIL/uL (ref 3.87–5.11)
RDW: 13.5 % (ref 11.5–15.5)
WBC: 5.4 10*3/uL (ref 4.0–10.5)
nRBC: 0 % (ref 0.0–0.2)

## 2020-08-02 LAB — COMPREHENSIVE METABOLIC PANEL
ALT: 17 U/L (ref 0–44)
AST: 23 U/L (ref 15–41)
Albumin: 4.1 g/dL (ref 3.5–5.0)
Alkaline Phosphatase: 75 U/L (ref 38–126)
Anion gap: 7 (ref 5–15)
BUN: 16 mg/dL (ref 6–20)
CO2: 29 mmol/L (ref 22–32)
Calcium: 9.2 mg/dL (ref 8.9–10.3)
Chloride: 101 mmol/L (ref 98–111)
Creatinine, Ser: 0.72 mg/dL (ref 0.44–1.00)
GFR, Estimated: >60 mL/min (ref >60)
Glucose, Bld: 97 mg/dL (ref 70–99)
Potassium: 4.3 mmol/L (ref 3.5–5.1)
Sodium: 137 mmol/L (ref 135–145)
Total Bilirubin: 0.4 mg/dL (ref 0.3–1.2)
Total Protein: 7.4 g/dL (ref 6.5–8.1)

## 2020-08-02 LAB — LIPID PANEL
Cholesterol: 170 mg/dL (ref 0–200)
HDL: 73 mg/dL (ref >40)
LDL Cholesterol: 89 mg/dL (ref 0–99)
Total CHOL/HDL Ratio: 2.3 RATIO
Triglycerides: 40 mg/dL (ref <150)
VLDL: 8 mg/dL (ref 0–40)

## 2020-08-02 LAB — TSH: TSH: 1.934 u[IU]/mL (ref 0.350–4.500)

## 2020-08-02 LAB — HEMOGLOBIN A1C
Hgb A1c MFr Bld: 5.8 % — ABNORMAL HIGH (ref 4.8–5.6)
Mean Plasma Glucose: 120 mg/dL

## 2020-08-02 LAB — VITAMIN D 25 HYDROXY (VIT D DEFICIENCY, FRACTURES): Vit D, 25-Hydroxy: 30.77 ng/mL (ref 30–100)

## 2020-08-02 NOTE — Progress Notes (Signed)
Labs look great.

## 2020-08-03 LAB — HCV INTERPRETATION

## 2020-08-03 LAB — HCV AB W REFLEX TO QUANT PCR: HCV Ab: <0.1 s/co ratio (ref 0.0–0.9)

## 2020-08-07 ENCOUNTER — Encounter: Payer: Self-pay | Admitting: Nurse Practitioner

## 2020-08-07 ENCOUNTER — Other Ambulatory Visit (HOSPITAL_COMMUNITY): Payer: Self-pay | Admitting: General Practice

## 2020-08-07 ENCOUNTER — Other Ambulatory Visit: Payer: Self-pay

## 2020-08-07 ENCOUNTER — Ambulatory Visit (INDEPENDENT_AMBULATORY_CARE_PROVIDER_SITE_OTHER): Payer: 59 | Admitting: Nurse Practitioner

## 2020-08-07 ENCOUNTER — Other Ambulatory Visit: Payer: Self-pay | Admitting: Nurse Practitioner

## 2020-08-07 ENCOUNTER — Other Ambulatory Visit (HOSPITAL_COMMUNITY): Payer: Self-pay | Admitting: Nurse Practitioner

## 2020-08-07 DIAGNOSIS — Z7689 Persons encountering health services in other specified circumstances: Secondary | ICD-10-CM | POA: Diagnosis not present

## 2020-08-07 DIAGNOSIS — R928 Other abnormal and inconclusive findings on diagnostic imaging of breast: Secondary | ICD-10-CM

## 2020-08-07 NOTE — Progress Notes (Signed)
She has been set up for diagnostic mammogram and ultrasound to follow-up on her screening mammogram.

## 2020-08-07 NOTE — Assessment & Plan Note (Signed)
-  physical exam unremarkable 

## 2020-08-07 NOTE — Progress Notes (Signed)
Established Patient Office Visit  Subjective:  Patient ID: Monica Hernandez, female    DOB: 12-28-1975  Age: 45 y.o. MRN: [id number]  CC:  Chief Complaint  Patient presents with  . Annual Exam    HPI Monica Hernandez presents for physical exam; she is status post-hysterectomy, so no PAP today.  No acute concerns.   Past Medical History:  Diagnosis Date  . Allergy   . Anemia   . Asthma   . Eczema   . GERD (gastroesophageal reflux disease)   . History of kidney stones   . Hypertension   . Kidney stone   . Mitral valve prolapse    was told as a kid that she had this but during ECHO for eval, ECHO did not indicate this , see 08-25-16 epic result   . PONV (postoperative nausea and vomiting)    epidural with c section caused N/V   . Pre-diabetes   . Recurrent upper respiratory infection (URI)   . Ulcer     Past Surgical History:  Procedure Laterality Date  . ABDOMINAL HYSTERECTOMY     total  . ADENOIDECTOMY    . ADENOIDECTOMY, TONSILLECTOMY AND MYRINGOTOMY WITH TUBE PLACEMENT    . CESAREAN SECTION    . FRACTURE SURGERY    . GASTRIC ROUX-EN-Y N/A 03/31/2017   Procedure: LAPAROSCOPIC ROUX-EN-Y GASTRIC BYPASS WITH UPPER ENDOSCOPY;  Surgeon: Kieth Brightly, Arta Bruce, MD;  Location: [facility];  Service: General;  Laterality: N/A;  . HERNIA REPAIR     umbilical  . LEG SURGERY Left   . TONSILLECTOMY    . TUBAL LIGATION      Family History  Problem Relation Age of Onset  . Hypertension Mother   . Stroke Mother   . Thyroid cancer Mother   . Stroke Father   . Lung cancer Father   . Brain cancer Father   . Bone cancer Father   . Hypertension Sister   . Heart disease Maternal Grandmother   . Diabetes Maternal Grandmother   . Asthma Other   . Cancer Other   . Stomach cancer Neg Hx   . Colon cancer Neg Hx     Social History   Socioeconomic History  . Marital status: Married    Spouse name: Not on file  . Number of children: 1  . Years of education: Not on file  .  Highest education level: Not on file  Occupational History    Employer: [employer]    Comment: heart care    Comment: [employer]  Tobacco Use  . Smoking status: Former Smoker    Packs/day: 0.50    Years: 15.00    Pack years: 7.50    Types: Cigarettes    Quit date: 2010    Years since quitting: 12.4  . Smokeless tobacco: Never Used  . Tobacco comment: smoked since age 55 ; quit in 2010   Vaping Use  . Vaping Use: Never used  Substance and Sexual Activity  . Alcohol use: No  . Drug use: No  . Sexual activity: Yes    Partners: Male    Birth control/protection: Surgical  Other Topics Concern  . Not on file  Social History Narrative  . Not on file   Social Determinants of Health   Financial Resource Strain: Not on file  Food Insecurity: Not on file  Transportation Needs: Not on file  Physical Activity: Not on file  Stress: Not on file  Social Connections: Not on file  Intimate Partner Violence: Not on file    Outpatient Medications Prior to Visit  Medication Sig Dispense Refill  . acetaminophen (TYLENOL) 500 MG tablet Take 1,000 mg by mouth every 6 (six) hours as needed for mild pain or moderate pain.    Marland Kitchen albuterol (VENTOLIN HFA) 108 (90 Base) MCG/ACT inhaler Inhale 2 puffs into the lungs every 6 (six) hours as needed for wheezing or shortness of breath. 8 g 0  . azelastine (ASTELIN) 0.1 % nasal spray Place 2 sprays into both nostrils 2 (two) times daily as needed for rhinitis. 30 mL 2  . butalbital-acetaminophen-caffeine (FIORICET) 50-325-40 MG tablet Take 1-2 tablets by mouth every 4 (four) hours as needed for headache. Max 6 caps/day 20 tablet 0  . EPINEPHrine 0.3 mg/0.3 mL IJ SOAJ injection Inject 0.3 mg into the muscle as needed for anaphylaxis. 2 each 1  . metoprolol succinate (TOPROL-XL) 25 MG 24 hr tablet Take 1 tablet (25 mg total) by mouth daily. 90 tablet 3  . Multiple Vitamins-Minerals (BARIATRIC MULTIVITAMINS/IRON PO) Take 2 tablets by mouth 2 (two) times  daily. Chewable tablets     No facility-administered medications prior to visit.    Allergies  Allergen Reactions  . Bee Venom Anaphylaxis  . Contrave [Naltrexone-Bupropion Hcl Er] Nausea And Vomiting    Pt states it causes bloody stool, bloody vomit, and heart palpitations  . Erythromycin Nausea And Vomiting and Rash    Wheezing   . Shellfish Allergy Anaphylaxis  . Zithromax [Azithromycin] Nausea And Vomiting, Rash and Other (See Comments)  . Belviq [Lorcaserin Hcl] Other (See Comments)    Causes changes in vision and respiratory issues  . Qsymia [Phentermine-Topiramate] Other (See Comments)    Causes change in vision and respiratory issues   . Saxenda [Liraglutide -Weight Management] Other (See Comments)    Causes fainting  . Allegra-D [Fexofenadine-Pseudoephed Er] Nausea And Vomiting  . Levocetirizine Other (See Comments)    headache  . Methylprednisolone Other (See Comments)    Low heart rate  . Naltrexone Other (See Comments)    Bloody stools.   . Fish Allergy Rash  . Sulfa Antibiotics Rash    ROS Review of Systems  Constitutional: Negative.   HENT: Negative.   Eyes: Negative.   Respiratory: Negative.   Cardiovascular: Negative.   Gastrointestinal: Negative.   Endocrine: Negative.   Genitourinary: Negative.   Musculoskeletal: Negative.   Skin: Negative.   Neurological: Negative.   Hematological: Negative.   Psychiatric/Behavioral: Negative.       Objective:    Physical Exam Constitutional:      Appearance: Normal appearance. She is obese.  HENT:     Head: Normocephalic and atraumatic.     Right Ear: Tympanic membrane, ear canal and external ear normal.     Left Ear: Tympanic membrane, ear canal and external ear normal.     Nose: Nose normal.     Mouth/Throat:     Mouth: Mucous membranes are moist.     Pharynx: Oropharynx is clear.     Comments: Upper dentures in place Eyes:     Extraocular Movements: Extraocular movements intact.      Conjunctiva/sclera: Conjunctivae normal.     Pupils: Pupils are equal, round, and reactive to light.  Cardiovascular:     Rate and Rhythm: Normal rate and regular rhythm.     Pulses: Normal pulses.     Heart sounds: Normal heart sounds.  Pulmonary:     Effort: Pulmonary effort is normal.  Breath sounds: Normal breath sounds.  Abdominal:     General: Abdomen is flat. Bowel sounds are normal.     Palpations: Abdomen is soft.  Musculoskeletal:        General: Normal range of motion.     Cervical back: Normal range of motion and neck supple.  Skin:    General: Skin is warm and dry.     Capillary Refill: Capillary refill takes less than 2 seconds.  Neurological:     General: No focal deficit present.     Mental Status: She is alert and oriented to person, place, and time.     Cranial Nerves: No cranial nerve deficit.     Sensory: No sensory deficit.     Motor: No weakness.     Coordination: Coordination normal.     Gait: Gait normal.  Psychiatric:        Mood and Affect: Mood normal.        Behavior: Behavior normal.        Thought Content: Thought content normal.        Judgment: Judgment normal.     BP 130/84   Pulse 77   Temp 98.9 F (37.2 C)   Resp 20   Ht '5\' 4"'  (1.626 m)   Wt 241 lb (109.3 kg)   SpO2 96%   BMI 41.37 kg/m  Wt Readings from Last 3 Encounters:  08/07/20 241 lb (109.3 kg)  07/03/20 246 lb (111.6 kg)  03/20/20 234 lb (106.1 kg)     There are no preventive care reminders to display for this patient.  There are no preventive care reminders to display for this patient.  Lab Results  Component Value Date   TSH 1.934 08/02/2020   Lab Results  Component Value Date   WBC 5.4 08/02/2020   HGB 12.7 08/02/2020   HCT 39.4 08/02/2020   MCV 91.2 08/02/2020   PLT 229 08/02/2020   Lab Results  Component Value Date   NA 137 08/02/2020   K 4.3 08/02/2020   CO2 29 08/02/2020   GLUCOSE 97 08/02/2020   BUN 16 08/02/2020   CREATININE 0.72 08/02/2020    BILITOT 0.4 08/02/2020   ALKPHOS 75 08/02/2020   AST 23 08/02/2020   ALT 17 08/02/2020   PROT 7.4 08/02/2020   ALBUMIN 4.1 08/02/2020   CALCIUM 9.2 08/02/2020   ANIONGAP 7 08/02/2020   Lab Results  Component Value Date   CHOL 170 08/02/2020   Lab Results  Component Value Date   HDL 73 08/02/2020   Lab Results  Component Value Date   LDLCALC 89 08/02/2020   Lab Results  Component Value Date   TRIG 40 08/02/2020   Lab Results  Component Value Date   CHOLHDL 2.3 08/02/2020   Lab Results  Component Value Date   HGBA1C 5.8 (H) 08/02/2020      Assessment & Plan:   Problem List Items Addressed This Visit      Other   Morbid obesity ([place]) - Primary   Relevant Orders   CBC with Differential/Platelet   Lipid Panel With LDL/HDL Ratio   CMP14+EGFR   Encounter to establish care    -physical exam unremarkable      Abnormal mammogram    -asymmetry on screening mammogram -set her up for U/S and diagnostic mammogram of her left breast         No orders of the defined types were placed in this encounter.   Follow-up: Return in about  6 months (around 02/06/2021) for Lab follow-up.    Noreene Larsson, NP

## 2020-08-07 NOTE — Assessment & Plan Note (Signed)
-  asymmetry on screening mammogram -set her up for U/S and diagnostic mammogram of her left breast

## 2020-08-07 NOTE — Patient Instructions (Signed)
Please have fasting labs drawn 2-3 days prior to your appointment so we can discuss the results during your office visit.  

## 2020-08-18 ENCOUNTER — Telehealth: Payer: 59 | Admitting: Orthopedic Surgery

## 2020-08-18 DIAGNOSIS — R0989 Other specified symptoms and signs involving the circulatory and respiratory systems: Secondary | ICD-10-CM | POA: Diagnosis not present

## 2020-08-18 MED ORDER — AMOXICILLIN-POT CLAVULANATE 875-125 MG PO TABS: 1.0000 | ORAL_TABLET | Freq: Two times a day (BID) | ORAL | 0 refills | Status: AC

## 2020-08-18 NOTE — Progress Notes (Signed)

## 2020-08-28 ENCOUNTER — Other Ambulatory Visit (HOSPITAL_COMMUNITY): Payer: 59

## 2020-08-28 ENCOUNTER — Encounter (HOSPITAL_COMMUNITY): Payer: 59

## 2020-09-11 ENCOUNTER — Other Ambulatory Visit: Payer: Self-pay

## 2020-09-11 ENCOUNTER — Ambulatory Visit (HOSPITAL_COMMUNITY)
Admission: RE | Admit: 2020-09-11 | Discharge: 2020-09-11 | Disposition: A | Discharge status: Home / Self Care | Payer: 59 | Source: Ambulatory Visit | Attending: Nurse Practitioner | Admitting: Nurse Practitioner

## 2020-09-11 DIAGNOSIS — R928 Other abnormal and inconclusive findings on diagnostic imaging of breast: Secondary | ICD-10-CM | POA: Diagnosis not present

## 2020-09-11 DIAGNOSIS — R922 Inconclusive mammogram: Secondary | ICD-10-CM | POA: Diagnosis not present

## 2020-09-12 NOTE — Progress Notes (Signed)
No evidence of malignancy. Repeat mammogram in 11 months.

## 2020-11-06 ENCOUNTER — Other Ambulatory Visit (HOSPITAL_COMMUNITY): Payer: Self-pay

## 2020-11-13 ENCOUNTER — Telehealth: Payer: 59 | Admitting: Physician Assistant

## 2020-11-13 DIAGNOSIS — J019 Acute sinusitis, unspecified: Secondary | ICD-10-CM | POA: Diagnosis not present

## 2020-11-13 DIAGNOSIS — B9689 Other specified bacterial agents as the cause of diseases classified elsewhere: Secondary | ICD-10-CM | POA: Diagnosis not present

## 2020-11-13 MED ORDER — AMOXICILLIN-POT CLAVULANATE 875-125 MG PO TABS: 1.0000 | ORAL_TABLET | Freq: Two times a day (BID) | ORAL | 0 refills | Status: DC

## 2020-11-13 NOTE — Progress Notes (Signed)

## 2020-11-13 NOTE — Progress Notes (Signed)
I have spent 5 minutes in review of e-visit questionnaire, review and updating patient chart, medical decision making and response to patient.   Danniel Grenz Cody Kaylen Nghiem, PA-C    

## 2020-11-22 ENCOUNTER — Telehealth: Payer: Self-pay

## 2020-11-22 MED ORDER — FUROSEMIDE 20 MG PO TABS: ORAL_TABLET | ORAL | 3 refills | Status: AC

## 2020-11-22 NOTE — Telephone Encounter (Signed)
Per B.Strader, PA-C, Lasix 20 mg daily as needed #90 RF: 3 to [employer]

## 2020-11-29 ENCOUNTER — Encounter: Payer: Self-pay | Admitting: Nurse Practitioner

## 2020-12-13 ENCOUNTER — Encounter: Payer: Self-pay | Admitting: Nurse Practitioner

## 2020-12-13 ENCOUNTER — Ambulatory Visit: Payer: 59 | Admitting: Nurse Practitioner

## 2020-12-13 ENCOUNTER — Other Ambulatory Visit: Payer: Self-pay

## 2020-12-13 ENCOUNTER — Other Ambulatory Visit (HOSPITAL_COMMUNITY): Payer: Self-pay

## 2020-12-13 DIAGNOSIS — H8110 Benign paroxysmal vertigo, unspecified ear: Secondary | ICD-10-CM | POA: Diagnosis not present

## 2020-12-13 MED ORDER — MECLIZINE HCL 25 MG PO TABS
25.0000 mg | ORAL_TABLET | Freq: Three times a day (TID) | ORAL | 0 refills | Status: DC | PRN
Start: 2020-12-13 — End: 2023-12-10
  Filled 2020-12-13: qty 30, 10d supply, fill #0

## 2020-12-13 NOTE — Assessment & Plan Note (Signed)
-  Rx. Meclizine -if no improvement in a week, will consider PT vs neuro vs ENT

## 2020-12-13 NOTE — Progress Notes (Signed)
Acute Office Visit  Subjective:    Patient ID: Monica Hernandez, female    DOB: 1975/06/07, 45 y.o.   MRN: [id number]  Chief Complaint  Patient presents with   Follow-up    Dizzy spells since Monday     HPI Patient is in today for dizziness that started on 12/10/20. She is having some left ear pain, and she states that he has been having severe hot flashes. The dizziness occurs after hot flashes, and this is worse at night.  She states that if she bends over after a to flash, she is "almost on the floor".  Past Medical History:  Diagnosis Date   Allergy    Anemia    Asthma    Eczema    GERD (gastroesophageal reflux disease)    History of kidney stones    Hypertension    Kidney stone    Mitral valve prolapse    was told as a kid that she had this but during ECHO for eval, ECHO did not indicate this , see 08-25-16 epic result    PONV (postoperative nausea and vomiting)    epidural with c section caused N/V    Pre-diabetes    Recurrent upper respiratory infection (URI)    Ulcer     Past Surgical History:  Procedure Laterality Date   ABDOMINAL HYSTERECTOMY     total   ADENOIDECTOMY     ADENOIDECTOMY, TONSILLECTOMY AND MYRINGOTOMY WITH TUBE PLACEMENT     CESAREAN SECTION     FRACTURE SURGERY     GASTRIC ROUX-EN-Y N/A 03/31/2017   Procedure: LAPAROSCOPIC ROUX-EN-Y GASTRIC BYPASS WITH UPPER ENDOSCOPY;  Surgeon: Kinsinger, De Blanch, MD;  Location: [facility];  Service: General;  Laterality: N/A;   HERNIA REPAIR     umbilical   LEG SURGERY Left    TONSILLECTOMY     TUBAL LIGATION      Family History  Problem Relation Age of Onset   Hypertension Mother    Stroke Mother    Thyroid cancer Mother    Stroke Father    Lung cancer Father    Brain cancer Father    Bone cancer Father    Hypertension Sister    Heart disease Maternal Grandmother    Diabetes Maternal Grandmother    Asthma Other    Cancer Other    Stomach cancer Neg Hx    Colon cancer Neg Hx     Social  History   Socioeconomic History   Marital status: Married    Spouse name: Not on file   Number of children: 1   Years of education: Not on file   Highest education level: Not on file  Occupational History    Employer: [employer]    Comment: heart care    Comment: [employer]  Tobacco Use   Smoking status: Former    Packs/day: 0.50    Years: 15.00    Pack years: 7.50    Types: Cigarettes    Quit date: 2010    Years since quitting: 12.7   Smokeless tobacco: Never   Tobacco comments:    smoked since age 39 ; quit in 2010   Vaping Use   Vaping Use: Never used  Substance and Sexual Activity   Alcohol use: No   Drug use: No   Sexual activity: Yes    Partners: Male    Birth control/protection: Surgical  Other Topics Concern   Not on file  Social History Narrative   Not  on file   Social Determinants of Health   Financial Resource Strain: Not on file  Food Insecurity: Not on file  Transportation Needs: Not on file  Physical Activity: Not on file  Stress: Not on file  Social Connections: Not on file  Intimate Partner Violence: Not on file    Outpatient Medications Prior to Visit  Medication Sig Dispense Refill   acetaminophen (TYLENOL) 500 MG tablet Take 1,000 mg by mouth every 6 (six) hours as needed for mild pain or moderate pain.     albuterol (VENTOLIN HFA) 108 (90 Base) MCG/ACT inhaler Inhale 2 puffs into the lungs every 6 (six) hours as needed for wheezing or shortness of breath. 8 g 0   amoxicillin-clavulanate (AUGMENTIN) 875-125 MG tablet Take 1 tablet by mouth 2 (two) times daily. 14 tablet 0   azelastine (ASTELIN) 0.1 % nasal spray Place 2 sprays into both nostrils 2 (two) times daily as needed for rhinitis. 30 mL 2   butalbital-acetaminophen-caffeine (FIORICET) 50-325-40 MG tablet Take 1-2 tablets by mouth every 4 (four) hours as needed for headache. Max 6 caps/day 20 tablet 0   EPINEPHrine 0.3 mg/0.3 mL IJ SOAJ injection Inject 0.3 mg into the muscle as needed  for anaphylaxis. 2 each 1   furosemide (LASIX) 20 MG tablet Take 20 mg daily as needed for swelling, weight gain 90 tablet 3   metoprolol succinate (TOPROL-XL) 25 MG 24 hr tablet Take 1 tablet (25 mg total) by mouth daily. 90 tablet 3   Multiple Vitamins-Minerals (BARIATRIC MULTIVITAMINS/IRON PO) Take 2 tablets by mouth 2 (two) times daily. Chewable tablets     No facility-administered medications prior to visit.    Allergies  Allergen Reactions   Bee Venom Anaphylaxis   Contrave [Naltrexone-Bupropion Hcl Er] Nausea And Vomiting    Pt states it causes bloody stool, bloody vomit, and heart palpitations   Erythromycin Nausea And Vomiting and Rash    Wheezing    Shellfish Allergy Anaphylaxis   Zithromax [Azithromycin] Nausea And Vomiting, Rash and Other (See Comments)   Belviq [Lorcaserin Hcl] Other (See Comments)    Causes changes in vision and respiratory issues   Qsymia [Phentermine-Topiramate] Other (See Comments)    Causes change in vision and respiratory issues    Saxenda [Liraglutide -Weight Management] Other (See Comments)    Causes fainting   Allegra-D [Fexofenadine-Pseudoephed Er] Nausea And Vomiting   Levocetirizine Other (See Comments)    headache   Methylprednisolone Other (See Comments)    Low heart rate   Naltrexone Other (See Comments)    Bloody stools.    Fish Allergy Rash   Sulfa Antibiotics Rash    Review of Systems  Constitutional: Negative.   Respiratory: Negative.    Cardiovascular: Negative.   Endocrine:       Frequent hot flashes; stopped estrogen with dr's advise d/t elevated BP while taking estrogen  Neurological:  Positive for dizziness.      Objective:    Physical Exam Constitutional:      Appearance: Normal appearance. She is obese.  Cardiovascular:     Rate and Rhythm: Normal rate and regular rhythm.     Pulses: Normal pulses.     Heart sounds: Normal heart sounds.  Pulmonary:     Effort: Pulmonary effort is normal.     Breath sounds:  Normal breath sounds.  Neurological:     Mental Status: She is alert.     Comments: Positive Dix-Hallpike maneuver  Psychiatric:  Mood and Affect: Mood normal.        Behavior: Behavior normal.        Thought Content: Thought content normal.        Judgment: Judgment normal.    BP 120/72 (BP Location: Right Arm, Cuff Size: Large)   Pulse 94   Temp 98.8 F (37.1 C) (Oral)   Ht 5\' 4"  (1.626 m)   Wt 242 lb 1.3 oz (109.8 kg)   SpO2 98%   BMI 41.55 kg/m  Wt Readings from Last 3 Encounters:  12/13/20 242 lb 1.3 oz (109.8 kg)  08/07/20 241 lb (109.3 kg)  07/03/20 246 lb (111.6 kg)    There are no preventive care reminders to display for this patient.  There are no preventive care reminders to display for this patient.   Lab Results  Component Value Date   TSH 1.934 08/02/2020   Lab Results  Component Value Date   WBC 5.4 08/02/2020   HGB 12.7 08/02/2020   HCT 39.4 08/02/2020   MCV 91.2 08/02/2020   PLT 229 08/02/2020   Lab Results  Component Value Date   NA 137 08/02/2020   K 4.3 08/02/2020   CO2 29 08/02/2020   GLUCOSE 97 08/02/2020   BUN 16 08/02/2020   CREATININE 0.72 08/02/2020   BILITOT 0.4 08/02/2020   ALKPHOS 75 08/02/2020   AST 23 08/02/2020   ALT 17 08/02/2020   PROT 7.4 08/02/2020   ALBUMIN 4.1 08/02/2020   CALCIUM 9.2 08/02/2020   ANIONGAP 7 08/02/2020   Lab Results  Component Value Date   CHOL 170 08/02/2020   Lab Results  Component Value Date   HDL 73 08/02/2020   Lab Results  Component Value Date   LDLCALC 89 08/02/2020   Lab Results  Component Value Date   TRIG 40 08/02/2020   Lab Results  Component Value Date   CHOLHDL 2.3 08/02/2020   Lab Results  Component Value Date   HGBA1C 5.8 (H) 08/02/2020       Assessment & Plan:   Problem List Items Addressed This Visit       Nervous and Auditory   BPPV (benign paroxysmal positional vertigo)    -Rx. Meclizine -if no improvement in a week, will consider PT vs neuro vs  ENT      Relevant Medications   meclizine (ANTIVERT) 25 MG tablet     Meds ordered this encounter  Medications   meclizine (ANTIVERT) 25 MG tablet    Sig: Take 1 tablet (25 mg total) by mouth 3 (three) times daily as needed for dizziness.    Dispense:  30 tablet    Refill:  0     08/04/2020, NP

## 2020-12-17 ENCOUNTER — Encounter: Payer: Self-pay | Admitting: Physician Assistant

## 2020-12-21 ENCOUNTER — Other Ambulatory Visit (HOSPITAL_COMMUNITY): Payer: Self-pay

## 2021-01-05 DIAGNOSIS — H5213 Myopia, bilateral: Secondary | ICD-10-CM | POA: Diagnosis not present

## 2021-01-09 ENCOUNTER — Encounter: Payer: Self-pay | Admitting: Physician Assistant

## 2021-01-09 ENCOUNTER — Other Ambulatory Visit (HOSPITAL_COMMUNITY): Payer: Self-pay

## 2021-01-09 ENCOUNTER — Ambulatory Visit: Payer: 59 | Admitting: Physician Assistant

## 2021-01-09 VITALS — BP 126/80 | HR 70 | Ht 64.0 in | Wt 245.4 lb

## 2021-01-09 DIAGNOSIS — Z1212 Encounter for screening for malignant neoplasm of rectum: Secondary | ICD-10-CM | POA: Diagnosis not present

## 2021-01-09 DIAGNOSIS — K219 Gastro-esophageal reflux disease without esophagitis: Secondary | ICD-10-CM | POA: Diagnosis not present

## 2021-01-09 DIAGNOSIS — Z1211 Encounter for screening for malignant neoplasm of colon: Secondary | ICD-10-CM | POA: Diagnosis not present

## 2021-01-09 DIAGNOSIS — Z9884 Bariatric surgery status: Secondary | ICD-10-CM | POA: Diagnosis not present

## 2021-01-09 DIAGNOSIS — R1013 Epigastric pain: Secondary | ICD-10-CM

## 2021-01-09 MED ORDER — FAMOTIDINE 40 MG PO TABS
40.0000 mg | ORAL_TABLET | Freq: Two times a day (BID) | ORAL | 3 refills | Status: DC
  Filled 2021-01-09: qty 60, 30d supply, fill #0

## 2021-01-09 NOTE — Patient Instructions (Signed)
You have been scheduled for an endoscopy. Please follow written instructions given to you at your visit today. If you use inhalers (even only as needed), please bring them with you on the day of your procedure.  If you are age 47 or younger, your body mass index should be between 19-25. Your Body mass index is 42.12 kg/m. If this is out of the aformentioned range listed, please consider follow up with your Primary Care Provider.   ________________________________________________________  The [facility] providers would like to encourage you to use [facility] to communicate with providers for non-urgent requests or questions.  Due to long hold times on the telephone, sending your provider a message by [facility] may be a faster and more efficient way to get a response.  Please allow 48 business hours for a response.  Please remember that this is for non-urgent requests.   Thank you for choosing me and [facility].  Hyacinth Meeker -PA

## 2021-01-09 NOTE — Progress Notes (Signed)
Chief Complaint: "Stomach pains" and GERD  HPI:    Monica Hernandez is a 45 year old Caucasian female with a past medical history of reflux and others listed below, known to Dr. Russella Dar, who was referred to me by Heather Roberts, NP for a complaint of "stomach pains" and GERD.    07/16/2016 patient seen in clinic by Dr. Russella Dar for reflux and abdominal pain for 6 months.  He describes some regurgitation and sharp shooting right upper quadrant and left upper quadrant pains.  Apparently had diarrhea with Pepcid, Protonix, Omeprazole and Mylanta.  H. pylori breath test 05/2016 was negative.  Right upper quadrant ultrasound 04/2016 showed fatty liver otherwise normal.  At that time given a trial of Prevacid 30 mg daily and use Tums as needed.  Discussed possible EGD if symptoms did not improve.  Also given a trial of Dicyclomine 10 mg 3 times daily before meals for her right upper quadrant and left upper quadrant pain.  Discussed that if symptoms persisted could order a CTAP.  Also discussed fatty liver.    Today, the patient presents to clinic and tells me that she had a gastric bypass and "hernia repair" in 2018 and this helped a lot of her issues with reflux and epigastric pain until the past 3 to 4 months.  Explains that over the past 3 to 4 months she has had worsening epigastric pain immediately after eating a bite of food which then starts causing nausea and oftentimes she will vomit.  Also experiencing reflux throughout the day.  Tells me it feels like there is a constant "bubble" in her epigastrium towards the left upper quadrant.  Tells me she can drink liquids fine.  She is not losing weight.  Her bowel movements have changed, they were typically every day and now she can go a few days without one.  She did restart Famotidine over-the-counter once daily for the past 2 months and feels like this helped maybe a little bit with the reflux but not with the pain at all.  She does work at [employer] as well and wonders  if she "pulled something" and damaged her bypass.  Describes that previously when she was taking PPIs these caused swelling in her legs and she had to also take a fluid pill with them.  She would prefer not to use these if she does not need to.    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    Denies fever, chills, blood in her stool or symptoms that awaken her from sleep.  Past Medical History:  Diagnosis Date   Allergy    Anemia    Asthma    Eczema    GERD (gastroesophageal reflux disease)    History of kidney stones    Hypertension    Kidney stone    Mitral valve prolapse    was told as a kid that she had this but during ECHO for eval, ECHO did not indicate this , see 08-25-16 epic result    PONV (postoperative nausea and vomiting)    epidural with c section caused N/V    Pre-diabetes    Recurrent upper respiratory infection (URI)    Ulcer     Past Surgical History:  Procedure Laterality Date   ABDOMINAL HYSTERECTOMY     total   ADENOIDECTOMY     ADENOIDECTOMY, TONSILLECTOMY AND MYRINGOTOMY WITH TUBE PLACEMENT     CESAREAN SECTION  FRACTURE SURGERY     GASTRIC ROUX-EN-Y N/A 03/31/2017   Procedure: LAPAROSCOPIC ROUX-EN-Y GASTRIC BYPASS WITH UPPER ENDOSCOPY;  Surgeon: Sheliah Hatch, De Blanch, MD;  Location: [facility];  Service: General;  Laterality: N/A;   HERNIA REPAIR     umbilical   LEG SURGERY Left    TONSILLECTOMY     TUBAL LIGATION      Current Outpatient Medications  Medication Sig Dispense Refill   acetaminophen (TYLENOL) 500 MG tablet Take 1,000 mg by mouth every 6 (six) hours as needed for mild pain or moderate pain.     albuterol (VENTOLIN HFA) 108 (90 Base) MCG/ACT inhaler Inhale 2 puffs into the lungs every 6 (six) hours as needed for wheezing or shortness of breath. 8 g 0   amoxicillin-clavulanate (AUGMENTIN) 875-125 MG tablet Take 1 tablet by mouth 2 (two) times daily. 14 tablet 0   azelastine (ASTELIN)  0.1 % nasal spray Place 2 sprays into both nostrils 2 (two) times daily as needed for rhinitis. 30 mL 2   butalbital-acetaminophen-caffeine (FIORICET) 50-325-40 MG tablet Take 1-2 tablets by mouth every 4 (four) hours as needed for headache. Max 6 caps/day 20 tablet 0   EPINEPHrine 0.3 mg/0.3 mL IJ SOAJ injection Inject 0.3 mg into the muscle as needed for anaphylaxis. 2 each 1   furosemide (LASIX) 20 MG tablet Take 20 mg daily as needed for swelling, weight gain 90 tablet 3   meclizine (ANTIVERT) 25 MG tablet Take 1 tablet (25 mg total) by mouth 3 (three) times daily as needed for dizziness. 30 tablet 0   metoprolol succinate (TOPROL-XL) 25 MG 24 hr tablet Take 1 tablet (25 mg total) by mouth daily. 90 tablet 3   Multiple Vitamins-Minerals (BARIATRIC MULTIVITAMINS/IRON PO) Take 2 tablets by mouth 2 (two) times daily. Chewable tablets     No current facility-administered medications for this visit.    Allergies as of 01/09/2021 - Review Complete 01/09/2021  Allergen Reaction Noted   Bee venom Anaphylaxis 07/03/2020   Contrave [naltrexone-bupropion hcl er] Nausea And Vomiting 04/30/2016   Erythromycin Nausea And Vomiting and Rash 12/17/2013   Shellfish allergy Anaphylaxis 01/06/2017   Zithromax [azithromycin] Nausea And Vomiting, Rash, and Other (See Comments) 12/17/2013   Belviq [lorcaserin hcl] Other (See Comments) 01/06/2017   Qsymia [phentermine-topiramate] Other (See Comments) 01/06/2017   Bernie Covey [liraglutide -weight management] Other (See Comments) 01/06/2017   Allegra-d [fexofenadine-pseudoephed er] Nausea And Vomiting 04/30/2016   Levocetirizine Other (See Comments) 04/01/2018   Methylprednisolone Other (See Comments) 03/17/2020   Naltrexone Other (See Comments) 08/27/2016   Fish allergy Rash 01/06/2017   Sulfa antibiotics Rash 12/17/2013    Family History  Problem Relation Age of Onset   Hypertension Mother    Stroke Mother    Thyroid cancer Mother    Stroke Father    Lung  cancer Father    Brain cancer Father    Bone cancer Father    Hypertension Sister    Heart disease Maternal Grandmother    Diabetes Maternal Grandmother    Asthma Other    Cancer Other    Stomach cancer Neg Hx    Colon cancer Neg Hx     Social History   Socioeconomic History   Marital status: Married    Spouse name: Not on file   Number of children: 1   Years of education: Not on file   Highest education level: Not on file  Occupational History    Employer: [employer]    Comment: heart care  Comment: [employer]  Tobacco Use   Smoking status: Former    Packs/day: 0.50    Years: 15.00    Pack years: 7.50    Types: Cigarettes    Quit date: 2010    Years since quitting: 12.8   Smokeless tobacco: Never   Tobacco comments:    smoked since age 34 ; quit in 2010   Vaping Use   Vaping Use: Never used  Substance and Sexual Activity   Alcohol use: No   Drug use: No   Sexual activity: Yes    Partners: Male    Birth control/protection: Surgical  Other Topics Concern   Not on file  Social History Narrative   Not on file   Social Determinants of Health   Financial Resource Strain: Not on file  Food Insecurity: Not on file  Transportation Needs: Not on file  Physical Activity: Not on file  Stress: Not on file  Social Connections: Not on file  Intimate Partner Violence: Not on file    Review of Systems:    Constitutional: No weight loss, fever or chills Skin: No rash  Cardiovascular: No chest pain Respiratory: No SOB  Gastrointestinal: See HPI and otherwise negative Genitourinary: No dysuria Neurological: No headache, dizziness or syncope Musculoskeletal: No new muscle or joint pain Hematologic: No bleeding Psychiatric: No history of depression or anxiety   Physical Exam:  Vital signs: BP 126/80   Pulse 70   Ht 5\' 4"  (1.626 m)   Wt 245 lb 6 oz (111.3 kg)   BMI 42.12 kg/m    Constitutional:   Pleasant overweight Caucasian female appears to be in NAD,  Well developed, Well nourished, alert and cooperative Head:  Normocephalic and atraumatic. Eyes:   PEERL, EOMI. No icterus. Conjunctiva pink. Ears:  Normal auditory acuity. Neck:  Supple Throat: Oral cavity and pharynx without inflammation, swelling or lesion.  Respiratory: Respirations even and unlabored. Lungs clear to auscultation bilaterally.   No wheezes, crackles, or rhonchi.  Cardiovascular: Normal S1, S2. No MRG. Regular rate and rhythm. No peripheral edema, cyanosis or pallor.  Gastrointestinal:  Soft, nondistended, moderate epigastric ttp, No rebound or guarding. Normal bowel sounds. No appreciable masses or hepatomegaly. Rectal:  Not performed.  Msk:  Symmetrical without gross deformities. Without edema, no deformity or joint abnormality.  Neurologic:  Alert and  oriented x4;  grossly normal neurologically.  Skin:   Dry and intact without significant lesions or rashes. Psychiatric:  Demonstrates good judgement and reason without abnormal affect or behaviors.  RELEVANT LABS AND IMAGING: CBC    Component Value Date/Time   WBC 5.4 08/02/2020 0826   RBC 4.32 08/02/2020 0826   HGB 12.7 08/02/2020 0826   HGB 12.7 04/01/2018 1131   HCT 39.4 08/02/2020 0826   HCT 38.2 04/01/2018 1131   PLT 229 08/02/2020 0826   PLT 264 04/01/2018 1131   MCV 91.2 08/02/2020 0826   MCV 88 04/01/2018 1131   MCH 29.4 08/02/2020 0826   MCHC 32.2 08/02/2020 0826   RDW 13.5 08/02/2020 0826   RDW 12.8 04/01/2018 1131   LYMPHSABS 2.1 08/02/2020 0826   LYMPHSABS 2.7 04/10/2017 1229   MONOABS 0.3 08/02/2020 0826   EOSABS 0.2 08/02/2020 0826   EOSABS 0.4 04/10/2017 1229   BASOSABS 0.0 08/02/2020 0826   BASOSABS 0.0 04/10/2017 1229    CMP     Component Value Date/Time   NA 137 08/02/2020 0826   NA 143 07/06/2017 1421   K 4.3 08/02/2020 0826  CL 101 08/02/2020 0826   CO2 29 08/02/2020 0826   GLUCOSE 97 08/02/2020 0826   BUN 16 08/02/2020 0826   BUN 10 07/06/2017 1421   CREATININE 0.72  08/02/2020 0826   CALCIUM 9.2 08/02/2020 0826   PROT 7.4 08/02/2020 0826   PROT 7.1 07/06/2017 1421   ALBUMIN 4.1 08/02/2020 0826   ALBUMIN 4.2 07/06/2017 1421   AST 23 08/02/2020 0826   ALT 17 08/02/2020 0826   ALKPHOS 75 08/02/2020 0826   BILITOT 0.4 08/02/2020 0826   BILITOT 0.3 07/06/2017 1421   GFRNONAA >60 08/02/2020 0826   GFRAA >60 10/28/2017 2227    Assessment: 1.  GERD: Increase in symptoms over the past 3 to 4 months with some episodes of nausea and vomiting; consider gastric ulcer/anastomotic ulcer versus gastritis versus other 2.  Epigastric pain: With above 3.  History of gastric bypass: In 2018 4.  Screening for colorectal cancer: Patient is over 45 and has never had screening for colorectal cancer, do not feel she would be able to handle bowel prep at this time given nausea and vomiting symptoms  Plan: 1.  Scheduled patient for diagnostic EGD in the [facility] with Dr. Russella Dar.  She was offered sooner dates but wanted to wait until January given that she is about to change jobs and has to request time off.  Did provide the patient a detailed list of risks for the procedure and she agrees to proceed. 2.  For now patient will increase her Famotidine to 40 mg twice daily, every morning and nightly.  Prescribed #60 with 3 refills. 3.  Reviewed antireflux diet and lifestyle modifications. 4.  Discussed that the patient will need a screening colonoscopy at some point but at this time with her nausea/vomiting symptoms do not feel that she would be able to drink the bowel prep.  We will wait. 5.  Patient to follow in clinic per recommendations from Dr. Russella Dar after time of procedure.  Hyacinth Meeker, PA-C [facility] 01/09/2021, 8:34 AM  Cc: Heather Roberts, NP

## 2021-01-10 NOTE — Progress Notes (Signed)
Reviewed and agree with management plan.  Silva Aamodt T. Terissa Haffey, MD FACG 

## 2021-01-12 ENCOUNTER — Telehealth: Payer: 59 | Admitting: Nurse Practitioner

## 2021-01-12 DIAGNOSIS — J0100 Acute maxillary sinusitis, unspecified: Secondary | ICD-10-CM | POA: Diagnosis not present

## 2021-01-12 MED ORDER — AMOXICILLIN-POT CLAVULANATE 875-125 MG PO TABS: 1.0000 | ORAL_TABLET | Freq: Two times a day (BID) | ORAL | 0 refills | Status: DC

## 2021-01-12 NOTE — Progress Notes (Signed)

## 2021-01-17 ENCOUNTER — Other Ambulatory Visit (HOSPITAL_COMMUNITY): Payer: Self-pay

## 2021-02-06 ENCOUNTER — Ambulatory Visit: Payer: 59 | Admitting: Nurse Practitioner

## 2021-02-14 ENCOUNTER — Other Ambulatory Visit (HOSPITAL_COMMUNITY): Payer: Self-pay

## 2021-02-14 DIAGNOSIS — E569 Vitamin deficiency, unspecified: Secondary | ICD-10-CM | POA: Diagnosis not present

## 2021-02-14 DIAGNOSIS — K289 Gastrojejunal ulcer, unspecified as acute or chronic, without hemorrhage or perforation: Secondary | ICD-10-CM | POA: Diagnosis not present

## 2021-02-14 DIAGNOSIS — Z9884 Bariatric surgery status: Secondary | ICD-10-CM | POA: Diagnosis not present

## 2021-02-14 MED ORDER — ESOMEPRAZOLE MAGNESIUM 40 MG PO CPDR
40.0000 mg | DELAYED_RELEASE_CAPSULE | Freq: Every day | ORAL | 11 refills | Status: DC
  Filled 2021-02-14: qty 30, 30d supply, fill #0

## 2021-02-19 ENCOUNTER — Telehealth: Payer: 59 | Admitting: Physician Assistant

## 2021-02-19 ENCOUNTER — Other Ambulatory Visit (HOSPITAL_COMMUNITY): Payer: Self-pay

## 2021-02-19 DIAGNOSIS — H1033 Unspecified acute conjunctivitis, bilateral: Secondary | ICD-10-CM

## 2021-02-19 MED ORDER — POLYMYXIN B-TRIMETHOPRIM 10000-0.1 UNIT/ML-% OP SOLN
1.0000 [drp] | Freq: Four times a day (QID) | OPHTHALMIC | 0 refills | Status: DC
  Filled 2021-02-19: qty 10, 5d supply, fill #0

## 2021-02-19 NOTE — Progress Notes (Signed)

## 2021-02-19 NOTE — Progress Notes (Signed)
I have spent 5 minutes in review of e-visit questionnaire, review and updating patient chart, medical decision making and response to patient.   Justiss Gerbino Cody Alexxia Stankiewicz, PA-C    

## 2021-03-04 ENCOUNTER — Telehealth: Payer: 59 | Admitting: Physician Assistant

## 2021-03-04 DIAGNOSIS — B9789 Other viral agents as the cause of diseases classified elsewhere: Secondary | ICD-10-CM

## 2021-03-04 DIAGNOSIS — J019 Acute sinusitis, unspecified: Secondary | ICD-10-CM

## 2021-03-04 MED ORDER — BENZONATATE 100 MG PO CAPS: 100.0000 mg | ORAL_CAPSULE | Freq: Three times a day (TID) | ORAL | 0 refills | Status: DC | PRN

## 2021-03-04 MED ORDER — IPRATROPIUM BROMIDE 0.03 % NA SOLN: 2.0000 | Freq: Two times a day (BID) | NASAL | 0 refills | Status: DC

## 2021-03-04 MED ORDER — ALBUTEROL SULFATE HFA 108 (90 BASE) MCG/ACT IN AERS: 2.0000 | INHALATION_SPRAY | Freq: Four times a day (QID) | RESPIRATORY_TRACT | 0 refills | Status: DC | PRN

## 2021-03-04 NOTE — Progress Notes (Signed)
E-Visit for Sinus Problems  We are sorry that you are not feeling well.  Here is how we plan to help!  Based on what you have shared with me it looks like you have sinusitis.  Sinusitis is inflammation and infection in the sinus cavities of the head.  Based on your presentation I believe you most likely have Acute Viral Sinusitis.This is an infection most likely caused by a virus. There is not specific treatment for viral sinusitis other than to help you with the symptoms until the infection runs its course.  You may use an oral decongestant such as Mucinex D or if you have glaucoma or high blood pressure use plain Mucinex. Saline nasal spray help and can safely be used as often as needed for congestion, I have prescribed: Ipratropium Bromide nasal spray 0.03% 2 sprays in eah nostril 2-3 times a day, albuterol inhaler 1-2 puffs every 4-6 hours as needed for wheezing and shortness of breath, Tessalon perles for cough  Some authorities believe that zinc sprays or the use of Echinacea may shorten the course of your symptoms.  Sinus infections are not as easily transmitted as other respiratory infection, however we still recommend that you avoid close contact with loved ones, especially the very young and elderly.  Remember to wash your hands thoroughly throughout the day as this is the number one way to prevent the spread of infection!  Home Care: Only take medications as instructed by your medical team. Do not take these medications with alcohol. A steam or ultrasonic humidifier can help congestion.  You can place a towel over your head and breathe in the steam from hot water coming from a faucet. Avoid close contacts especially the very young and the elderly. Cover your mouth when you cough or sneeze. Always remember to wash your hands.  Get Help Right Away If: You develop worsening fever or sinus pain. You develop a severe head ache or visual changes. Your symptoms persist after you have  completed your treatment plan.  Make sure you Understand these instructions. Will watch your condition. Will get help right away if you are not doing well or get worse.   Thank you for choosing an e-visit.  Your e-visit answers were reviewed by a board certified advanced clinical practitioner to complete your personal care plan. Depending upon the condition, your plan could have included both over the counter or prescription medications.  Please review your pharmacy choice. Make sure the pharmacy is open so you can pick up prescription now. If there is a problem, you may contact your provider through [employer] and have the prescription routed to another pharmacy.  Your safety is important to [place]. If you have drug allergies check your prescription carefully.   For the next 24 hours you can use MyChart to ask questions about today's visit, request a non-urgent call back, or ask for a work or school excuse. You will get an email in the next two days asking about your experience. I hope that your e-visit has been valuable and will speed your recovery.  I provided 5 minutes of non face-to-face time during this encounter for chart review and documentation.

## 2021-03-10 HISTORY — PX: MULTIPLE TOOTH EXTRACTIONS: SHX2053

## 2021-03-11 ENCOUNTER — Telehealth: Payer: Self-pay

## 2021-03-11 NOTE — Telephone Encounter (Signed)
Pt request to stay at [facility] - sent to [facility] 03/11/21

## 2021-03-12 ENCOUNTER — Telehealth: Payer: 59 | Admitting: Nurse Practitioner

## 2021-03-12 DIAGNOSIS — J069 Acute upper respiratory infection, unspecified: Secondary | ICD-10-CM

## 2021-03-12 NOTE — Progress Notes (Signed)
Based on what you shared with me, I feel your condition warrants further evaluation and I recommend that you be seen in a face to face visit.  Because of your recent ongoing illness and history of pneumonia we feel it would be best for you to be assessed in person.    NOTE: There will be NO CHARGE for this eVisit   If you are having a true medical emergency please call 911.      For an urgent face to face visit, [facility] has six urgent care centers for your convenience:     [facility] [place] [facility] [place] Get Driving Directions [id number] [place], [place] [address]    [facility]) Get Driving Directions [id number] [place], [place] [address]  [facility] [place] [facility]) Get Driving Directions [id number] [address] [place],  [place]  [address]  [facility]  Get Driving Directions [id number] [place], [place], [place] [address]   [facility] [place] Get Driving Directions  [id number] [address] [place], [place] [address]   [facility] [place] Get Driving Directions [id number] [address] [place], [place] [address]  Your MyChart E-visit questionnaire answers were reviewed by a board certified advanced clinical practitioner to complete your personal care plan based on your specific symptoms.  Thank you for using e-Visits.

## 2021-03-13 ENCOUNTER — Other Ambulatory Visit (HOSPITAL_COMMUNITY): Payer: Self-pay

## 2021-03-13 ENCOUNTER — Encounter: Payer: 59 | Admitting: Gastroenterology

## 2021-03-13 MED ORDER — AMOXICILLIN 875 MG PO TABS
875.0000 mg | ORAL_TABLET | Freq: Two times a day (BID) | ORAL | 0 refills | Status: DC
  Filled 2021-03-13: qty 14, 7d supply, fill #0

## 2021-03-21 ENCOUNTER — Other Ambulatory Visit (HOSPITAL_COMMUNITY): Payer: Self-pay

## 2021-03-21 MED ORDER — AMOXICILLIN 500 MG PO CAPS
2000.0000 mg | ORAL_CAPSULE | ORAL | 0 refills | Status: DC
  Filled 2021-03-21: qty 4, 1d supply, fill #0

## 2021-03-22 ENCOUNTER — Other Ambulatory Visit (HOSPITAL_COMMUNITY): Payer: Self-pay

## 2021-03-22 MED ORDER — AMOXICILLIN 500 MG PO CAPS
2000.0000 mg | ORAL_CAPSULE | ORAL | 0 refills | Status: DC
  Filled 2021-03-22: qty 4, 1d supply, fill #0

## 2021-03-27 ENCOUNTER — Ambulatory Visit: Payer: 59 | Admitting: Nurse Practitioner

## 2021-04-01 ENCOUNTER — Ambulatory Visit: Payer: 59 | Admitting: Nurse Practitioner

## 2021-04-01 ENCOUNTER — Telehealth: Payer: Self-pay | Admitting: Nurse Practitioner

## 2021-04-01 ENCOUNTER — Other Ambulatory Visit: Payer: Self-pay

## 2021-04-01 ENCOUNTER — Encounter: Payer: Self-pay | Admitting: Nurse Practitioner

## 2021-04-01 VITALS — BP 128/90 | HR 78 | Resp 17 | Ht 64.0 in | Wt 241.0 lb

## 2021-04-01 DIAGNOSIS — M2540 Effusion, unspecified joint: Secondary | ICD-10-CM

## 2021-04-01 DIAGNOSIS — R002 Palpitations: Secondary | ICD-10-CM

## 2021-04-01 DIAGNOSIS — R7303 Prediabetes: Secondary | ICD-10-CM | POA: Diagnosis not present

## 2021-04-01 NOTE — Patient Instructions (Signed)
Please get your labs done tomorrow as planned.  It is important that you exercise regularly at least 30 minutes 5 times a week.  Think about what you will eat, plan ahead. Choose " clean, green, fresh or frozen" over canned, processed or packaged foods which are more sugary, salty and fatty. 70 to 75% of food eaten should be vegetables and fruit. Three meals at set times with snacks allowed between meals, but they must be fruit or vegetables. Aim to eat over a 12 hour period , example 7 am to 7 pm, and STOP after  your last meal of the day. Drink water,generally about 64 ounces per day, no other drink is as healthy. Fruit juice is best enjoyed in a healthy way, by EATING the fruit.  Thanks for choosing [facility], we consider it a privelige to serve you.

## 2021-04-01 NOTE — Assessment & Plan Note (Signed)
Check A1C today

## 2021-04-01 NOTE — Assessment & Plan Note (Signed)
Labs ordered today to rule out rheumatoid arthritis. Will refer to rheumatology if needed.  Patient told to continue Tylenol as needed. Denies pain today.

## 2021-04-01 NOTE — Telephone Encounter (Signed)
Please change lab orders to [facility].

## 2021-04-01 NOTE — Assessment & Plan Note (Signed)
Condition currently controlled.  Takes metoprolol 25 mg daily

## 2021-04-01 NOTE — Addendum Note (Signed)
Addended by: Cindra Presume D on: 04/01/2021 03:24 PM   Modules accepted: Orders

## 2021-04-01 NOTE — Assessment & Plan Note (Signed)
Importance of healthy food choices with portion control discussed as well as eating regularly within 12  hour window.   The need to choose clean green food 50%-75% of time is discussed as well as make water the primary drink and set a goal for 64 ounces daily.  Patient reeducated about the importance of committment to minimum of 150 minutes of exercise per week.  Three meals at set times with snacks allowed between meals but they must be fruit or vegetable.   Aim to eat  over 12 hour period  for example 7 am to 7 pm. Stop after your last meal of the day.  Wt Readings from Last 3 Encounters:  04/01/21 241 lb (109.3 kg)  01/09/21 245 lb 6 oz (111.3 kg)  12/13/20 242 lb 1.3 oz (109.8 kg)  does a lot walking at work.  Has had gasrtric bypass in 2019.

## 2021-04-01 NOTE — Progress Notes (Signed)
° °  Monica Hernandez     MRN: [id number]      DOB: 1975-08-05   HPI Monica Hernandez is here for follow up and re-evaluation of chronic medical conditions, medication management and review of any available recent lab and radiology data.  Preventive health is updated, specifically  Cancer screening and Immunization.   Questions or concerns regarding consultations or procedures which the PT has had in the interim are  addressed. The PT denies any adverse reactions to current medications since the last visit.  There are no new concerns.  There are no specific complaints   Pt stated that she is worried about antritis in her joints in both hands, shoulders , elbows and knees, she stated that they they get red and warm, then she gets some blisters on both hands and front of her legs. Symptoms comes and goes , ongoing for a year, her symptoms are getting worse. Usually worse when at work.   She stated that she doe snot have family history of RA her mother has osteoarthritis.Tylenol and pain patches helped her symptoms. She has been home for a week due to her dental  surgery. She currently denies pain and rashes. She does not come in contact with chemicals at work.   Pt stated that she had surgery to remove all her teeth last week due to having recurrent abscess, she has dentures now. Has a follow up appt with dentist on feb 10. Marland Kitchen She has been taking ibuprofen PRN for her swelling and pain. Denies fever, chills, drainage, takes tylenol as needed for pain   ROS Denies recent fever or chills. Denies sinus pressure, nasal congestion, ear pain or sore throat. Denies chest congestion, productive cough or wheezing. Denies chest pains, palpitations and leg swelling Denies abdominal pain, nausea, vomiting,diarrhea or constipation.   Denies dysuria, frequency, hesitancy or incontinence. Sometimes have joint pain, swelling and limitation in mobility. Denies headaches, seizures, numbness, or tingling. Denies  depression, anxiety or insomnia. Sometimes has rashes.   PE  BP 128/90    Pulse 78    Resp 17    Ht 5\' 4"  (1.626 m)    Wt 241 lb (109.3 kg)    SpO2 97%    BMI 41.37 kg/m   Patient alert and oriented and in no cardiopulmonary distress. Has dentures in place, unable to view gums. Signs of infection including severe pain, redness, drainage, fever chills revived with pt. Pt told to call surgeons if she has any of the signs   Chest: Clear to auscultation bilaterally.  CVS: S1, S2 no murmurs, no S3.Regular rate.  ABD: Soft non tender.   Ext: No edema  MS: Adequate ROM spine, shoulders, hips and knees.  Skin: Intact, no ulcerations or rash noted.  Psych: Good eye contact, normal affect. Memory intact not anxious or depressed appearing.    Assessment & Plan

## 2021-04-02 ENCOUNTER — Other Ambulatory Visit: Payer: Self-pay

## 2021-04-02 DIAGNOSIS — M2540 Effusion, unspecified joint: Secondary | ICD-10-CM

## 2021-04-02 DIAGNOSIS — R7303 Prediabetes: Secondary | ICD-10-CM

## 2021-04-02 DIAGNOSIS — Z13220 Encounter for screening for lipoid disorders: Secondary | ICD-10-CM

## 2021-04-02 NOTE — Telephone Encounter (Signed)
This has been taken care of and pt is aware

## 2021-04-03 ENCOUNTER — Other Ambulatory Visit (HOSPITAL_COMMUNITY): Payer: Self-pay

## 2021-04-03 MED ORDER — NYSTATIN 100000 UNIT/ML MT SUSP
10.0000 mL | Freq: Four times a day (QID) | OROMUCOSAL | 1 refills | Status: DC
  Filled 2021-04-03: qty 300, 7d supply, fill #0

## 2021-04-10 DIAGNOSIS — R7303 Prediabetes: Secondary | ICD-10-CM | POA: Diagnosis not present

## 2021-04-10 DIAGNOSIS — M2540 Effusion, unspecified joint: Secondary | ICD-10-CM | POA: Diagnosis not present

## 2021-04-10 DIAGNOSIS — Z13220 Encounter for screening for lipoid disorders: Secondary | ICD-10-CM | POA: Diagnosis not present

## 2021-04-12 LAB — COMPLETE METABOLIC PANEL WITH GFR
AG Ratio: 1.7 (calc) (ref 1.0–2.5)
ALT: 12 U/L (ref 6–29)
AST: 18 U/L (ref 10–35)
Albumin: 4.3 g/dL (ref 3.6–5.1)
Alkaline phosphatase (APISO): 77 U/L (ref 31–125)
BUN: 12 mg/dL (ref 7–25)
CO2: 30 mmol/L (ref 20–32)
Calcium: 9.4 mg/dL (ref 8.6–10.2)
Chloride: 103 mmol/L (ref 98–110)
Creat: 0.74 mg/dL (ref 0.50–0.99)
Globulin: 2.6 g/dL (calc) (ref 1.9–3.7)
Glucose, Bld: 78 mg/dL (ref 65–99)
Potassium: 4.3 mmol/L (ref 3.5–5.3)
Sodium: 139 mmol/L (ref 135–146)
Total Bilirubin: 0.4 mg/dL (ref 0.2–1.2)
Total Protein: 6.9 g/dL (ref 6.1–8.1)
eGFR: 100 mL/min/{1.73_m2} (ref >=60)

## 2021-04-12 LAB — HEMOGLOBIN A1C
Hgb A1c MFr Bld: 5.6 % of total Hgb (ref <5.7)
Mean Plasma Glucose: 114 mg/dL
eAG (mmol/L): 6.3 mmol/L

## 2021-04-12 LAB — LIPID PANEL
Cholesterol: 165 mg/dL (ref <200)
HDL: 61 mg/dL (ref >=50)
LDL Cholesterol (Calc): 87 mg/dL (calc)
Non-HDL Cholesterol (Calc): 104 mg/dL (calc) (ref <130)
Total CHOL/HDL Ratio: 2.7 (calc) (ref <5.0)
Triglycerides: 80 mg/dL (ref <150)

## 2021-04-12 LAB — C-REACTIVE PROTEIN: CRP: 1.9 mg/L (ref <8.0)

## 2021-04-12 LAB — SEDIMENTATION RATE: Sed Rate: 17 mm/h (ref 0–20)

## 2021-04-12 LAB — ANA: Anti Nuclear Antibody (ANA): NEGATIVE

## 2021-04-24 ENCOUNTER — Encounter: Payer: Self-pay | Admitting: Nurse Practitioner

## 2021-04-24 ENCOUNTER — Encounter: Payer: Self-pay | Admitting: *Deleted

## 2021-04-24 NOTE — Telephone Encounter (Signed)
Please advise 

## 2021-04-26 NOTE — Telephone Encounter (Signed)
Called pt to discuss the option of trying wegovy and having the pt contact her insurance to see if they will cover it. No answer left vm to call back

## 2021-04-29 ENCOUNTER — Other Ambulatory Visit (HOSPITAL_COMMUNITY): Payer: Self-pay

## 2021-05-03 ENCOUNTER — Other Ambulatory Visit (HOSPITAL_COMMUNITY): Payer: Self-pay

## 2021-05-03 ENCOUNTER — Other Ambulatory Visit: Payer: Self-pay | Admitting: Nurse Practitioner

## 2021-05-03 MED ORDER — SEMAGLUTIDE-WEIGHT MANAGEMENT 0.5 MG/0.5ML ~~LOC~~ SOAJ
0.5000 mg | SUBCUTANEOUS | 0 refills | Status: DC
  Filled 2021-05-03 – 2021-06-11 (×2): qty 2, 28d supply, fill #0

## 2021-05-03 MED ORDER — SEMAGLUTIDE-WEIGHT MANAGEMENT 0.25 MG/0.5ML ~~LOC~~ SOAJ
0.2500 mg | SUBCUTANEOUS | 0 refills | Status: DC
  Filled 2021-05-03 – 2021-05-21 (×4): qty 2, 28d supply, fill #0

## 2021-05-03 NOTE — Telephone Encounter (Signed)
Spoke to pt advised of Fola's message she verbalized understanding

## 2021-05-03 NOTE — Telephone Encounter (Signed)
Spoke with pt she states that what happened is that when [facility] increased her dosage of the Saxenda it would make her blood sugar drop and they wouldn't change her back to the low dose. Advised to pt Fola's concern of trying Wegovy and it's similar composition, pt is wanting to know if she can try it and if there is an increase it can be done slowly not rapidly. She states that her body doesn't tolerate increasing medication at a rapid pace. Please advise.

## 2021-05-07 ENCOUNTER — Other Ambulatory Visit (HOSPITAL_COMMUNITY): Payer: Self-pay

## 2021-05-08 ENCOUNTER — Other Ambulatory Visit (HOSPITAL_COMMUNITY): Payer: Self-pay

## 2021-05-21 ENCOUNTER — Other Ambulatory Visit (HOSPITAL_COMMUNITY): Payer: Self-pay

## 2021-06-05 ENCOUNTER — Other Ambulatory Visit (HOSPITAL_COMMUNITY): Payer: Self-pay

## 2021-06-11 ENCOUNTER — Other Ambulatory Visit (HOSPITAL_COMMUNITY): Payer: Self-pay

## 2021-06-12 ENCOUNTER — Other Ambulatory Visit (HOSPITAL_COMMUNITY): Payer: Self-pay

## 2021-07-03 ENCOUNTER — Other Ambulatory Visit: Payer: Self-pay | Admitting: Nurse Practitioner

## 2021-07-03 ENCOUNTER — Encounter: Payer: Self-pay | Admitting: Nurse Practitioner

## 2021-07-03 ENCOUNTER — Other Ambulatory Visit (HOSPITAL_COMMUNITY): Payer: Self-pay

## 2021-07-03 MED ORDER — WEGOVY 1 MG/0.5ML ~~LOC~~ SOAJ
1.0000 mg | SUBCUTANEOUS | 1 refills | Status: DC
  Filled 2021-07-03: qty 2, 28d supply, fill #0
  Filled 2021-07-30: qty 2, 28d supply, fill #1

## 2021-07-03 MED ORDER — WEGOVY 0.5 MG/0.5ML ~~LOC~~ SOAJ
0.5000 mg | SUBCUTANEOUS | 0 refills | Status: DC
  Filled 2021-07-10: qty 2, 28d supply, fill #0

## 2021-07-03 NOTE — Telephone Encounter (Signed)
Please advise pt scheduled for 6/9 ?

## 2021-07-10 ENCOUNTER — Other Ambulatory Visit (HOSPITAL_COMMUNITY): Payer: Self-pay

## 2021-07-30 ENCOUNTER — Other Ambulatory Visit: Payer: Self-pay | Admitting: Nurse Practitioner

## 2021-07-30 ENCOUNTER — Other Ambulatory Visit (HOSPITAL_COMMUNITY): Payer: Self-pay

## 2021-07-30 MED ORDER — SEMAGLUTIDE-WEIGHT MANAGEMENT 2.4 MG/0.75ML ~~LOC~~ SOAJ
2.4000 mg | SUBCUTANEOUS | 0 refills | Status: DC
  Filled 2021-07-30 – 2021-09-26 (×3): qty 3, 28d supply, fill #0

## 2021-07-30 MED ORDER — SEMAGLUTIDE-WEIGHT MANAGEMENT 1.7 MG/0.75ML ~~LOC~~ SOAJ
1.7000 mg | SUBCUTANEOUS | 0 refills | Status: DC
Start: 2021-08-13 — End: 2021-08-28
  Filled 2021-07-30: qty 3, 28d supply, fill #0

## 2021-07-30 NOTE — Telephone Encounter (Signed)
Please advise 

## 2021-07-31 ENCOUNTER — Other Ambulatory Visit (HOSPITAL_COMMUNITY): Payer: Self-pay

## 2021-08-01 ENCOUNTER — Encounter: Payer: 59 | Admitting: Nurse Practitioner

## 2021-08-16 ENCOUNTER — Ambulatory Visit: Payer: 59 | Admitting: Nurse Practitioner

## 2021-08-16 ENCOUNTER — Encounter: Payer: Self-pay | Admitting: Nurse Practitioner

## 2021-08-16 VITALS — BP 124/86 | HR 76 | Ht 64.0 in | Wt 233.0 lb

## 2021-08-16 DIAGNOSIS — Z1211 Encounter for screening for malignant neoplasm of colon: Secondary | ICD-10-CM

## 2021-08-16 DIAGNOSIS — Z9884 Bariatric surgery status: Secondary | ICD-10-CM | POA: Diagnosis not present

## 2021-08-16 DIAGNOSIS — Z0000 Encounter for general adult medical examination without abnormal findings: Secondary | ICD-10-CM | POA: Diagnosis not present

## 2021-08-16 NOTE — Progress Notes (Signed)
Complete physical exam  Patient: Monica Hernandez   DOB: 09-10-1975   46 y.o. Female  MRN: [id number]  Subjective:    Chief Complaint  Patient presents with   Annual Exam    cpe   Weight Check    Wegovy f/u    Monica Hernandez with past medical history of asthma, migraine, morbid obesity, prediabetes, palpitation is a 46 y.o. female who presents today for a complete physical exam. She reports consuming a low fat and low sodium diet.she has been walking 6-7 miles 5 days a week, goes to the [facility]  She generally feels well. She reports sleeping well. She does not  have additional problems to discuss today.    Due for colon cancer screening referral sent to GI today  Most recent fall risk assessment:    08/16/2021    3:42 PM  [place] in the past year? 0  Number falls in past yr: 0  Injury with Fall? 0  Risk for fall due to : No Fall Risks  Follow up Falls evaluation completed     Most recent depression screenings:    08/16/2021    3:42 PM 04/01/2021    8:35 AM  PHQ 2/9 Scores  PHQ - 2 Score 0 0        Patient Care Team: Renee Rival, FNP as PCP - General (Nurse Practitioner) Josue Hector, MD as PCP - Cardiology (Cardiology)   Outpatient Medications Prior to Visit  Medication Sig Note   acetaminophen (TYLENOL) 500 MG tablet Take 1,000 mg by mouth every 6 (six) hours as needed for mild pain or moderate pain.    albuterol (VENTOLIN HFA) 108 (90 Base) MCG/ACT inhaler Inhale 2 puffs into the lungs every 6 (six) hours as needed for wheezing or shortness of breath.    EPINEPHrine 0.3 mg/0.3 mL IJ SOAJ injection Inject 0.3 mg into the muscle as needed for anaphylaxis.    famotidine (PEPCID) 40 MG tablet Take 1 tablet (40 mg total) by mouth 2 (two) times daily.    furosemide (LASIX) 20 MG tablet Take 20 mg daily as needed for swelling, weight gain    meclizine (ANTIVERT) 25 MG tablet Take 1 tablet (25 mg total) by mouth 3 (three) times daily as  needed for dizziness.    metoprolol succinate (TOPROL-XL) 25 MG 24 hr tablet Take 1 tablet (25 mg total) by mouth daily. 08/16/2021: Taking 1/2 a tablet   Multiple Vitamins-Minerals (BARIATRIC MULTIVITAMINS/IRON PO) Take 2 tablets by mouth 2 (two) times daily. Chewable tablets    Semaglutide-Weight Management (WEGOVY) 1 MG/0.5ML SOAJ Inject 1 mg into the skin once a week.    Semaglutide-Weight Management 1.7 MG/0.75ML SOAJ Inject 1.7 mg into the skin once a week for 28 days.    [START ON 09/17/2021] Semaglutide-Weight Management 2.4 MG/0.75ML SOAJ Inject 2.4 mg into the skin once a week for 28 days.    nystatin (MYCOSTATIN) 100000 UNIT/ML suspension Rinse with 33ms, swish for 2 minutes and swallow 4 times a day. Add 518m to water that dentures soak in. (Patient not taking: Reported on 08/16/2021)    [DISCONTINUED] butalbital-acetaminophen-caffeine (FIORICET) 50-325-40 MG tablet Take 1-2 tablets by mouth every 4 (four) hours as needed for headache. Max 6 caps/day    [DISCONTINUED] esomeprazole (NEXIUM) 40 MG capsule Take 1 capsule (40 mg total) by mouth daily. (Patient not taking: Reported on 04/01/2021)    [DISCONTINUED] ipratropium (ATROVENT) 0.03 % nasal spray Place 2  sprays into both nostrils every 12 (twelve) hours.    No facility-administered medications prior to visit.    Review of Systems  Constitutional:  Negative for chills, fever and weight loss.  HENT: Negative.  Negative for ear pain, hearing loss and tinnitus.   Eyes: Negative.  Negative for blurred vision, double vision and photophobia.  Respiratory: Negative.  Negative for cough, hemoptysis and sputum production.   Cardiovascular: Negative.  Negative for chest pain, palpitations and orthopnea.  Gastrointestinal: Negative.  Negative for heartburn, nausea and vomiting.  Genitourinary: Negative.  Negative for dysuria, frequency and urgency.  Musculoskeletal: Negative.  Negative for joint pain, myalgias and neck pain.  Skin: Negative.   Negative for itching and rash.  Neurological: Negative.  Negative for dizziness, tingling and headaches.  Endo/Heme/Allergies: Negative.  Negative for environmental allergies. Does not bruise/bleed easily.  Psychiatric/Behavioral: Negative.  Negative for depression, substance abuse and suicidal ideas.           Objective:  Patient declined breast exam today   BP 124/86 (BP Location: Right Arm, Patient Position: Sitting, Cuff Size: Large)   Pulse 76   Ht '5\' 4"'  (1.626 m)   Wt 233 lb (105.7 kg)   SpO2 96%   BMI 39.99 kg/m    Physical Exam Vitals and nursing note reviewed.  Constitutional:      General: She is not in acute distress.    Appearance: She is obese. She is not ill-appearing, toxic-appearing or diaphoretic.  HENT:     Head: Normocephalic and atraumatic.     Right Ear: Tympanic membrane, ear canal and external ear normal. There is no impacted cerumen.     Left Ear: Tympanic membrane, ear canal and external ear normal. There is no impacted cerumen.     Nose: Nose normal. No congestion or rhinorrhea.     Mouth/Throat:     Mouth: Mucous membranes are moist.     Pharynx: Oropharynx is clear. No oropharyngeal exudate or posterior oropharyngeal erythema.  Eyes:     General: No scleral icterus.       Right eye: No discharge.        Left eye: No discharge.     Conjunctiva/sclera: Conjunctivae normal.     Pupils: Pupils are equal, round, and reactive to light.  Neck:     Vascular: No carotid bruit.  Cardiovascular:     Rate and Rhythm: Normal rate and regular rhythm.     Pulses: Normal pulses.     Heart sounds: Normal heart sounds. No murmur heard.    No friction rub. No gallop.  Pulmonary:     Effort: Pulmonary effort is normal. No respiratory distress.     Breath sounds: Normal breath sounds. No stridor. No wheezing, rhonchi or rales.  Chest:     Chest wall: No tenderness.  Abdominal:     General: There is no distension.     Palpations: Abdomen is soft. There is  no mass.     Tenderness: There is no abdominal tenderness. There is no right CVA tenderness, left CVA tenderness, guarding or rebound.     Hernia: No hernia is present.  Musculoskeletal:        General: No swelling, tenderness, deformity or signs of injury. Normal range of motion.     Cervical back: Normal range of motion and neck supple. No rigidity or tenderness.     Right lower leg: No edema.     Left lower leg: No edema.  Lymphadenopathy:  Cervical: No cervical adenopathy.  Skin:    General: Skin is warm and dry.     Capillary Refill: Capillary refill takes less than 2 seconds.     Coloration: Skin is not jaundiced or pale.     Findings: No bruising, erythema, lesion or rash.  Neurological:     Mental Status: She is alert and oriented to person, place, and time.     Cranial Nerves: No cranial nerve deficit.     Sensory: No sensory deficit.     Motor: No weakness.     Coordination: Coordination normal.     Gait: Gait normal.     Deep Tendon Reflexes: Reflexes normal.  Psychiatric:        Mood and Affect: Mood normal.        Behavior: Behavior normal.        Thought Content: Thought content normal.        Judgment: Judgment normal.      No results found for any visits on 08/16/21.     Assessment & Plan:    Routine Health Maintenance and Physical Exam  Immunization History  Administered Date(s) Administered   Influenza,inj,Quad PF,6+ Mos 11/28/2020   Influenza-Unspecified 12/01/2016   PFIZER(Purple Top)SARS-COV-2 Vaccination 10/09/2019, 11/09/2019   Tdap 10/10/2013    Health Maintenance  Topic Date Due   COLONOSCOPY (Pts 45-48yr Insurance coverage will need to be confirmed)  Never done   COVID-19 Vaccine (3 - [place]) 08/23/2021 (Originally 01/04/2020)   INFLUENZA VACCINE  10/08/2021   TETANUS/TDAP  10/11/2023   Hepatitis C Screening  Completed   HIV Screening  Completed   HPV VACCINES  Aged Out   PAP SMEAR-Modifier  Discontinued    Discussed  health benefits of physical activity, and encouraged her to engage in regular exercise appropriate for her age and condition.  Problem List Items Addressed This Visit       Other   Morbid obesity ([place]    Wt Readings from Last 3 Encounters:  08/16/21 233 lb (105.7 kg)  04/01/21 241 lb (109.3 kg)  01/09/21 245 lb 6 oz (111.3 kg)  Current BMI 39.9 , he has lost 8 pounds since last visit ,she has been lossing a lot of inches off her waist, hips, joint feeling better .  Currently doing well on Wegovy 1.77monce weekly injection, continue current medication. Patient counseled on low-carb diet, encouraged to continue to eat exercise regularly at least 150 minutes weekly Follow-up in 3 months CMP today      Relevant Orders   CMP14+EGFR   Annual physical exam - Primary    Annual exam as documented.  Counseling done include healthy lifestyle involving committing to 150 minutes of exercise per week, heart healthy diet, and attaining healthy weight. The importance of adequate sleep also discussed.  Regular use of seat belt and home safety were also discussed . Changes in health habits are decided on by patient with goals and time frames set for achieving them. Patient referred for colon cancer screening.        Relevant Orders   TSH   Vitamin D (25 hydroxy)   B12 and Folate Panel   CBC with Differential   History of gastric bypass    Would like her B12 and folate, vitamin D levels check Taking multivitamins currently      Other Visit Diagnoses     Screening for colon cancer       Relevant Orders   Ambulatory referral to Gastroenterology  Return in about 3 months (around 11/16/2021) for weight management .     Renee Rival, FNP

## 2021-08-16 NOTE — Assessment & Plan Note (Addendum)
Wt Readings from Last 3 Encounters:  08/16/21 233 lb (105.7 kg)  04/01/21 241 lb (109.3 kg)  01/09/21 245 lb 6 oz (111.3 kg)   Current BMI 39.9 , he has lost 8 pounds since last visit ,she has been lossing a lot of inches off her waist, hips, joint feeling better .  Currently doing well on Wegovy 1.7mg  once weekly injection, continue current medication. Patient counseled on low-carb diet, encouraged to continue to eat exercise regularly at least 150 minutes weekly Follow-up in 3 months CMP today

## 2021-08-16 NOTE — Assessment & Plan Note (Signed)
Annual exam as documented.  Counseling done include healthy lifestyle involving committing to 150 minutes of exercise per week, heart healthy diet, and attaining healthy weight. The importance of adequate sleep also discussed.  Regular use of seat belt and home safety were also discussed . Changes in health habits are decided on by patient with goals and time frames set for achieving them. Patient referred for colon cancer screening.

## 2021-08-16 NOTE — Patient Instructions (Signed)

## 2021-08-16 NOTE — Assessment & Plan Note (Signed)
Would like her B12 and folate, vitamin D levels check Taking multivitamins currently

## 2021-08-17 LAB — CBC WITH DIFFERENTIAL/PLATELET
Basophils Absolute: 0 10*3/uL (ref 0.0–0.2)
Basos: 1 %
EOS (ABSOLUTE): 0.2 10*3/uL (ref 0.0–0.4)
Eos: 3 %
Hematocrit: 38.3 % (ref 34.0–46.6)
Hemoglobin: 12.6 g/dL (ref 11.1–15.9)
Immature Grans (Abs): 0 10*3/uL (ref 0.0–0.1)
Immature Granulocytes: 0 %
Lymphocytes Absolute: 3.3 10*3/uL — ABNORMAL HIGH (ref 0.7–3.1)
Lymphs: 46 %
MCH: 28.8 pg (ref 26.6–33.0)
MCHC: 32.9 g/dL (ref 31.5–35.7)
MCV: 87 fL (ref 79–97)
Monocytes Absolute: 0.4 10*3/uL (ref 0.1–0.9)
Monocytes: 6 %
Neutrophils Absolute: 3.1 10*3/uL (ref 1.4–7.0)
Neutrophils: 44 %
Platelets: 253 10*3/uL (ref 150–450)
RBC: 4.38 x10E6/uL (ref 3.77–5.28)
RDW: 13.1 % (ref 11.7–15.4)
WBC: 7.1 10*3/uL (ref 3.4–10.8)

## 2021-08-17 LAB — CMP14+EGFR
ALT: 12 IU/L (ref 0–32)
AST: 20 IU/L (ref 0–40)
Albumin/Globulin Ratio: 1.6 (ref 1.2–2.2)
Albumin: 4.5 g/dL (ref 3.8–4.8)
Alkaline Phosphatase: 93 IU/L (ref 44–121)
BUN/Creatinine Ratio: 14 (ref 9–23)
BUN: 11 mg/dL (ref 6–24)
Bilirubin Total: <0.2 mg/dL (ref 0.0–1.2)
CO2: 26 mmol/L (ref 20–29)
Calcium: 9.7 mg/dL (ref 8.7–10.2)
Chloride: 99 mmol/L (ref 96–106)
Creatinine, Ser: 0.81 mg/dL (ref 0.57–1.00)
Globulin, Total: 2.8 g/dL (ref 1.5–4.5)
Glucose: 95 mg/dL (ref 70–99)
Potassium: 4.6 mmol/L (ref 3.5–5.2)
Sodium: 139 mmol/L (ref 134–144)
Total Protein: 7.3 g/dL (ref 6.0–8.5)
eGFR: 90 mL/min/{1.73_m2} (ref >59)

## 2021-08-17 LAB — VITAMIN D 25 HYDROXY (VIT D DEFICIENCY, FRACTURES): Vit D, 25-Hydroxy: 32.5 ng/mL (ref 30.0–100.0)

## 2021-08-17 LAB — B12 AND FOLATE PANEL
Folate: >20 ng/mL (ref >3.0)
Vitamin B-12: 631 pg/mL (ref 232–1245)

## 2021-08-17 LAB — TSH: TSH: 2.72 u[IU]/mL (ref 0.450–4.500)

## 2021-08-18 ENCOUNTER — Other Ambulatory Visit: Payer: Self-pay | Admitting: Nurse Practitioner

## 2021-08-18 DIAGNOSIS — E559 Vitamin D deficiency, unspecified: Secondary | ICD-10-CM

## 2021-08-18 MED ORDER — VITAMIN D3 25 MCG (1000 UT) PO CAPS
1000.0000 [IU] | ORAL_CAPSULE | Freq: Every day | ORAL | 3 refills | Status: AC
  Filled 2021-08-18 – 2022-01-10 (×3): qty 60, 60d supply, fill #0

## 2021-08-18 NOTE — Progress Notes (Signed)
Labs are normal Normal vitamin B12 and folate, vitamin D is at low end of normal, patient should start taking vitamin D 1000 units daily

## 2021-08-19 ENCOUNTER — Encounter: Payer: Self-pay | Admitting: Nurse Practitioner

## 2021-08-19 ENCOUNTER — Other Ambulatory Visit (HOSPITAL_COMMUNITY): Payer: Self-pay

## 2021-08-20 ENCOUNTER — Other Ambulatory Visit (HOSPITAL_COMMUNITY): Payer: Self-pay

## 2021-08-22 NOTE — Progress Notes (Signed)
Cardiology Office Note    Date:  08/23/2021   ID:  Monica Hernandez, DOB 04/12/1975, MRN [id number]  PCP:  Donell Beers, FNP  Cardiologist: Charlton Haws, MD    Chief Complaint  Patient presents with   Follow-up    Annual Visit    History of Present Illness:    Monica Hernandez is a 46 y.o. female with past medical history of palpitations (PAC's and PVC's by prior monitor), GERD and history of gastric bypass who presents to the office today for 1 year follow-up.  She was last examined by myself in 06/2020 and reported having occasional palpitations with associated dyspnea and dizziness. She was consuming a significant amount of caffeine including at least 3 pots of coffee a day along with tea and soda. She was encouraged to reduce her caffeine intake and was started on Toprol-XL 25 mg daily as she previously was intolerant to short-acting Lopressor.  In talking with the patient today, she reports overall doing well since her last office visit. She remains active at baseline as she continues to work 2 jobs at [employer] and [employer] and also [employer]. Reports her palpitations have significantly improved with reduction in her caffeine intake and also due to reduced stress.  Says her PCP previously told her to reduce Toprol-XL to 12.5 mg daily secondary to soft BP. She reports occasional episodes of shooting pain but no exertional chest pain or dyspnea on exertion. No recent orthopnea, PND or pitting edema. She previously only experienced lower extremity edema after being on a long car ride.   Past Medical History:  Diagnosis Date   Allergy    Anemia    Asthma    Eczema    GERD (gastroesophageal reflux disease)    History of kidney stones    Hypertension    Kidney stone    Mitral valve prolapse    was told as a kid that she had this but during ECHO for eval, ECHO did not indicate this , see 08-25-16 epic result    PONV (postoperative nausea and vomiting)    epidural with c  section caused N/V    Pre-diabetes    Recurrent upper respiratory infection (URI)    Ulcer     Past Surgical History:  Procedure Laterality Date   ABDOMINAL HYSTERECTOMY     total   ADENOIDECTOMY     ADENOIDECTOMY, TONSILLECTOMY AND MYRINGOTOMY WITH TUBE PLACEMENT     CESAREAN SECTION     FRACTURE SURGERY     GASTRIC ROUX-EN-Y N/A 03/31/2017   Procedure: LAPAROSCOPIC ROUX-EN-Y GASTRIC BYPASS WITH UPPER ENDOSCOPY;  Surgeon: Sheliah Hatch, De Blanch, MD;  Location: [facility];  Service: General;  Laterality: N/A;   HERNIA REPAIR     umbilical   LEG SURGERY Left    MULTIPLE TOOTH EXTRACTIONS Bilateral 2023   TONSILLECTOMY     TUBAL LIGATION      Current Medications: Outpatient Medications Prior to Visit  Medication Sig Dispense Refill   acetaminophen (TYLENOL) 500 MG tablet Take 1,000 mg by mouth every 6 (six) hours as needed for mild pain or moderate pain.     albuterol (VENTOLIN HFA) 108 (90 Base) MCG/ACT inhaler Inhale 2 puffs into the lungs every 6 (six) hours as needed for wheezing or shortness of breath. 8 g 0   Cholecalciferol (VITAMIN D3) 25 MCG (1000 UT) CAPS Take 1 capsule (1,000 Units total) by mouth daily. 60 capsule 3   EPINEPHrine 0.3 mg/0.3 mL IJ SOAJ  injection Inject 0.3 mg into the muscle as needed for anaphylaxis. 2 each 1   famotidine (PEPCID) 40 MG tablet Take 1 tablet (40 mg total) by mouth 2 (two) times daily. 60 tablet 3   furosemide (LASIX) 20 MG tablet Take 20 mg daily as needed for swelling, weight gain 90 tablet 3   meclizine (ANTIVERT) 25 MG tablet Take 1 tablet (25 mg total) by mouth 3 (three) times daily as needed for dizziness. 30 tablet 0   Multiple Vitamins-Minerals (BARIATRIC MULTIVITAMINS/IRON PO) Take 2 tablets by mouth 2 (two) times daily. Chewable tablets     nystatin (MYCOSTATIN) 100000 UNIT/ML suspension Rinse with , swish for 2 minutes and swallow 4 times a day. Add to water that dentures soak in. 300 mL 1   Semaglutide-Weight Management  (WEGOVY) 1 MG/0.5ML SOAJ Inject 1 mg into the skin once a week. 2 mL 1   Semaglutide-Weight Management 1.7 MG/0.75ML SOAJ Inject 1.7 mg into the skin once a week for 28 days. 3 mL 0   [START ON 09/17/2021] Semaglutide-Weight Management 2.4 MG/0.75ML SOAJ Inject 2.4 mg into the skin once a week for 28 days. 3 mL 0   metoprolol succinate (TOPROL-XL) 25 MG 24 hr tablet Take 1 tablet (25 mg total) by mouth daily. 90 tablet 3   No facility-administered medications prior to visit.     Allergies:   Bee venom, Contrave [naltrexone-bupropion hcl er], Erythromycin, Shellfish allergy, Zithromax [azithromycin], Belviq [lorcaserin hcl], Liraglutide -weight management, Qsymia [phentermine-topiramate], Allegra-d [fexofenadine-pseudoephed er], Levocetirizine, Methylprednisolone, Naltrexone, Fish allergy, and Sulfa antibiotics   Social History   Socioeconomic History   Marital status: Married    Spouse name: Not on file   Number of children: 1   Years of education: Not on file   Highest education level: Not on file  Occupational History    Employer: [employer]    Comment: heart care    Comment: [employer]  Tobacco Use   Smoking status: Former    Packs/day: 0.50    Years: 15.00    Total pack years: 7.50    Types: Cigarettes    Quit date: 2010    Years since quitting: 13.4   Smokeless tobacco: Never   Tobacco comments:    smoked since age 40 ; quit in 2010   Vaping Use   Vaping Use: Never used  Substance and Sexual Activity   Alcohol use: No   Drug use: No   Sexual activity: Yes    Partners: Male    Birth control/protection: Surgical  Other Topics Concern   Not on file  Social History Narrative   Live with her spouse ans her son   Social Determinants of Health   Financial Resource Strain: Not on file  Food Insecurity: Not on file  Transportation Needs: Not on file  Physical Activity: Not on file  Stress: Not on file  Social Connections: Not on file     Family History:  The  patient's family history includes Asthma in an other family member; Bone cancer in her father; Brain cancer in her father; Cancer in an other family member; Diabetes in her maternal grandmother; Heart disease in her maternal grandmother; Hypertension in her brother, mother, and sister; Lung cancer in her father; Stroke in her father and mother; Thyroid cancer in her mother and sister.   Review of Systems:    Please see the history of present illness.     All other systems reviewed and are otherwise negative except  as noted above.   Physical Exam:    VS:  BP 116/64   Pulse 70   Ht 5\' 4"  (1.626 m)   Wt 232 lb (105.2 kg)   SpO2 98%   BMI 39.82 kg/m    General: Well developed, well nourished,female appearing in no acute distress. Head: Normocephalic, atraumatic. Neck: No carotid bruits. JVD not elevated.  Lungs: Respirations regular and unlabored, without wheezes or rales.  Heart: Regular rate and rhythm. No S3 or S4.  No murmur, no rubs, or gallops appreciated. Abdomen: Appears non-distended. No obvious abdominal masses. Msk:  Strength and tone appear normal for age. No obvious joint deformities or effusions. Extremities: No clubbing or cyanosis. No edema.  Distal pedal pulses are 2+ bilaterally. Neuro: Alert and oriented X 3. Moves all extremities spontaneously. No focal deficits noted. Psych:  Responds to questions appropriately with a normal affect. Skin: No rashes or lesions noted  Wt Readings from Last 3 Encounters:  08/23/21 232 lb (105.2 kg)  08/16/21 233 lb (105.7 kg)  04/01/21 241 lb (109.3 kg)     Studies/Labs Reviewed:   EKG:  EKG is ordered today.  The ekg ordered today demonstrates normal sinus rhythm, heart rate 70 with no acute ST changes.  Recent Labs: 08/16/2021: ALT 12; BUN 11; Creatinine, Ser 0.81; Hemoglobin 12.6; Platelets 253; Potassium 4.6; Sodium 139; TSH 2.720   Lipid Panel    Component Value Date/Time   CHOL 165 04/10/2021 0711   CHOL 169 07/06/2017  1421   TRIG 80 04/10/2021 0711   HDL 61 04/10/2021 0711   HDL 50 07/06/2017 1421   CHOLHDL 2.7 04/10/2021 0711   VLDL 8 08/02/2020 0827   LDLCALC 87 04/10/2021 0711    Additional studies/ records that were reviewed today include:   Coronary Calcium Score: 11/2018 FINDINGS: Non-cardiac: See separate report from [facility].   Ascending aorta: Normal diameter 3.2 cm   Pericardium: Normal   Coronary arteries: No calcium noted   IMPRESSION: Coronary calcium score of 0.  Echocardiogram: 09/2019 IMPRESSIONS     1. Left ventricular ejection fraction, by estimation, is 55 to 60%. The  left ventricle has normal function. The left ventricle has no regional  wall motion abnormalities. Left ventricular diastolic parameters were  normal.   2. Right ventricular systolic function is normal. The right ventricular  size is normal. There is normal pulmonary artery systolic pressure. The  estimated right ventricular systolic pressure is 18.8 mmHg.   3. Left atrial size was upper normal.   4. The mitral valve is grossly normal. Trivial mitral valve  regurgitation.   5. The aortic valve is tricuspid. Aortic valve regurgitation is not  visualized.   6. The inferior vena cava is dilated in size with >50% respiratory  variability, suggesting right atrial pressure of 8 mmHg.   Event Monitor: 09/2019 NSR Average HR 74 bpm Rare PAC/PVC < 1% beats No significant arrhythmias  Assessment:    1. Palpitations   2. History of chest pain   3. Lower extremity edema      Plan:   In order of problems listed above:  1. Palpitations - Symptoms have overall improved with reduction in caffeine intake and also improvement in her stress level. Recent labs earlier this month showed her TSH and electrolytes were within a normal range. She has been taking Toprol-XL 12.5 mg daily and we reviewed that she could take an extra tablet if needed for breakthrough palpitations.    2. History of  Chest Pain - She reports occasional shooting pains but no recent exertional symptoms. She did have a coronary calcium score of 0 by CT in 11/2018. Continue with risk factor modification.  3. Lower Extremity Edema - This was an isolated episode after a car trip with no recurrence since. She does have a prescription for Lasix 20 mg to take as needed for worsening swelling or weight gain.   Medication Adjustments/Labs and Tests Ordered: Current medicines are reviewed at length with the patient today.  Concerns regarding medicines are outlined above.  Medication changes, Labs and Tests ordered today are listed in the Patient Instructions below. Patient Instructions  Medication Instructions:  Your physician recommends that you continue on your current medications as directed. Please refer to the Current Medication list given to you today.   Labwork: None today  Testing/Procedures: None today  Follow-Up: 1 year  Any Other Special Instructions Will Be Listed Below (If Applicable).  If you need a refill on your cardiac medications before your next appointment, please call your pharmacy.   Signed, Ellsworth Lennox, PA-C  08/23/2021 4:11 PM    [facility] [address] [place], [place] [address] Phone: [phone] Fax: [phone]

## 2021-08-23 ENCOUNTER — Encounter: Payer: Self-pay | Admitting: Student

## 2021-08-23 ENCOUNTER — Other Ambulatory Visit (HOSPITAL_COMMUNITY): Payer: Self-pay

## 2021-08-23 ENCOUNTER — Ambulatory Visit: Payer: 59 | Admitting: Student

## 2021-08-23 VITALS — BP 116/64 | HR 70 | Ht 64.0 in | Wt 232.0 lb

## 2021-08-23 DIAGNOSIS — Z87898 Personal history of other specified conditions: Secondary | ICD-10-CM | POA: Diagnosis not present

## 2021-08-23 DIAGNOSIS — R600 Localized edema: Secondary | ICD-10-CM | POA: Diagnosis not present

## 2021-08-23 DIAGNOSIS — R002 Palpitations: Secondary | ICD-10-CM | POA: Diagnosis not present

## 2021-08-23 MED ORDER — METOPROLOL SUCCINATE ER 25 MG PO TB24
25.0000 mg | ORAL_TABLET | Freq: Every day | ORAL | 3 refills | Status: DC
Start: 2021-08-23 — End: 2022-10-08
  Filled 2021-08-28: qty 90, 90d supply, fill #0
  Filled 2022-01-10: qty 90, 90d supply, fill #1
  Filled 2022-05-05: qty 90, 90d supply, fill #2
  Filled 2022-07-31: qty 90, 90d supply, fill #3

## 2021-08-23 MED ORDER — METOPROLOL SUCCINATE ER 25 MG PO TB24
25.0000 mg | ORAL_TABLET | Freq: Every day | ORAL | 3 refills | Status: DC
Start: 2021-08-23 — End: 2021-08-23
  Filled 2021-08-23: qty 90, 90d supply, fill #0

## 2021-08-23 NOTE — Patient Instructions (Signed)
Medication Instructions:  Your physician recommends that you continue on your current medications as directed. Please refer to the Current Medication list given to you today.   Labwork: None today  Testing/Procedures: None today  Follow-Up: 1 year  Any Other Special Instructions Will Be Listed Below (If Applicable).  If you need a refill on your cardiac medications before your next appointment, please call your pharmacy.  

## 2021-08-28 ENCOUNTER — Other Ambulatory Visit (HOSPITAL_COMMUNITY): Payer: Self-pay

## 2021-08-28 ENCOUNTER — Other Ambulatory Visit: Payer: Self-pay | Admitting: Nurse Practitioner

## 2021-08-28 MED ORDER — WEGOVY 1.7 MG/0.75ML ~~LOC~~ SOAJ
1.7000 mg | SUBCUTANEOUS | 0 refills | Status: DC
  Filled 2021-08-28 – 2021-09-05 (×2): qty 3, 28d supply, fill #0

## 2021-08-29 ENCOUNTER — Encounter: Payer: Self-pay | Admitting: *Deleted

## 2021-09-02 ENCOUNTER — Other Ambulatory Visit (HOSPITAL_COMMUNITY): Payer: Self-pay

## 2021-09-02 ENCOUNTER — Encounter: Payer: Self-pay | Admitting: Nurse Practitioner

## 2021-09-02 ENCOUNTER — Telehealth: Payer: 59 | Admitting: Physician Assistant

## 2021-09-02 DIAGNOSIS — B9689 Other specified bacterial agents as the cause of diseases classified elsewhere: Secondary | ICD-10-CM | POA: Diagnosis not present

## 2021-09-02 DIAGNOSIS — J019 Acute sinusitis, unspecified: Secondary | ICD-10-CM | POA: Diagnosis not present

## 2021-09-02 MED ORDER — AMOXICILLIN-POT CLAVULANATE 875-125 MG PO TABS
1.0000 | ORAL_TABLET | Freq: Two times a day (BID) | ORAL | 0 refills | Status: DC
  Filled 2021-09-02: qty 14, 7d supply, fill #0

## 2021-09-03 ENCOUNTER — Other Ambulatory Visit: Payer: Self-pay | Admitting: Nurse Practitioner

## 2021-09-03 ENCOUNTER — Other Ambulatory Visit (HOSPITAL_COMMUNITY): Payer: Self-pay

## 2021-09-03 DIAGNOSIS — R112 Nausea with vomiting, unspecified: Secondary | ICD-10-CM

## 2021-09-03 MED ORDER — WEGOVY 1 MG/0.5ML ~~LOC~~ SOAJ
1.0000 mg | SUBCUTANEOUS | 0 refills | Status: DC
  Filled 2021-09-03: qty 2, 28d supply, fill #0

## 2021-09-03 MED ORDER — ONDANSETRON HCL 4 MG PO TABS
4.0000 mg | ORAL_TABLET | Freq: Three times a day (TID) | ORAL | 0 refills | Status: DC | PRN
  Filled 2021-09-03: qty 20, 7d supply, fill #0

## 2021-09-03 NOTE — Progress Notes (Unsigned)
Zofran

## 2021-09-03 NOTE — Telephone Encounter (Signed)
Please advise 

## 2021-09-05 ENCOUNTER — Other Ambulatory Visit (HOSPITAL_COMMUNITY): Payer: Self-pay

## 2021-09-06 ENCOUNTER — Other Ambulatory Visit (HOSPITAL_COMMUNITY): Payer: Self-pay | Admitting: Nurse Practitioner

## 2021-09-06 DIAGNOSIS — Z1231 Encounter for screening mammogram for malignant neoplasm of breast: Secondary | ICD-10-CM

## 2021-09-23 ENCOUNTER — Other Ambulatory Visit: Payer: Self-pay | Admitting: Nurse Practitioner

## 2021-09-23 ENCOUNTER — Other Ambulatory Visit (HOSPITAL_COMMUNITY): Payer: Self-pay

## 2021-09-23 ENCOUNTER — Ambulatory Visit (HOSPITAL_COMMUNITY)
Admission: RE | Admit: 2021-09-23 | Discharge: 2021-09-23 | Disposition: A | Discharge status: Home / Self Care | Payer: 59 | Source: Ambulatory Visit | Attending: Nurse Practitioner | Admitting: Nurse Practitioner

## 2021-09-23 DIAGNOSIS — Z1231 Encounter for screening mammogram for malignant neoplasm of breast: Secondary | ICD-10-CM | POA: Insufficient documentation

## 2021-09-23 MED ORDER — WEGOVY 1.7 MG/0.75ML ~~LOC~~ SOAJ
1.7000 mg | SUBCUTANEOUS | 0 refills | Status: AC
  Filled 2021-09-23: qty 3, 28d supply, fill #0

## 2021-09-24 NOTE — Progress Notes (Signed)
Normal test repeat in 1 year

## 2021-09-26 ENCOUNTER — Other Ambulatory Visit (HOSPITAL_COMMUNITY): Payer: Self-pay

## 2021-10-09 ENCOUNTER — Encounter: Payer: Self-pay | Admitting: Nurse Practitioner

## 2021-10-09 ENCOUNTER — Ambulatory Visit: Payer: 59 | Admitting: Nurse Practitioner

## 2021-10-09 ENCOUNTER — Encounter: Payer: 59 | Admitting: Radiology

## 2021-10-09 DIAGNOSIS — U071 COVID-19: Secondary | ICD-10-CM | POA: Diagnosis not present

## 2021-10-09 MED ORDER — BENZONATATE 100 MG PO CAPS: 100.0000 mg | ORAL_CAPSULE | Freq: Two times a day (BID) | ORAL | 0 refills | Status: DC | PRN

## 2021-10-09 MED ORDER — NIRMATRELVIR/RITONAVIR (PAXLOVID)TABLET: 3.0000 | ORAL_TABLET | Freq: Two times a day (BID) | ORAL | 0 refills | Status: AC

## 2021-10-09 NOTE — Assessment & Plan Note (Signed)
Tested positive for COVID today Start PaxlovidTake 3 tablets by mouth 2 (two) times daily for 5 days. (Take nirmatrelvir 150 mg two tablets twice daily for 5 days and ritonavir 100 mg one tablet twice daily for 5 days  Tessalon 100 mg twice daily as needed Patient encouraged to stay on isolation for 5 days Wear mask and proper hand hygiene encouraged to prevent spreading the virus to others. Take OTC Tylenol as needed for fever

## 2021-10-09 NOTE — Progress Notes (Signed)
Virtual Visit via Telephone Note  I connected with Monica Hernandez @ on 10/09/21 at 1245 by telephone and verified that I am speaking with the correct person using two identifiers.  I spent 8 minutes on this telephone encounter.   Location: Patient: home Provider: office   I discussed the limitations, risks, security and privacy concerns of performing an evaluation and management service by telephone and the availability of in person appointments. I also discussed with the patient that there may be a patient responsible charge related to this service. The patient expressed understanding and agreed to proceed.   History of Present Illness: Monica Hernandez with past medical history of migraine, asthma, morbid obesity prediabetes presents with complaints of itchy throat, dry cough, body aches, chills that started 1 day ago , she tested positive for covid today at home.  Patient denies wheezing,, bloody sputum, chest pain, fever.  She did receive COVID-vaccine but not the booster , she denies known COVID exposure.    Observations/Objective:   Assessment and Plan: COVID-19 virus infection Tested positive for COVID today Start PaxlovidTake 3 tablets by mouth 2 (two) times daily for 5 days. (Take nirmatrelvir 150 mg two tablets twice daily for 5 days and ritonavir 100 mg one tablet twice daily for 5 days  Tessalon 100 mg twice daily as needed Patient encouraged to stay on isolation for 5 days Wear mask and proper hand hygiene encouraged to prevent spreading the virus to others. Take OTC Tylenol as needed for fever   Follow Up Instructions:    I discussed the assessment and treatment plan with the patient. The patient was provided an opportunity to ask questions and all were answered. The patient agreed with the plan and demonstrated an understanding of the instructions.   The patient was advised to call back or seek an in-person evaluation if the symptoms worsen or if the condition fails to improve  as anticipated.

## 2021-10-24 ENCOUNTER — Other Ambulatory Visit: Payer: Self-pay | Admitting: Nurse Practitioner

## 2021-10-24 ENCOUNTER — Other Ambulatory Visit (HOSPITAL_COMMUNITY): Payer: Self-pay

## 2021-10-24 MED ORDER — WEGOVY 2.4 MG/0.75ML ~~LOC~~ SOAJ
2.4000 mg | SUBCUTANEOUS | 0 refills | Status: DC
  Filled 2021-10-24: qty 3, 28d supply, fill #0

## 2021-10-25 ENCOUNTER — Other Ambulatory Visit (HOSPITAL_COMMUNITY): Payer: Self-pay

## 2021-11-04 ENCOUNTER — Encounter: Payer: 59 | Admitting: Radiology

## 2021-11-15 ENCOUNTER — Ambulatory Visit: Payer: 59 | Admitting: Nurse Practitioner

## 2021-11-15 ENCOUNTER — Other Ambulatory Visit (HOSPITAL_COMMUNITY): Payer: Self-pay

## 2021-11-15 ENCOUNTER — Encounter: Payer: Self-pay | Admitting: Nurse Practitioner

## 2021-11-15 DIAGNOSIS — Z23 Encounter for immunization: Secondary | ICD-10-CM

## 2021-11-15 MED ORDER — WEGOVY 2.4 MG/0.75ML ~~LOC~~ SOAJ
2.4000 mg | SUBCUTANEOUS | 4 refills | Status: DC
  Filled 2021-11-15: qty 3, 28d supply, fill #0
  Filled 2021-12-15: qty 3, 28d supply, fill #1
  Filled 2022-01-10: qty 3, 28d supply, fill #2
  Filled 2022-02-13: qty 3, 28d supply, fill #3

## 2021-11-15 NOTE — Progress Notes (Signed)
Established Patient Office Visit  Subjective   Patient ID: Monica Hernandez, female    DOB: 12/02/1975  Age: 46 y.o. MRN: [id number]  Chief Complaint  Patient presents with   Weight Check    3 month f/u    [name] with past medical history of morbid obesity, arthritis, prediabetes presents for follow-up for obesity.  Patient's state that she is tolerating Wegovy well.  She denies any adverse reactions to current medications.  She has been doing keto diet and exercising.    She has not been able to get colonoscopy done due to her work schedule       Review of Systems  Constitutional: Negative.   Respiratory: Negative.    Cardiovascular: Negative.   Gastrointestinal: Negative.   Psychiatric/Behavioral: Negative.        Objective:     BP 120/84 (BP Location: Left Arm, Patient Position: Sitting, Cuff Size: Large)   Pulse 73   Ht 5\' 4"  (1.626 m)   Wt 219 lb (99.3 kg)   SpO2 95%   BMI 37.59 kg/m    Physical Exam Constitutional:      General: She is not in acute distress.    Appearance: She is obese. She is not ill-appearing, toxic-appearing or diaphoretic.  Cardiovascular:     Rate and Rhythm: Normal rate and regular rhythm.     Pulses: Normal pulses.     Heart sounds: No murmur heard.    No friction rub. No gallop.  Pulmonary:     Effort: Pulmonary effort is normal. No respiratory distress.     Breath sounds: Normal breath sounds. No stridor. No wheezing, rhonchi or rales.  Chest:     Chest wall: No tenderness.  Abdominal:     Palpations: Abdomen is soft.  Musculoskeletal:        General: No swelling, tenderness, deformity or signs of injury.     Right lower leg: No edema.     Left lower leg: No edema.  Skin:    General: Skin is warm and dry.  Neurological:     Mental Status: She is alert and oriented to person, place, and time.  Psychiatric:        Mood and Affect: Mood normal.        Behavior: Behavior normal.        Thought Content: Thought  content normal.        Judgment: Judgment normal.      No results found for any visits on 11/15/21.    The 10-year ASCVD risk score (Arnett DK, et al., 2019) is: 0.6%    Assessment & Plan:   Problem List Items Addressed This Visit       Other   Morbid obesity (HCC) - Primary    Wt Readings from Last 3 Encounters:  11/15/21 219 lb (99.3 kg)  08/23/21 232 lb (105.2 kg)  08/16/21 233 lb (105.7 kg)  she has been doing keto diet , walking , doing the ellpitical at the [facility].  She has lost 13 pounds since last visit Current BMI 37.59 Wegovy 2.4 mg once weekly injection refilled Patient congratulated on her efforts Counseled on low-carb diet encouraged to engage in regular moderate exercises at least 150 minutes weekly Follow-up in 3 months      Relevant Medications   Semaglutide-Weight Management (WEGOVY) 2.4 MG/0.75ML SOAJ   Need for influenza vaccination    Patient educated on CDC recommendation for the vaccine. Verbal consent was obtained from the patient,  vaccine administered by nurse, no sign of adverse reactions noted at this time. Patient education on arm soreness and use of tylenol  this patient  was discussed. Patient educated on the signs and symptoms of adverse effect and advise to contact the office if they occur.        Other Visit Diagnoses     Immunization due       Relevant Orders   Flu Vaccine QUAD 6+ mos PF IM (Fluarix Quad PF)       Return in about 3 months (around 02/14/2022).    Donell Beers, FNP

## 2021-11-15 NOTE — Patient Instructions (Signed)

## 2021-11-15 NOTE — Assessment & Plan Note (Signed)
Patient educated on CDC recommendation for the vaccine. Verbal consent was obtained from the patient, vaccine administered by nurse, no sign of adverse reactions noted at this time. Patient education on arm soreness and use of tylenol  this patient  was discussed. Patient educated on the signs and symptoms of adverse effect and advise to contact the office if they occur.

## 2021-11-15 NOTE — Assessment & Plan Note (Addendum)
Wt Readings from Last 3 Encounters:  11/15/21 219 lb (99.3 kg)  08/23/21 232 lb (105.2 kg)  08/16/21 233 lb (105.7 kg)  she has been doing keto diet , walking , doing the ellpitical at the [facility].  She has lost 13 pounds since last visit Current BMI 37.59 Wegovy 2.4 mg once weekly injection refilled Patient congratulated on her efforts Counseled on low-carb diet encouraged to engage in regular moderate exercises at least 150 minutes weekly Follow-up in 3 months

## 2021-12-16 ENCOUNTER — Other Ambulatory Visit (HOSPITAL_COMMUNITY): Payer: Self-pay

## 2022-01-10 ENCOUNTER — Other Ambulatory Visit (HOSPITAL_COMMUNITY): Payer: Self-pay

## 2022-02-07 ENCOUNTER — Telehealth: Payer: 59 | Admitting: Family Medicine

## 2022-02-07 DIAGNOSIS — J019 Acute sinusitis, unspecified: Secondary | ICD-10-CM | POA: Diagnosis not present

## 2022-02-07 DIAGNOSIS — B9689 Other specified bacterial agents as the cause of diseases classified elsewhere: Secondary | ICD-10-CM

## 2022-02-07 MED ORDER — AMOXICILLIN-POT CLAVULANATE 875-125 MG PO TABS: 1.0000 | ORAL_TABLET | Freq: Two times a day (BID) | ORAL | 0 refills | Status: DC

## 2022-02-07 NOTE — Progress Notes (Signed)

## 2022-02-14 ENCOUNTER — Encounter: Payer: Self-pay | Admitting: Family Medicine

## 2022-02-14 ENCOUNTER — Other Ambulatory Visit (HOSPITAL_COMMUNITY): Payer: Self-pay

## 2022-02-14 ENCOUNTER — Ambulatory Visit: Payer: 59 | Admitting: Family Medicine

## 2022-02-14 DIAGNOSIS — R7301 Impaired fasting glucose: Secondary | ICD-10-CM

## 2022-02-14 DIAGNOSIS — E7849 Other hyperlipidemia: Secondary | ICD-10-CM

## 2022-02-14 DIAGNOSIS — B351 Tinea unguium: Secondary | ICD-10-CM | POA: Diagnosis not present

## 2022-02-14 DIAGNOSIS — E559 Vitamin D deficiency, unspecified: Secondary | ICD-10-CM | POA: Diagnosis not present

## 2022-02-14 DIAGNOSIS — E038 Other specified hypothyroidism: Secondary | ICD-10-CM | POA: Diagnosis not present

## 2022-02-14 MED ORDER — TERBINAFINE HCL 1 % EX CREA
1.0000 | TOPICAL_CREAM | Freq: Two times a day (BID) | CUTANEOUS | 0 refills | Status: DC
  Filled 2022-02-14 – 2022-03-12 (×3): qty 30, 15d supply, fill #0

## 2022-02-14 MED ORDER — WEGOVY 2.4 MG/0.75ML ~~LOC~~ SOAJ
2.4000 mg | SUBCUTANEOUS | 4 refills | Status: DC
  Filled 2022-02-14 – 2022-03-11 (×2): qty 3, 28d supply, fill #0
  Filled 2022-04-06: qty 3, 28d supply, fill #1
  Filled 2022-05-05: qty 3, 28d supply, fill #2
  Filled 2022-05-26: qty 3, 28d supply, fill #3
  Filled 2022-06-30: qty 3, 28d supply, fill #4

## 2022-02-14 NOTE — Patient Instructions (Signed)
I appreciate the opportunity to provide care to you today!    Follow up:  3 months  Labs: please stop by the lab during the week to get your blood drawn (CBC, CMP, TSH, Lipid profile, HgA1c, Vit D)  Please pick up your medication at the pharmacy I sent topical cream for your toenail fungus Treatment duration is about 12 weeks    Please continue to a heart-healthy diet and increase your physical activities. Try to exercise for at least three times a week.      It was a pleasure to see you and I look forward to continuing to work together on your health and well-being. Please do not hesitate to call the office if you need care or have questions about your care.   Have a wonderful day and week. With Gratitude, Gilmore Laroche MSN, FNP-BC

## 2022-02-14 NOTE — Progress Notes (Signed)
Established Patient Office Visit  Subjective:  Patient ID: Monica Hernandez, female    DOB: December 09, 1975  Age: 46 y.o. MRN: [id number]  CC:  Chief Complaint  Patient presents with   Follow-up    Following up for weight loss, lost 12 lbs since last visit.     HPI [name] is a 46 y.o. female with past medical history of obesity and onychomycosis for f/u of  chronic medical conditions.  Morbid Obesity: She takes Wegovy 2.4 mg subcu injection weekly.  She reports adherence to treatment regimen and has lost 12 pounds since last seen on 11/16/2018.  Onychomycosis: She complains of toenail fungus on her right second toe.  She reports applying topical treatments over-the-counter with minimal relief of her symptoms.  Past Medical History:  Diagnosis Date   Allergy    Anemia    Asthma    Eczema    GERD (gastroesophageal reflux disease)    History of kidney stones    Hypertension    Kidney stone    Mitral valve prolapse    was told as a kid that she had this but during ECHO for eval, ECHO did not indicate this , see 08-25-16 epic result    PONV (postoperative nausea and vomiting)    epidural with c section caused N/V    Pre-diabetes    Recurrent upper respiratory infection (URI)    Ulcer     Past Surgical History:  Procedure Laterality Date   ABDOMINAL HYSTERECTOMY     total   ADENOIDECTOMY     ADENOIDECTOMY, TONSILLECTOMY AND MYRINGOTOMY WITH TUBE PLACEMENT     CESAREAN SECTION     FRACTURE SURGERY     GASTRIC ROUX-EN-Y N/A 03/31/2017   Procedure: LAPAROSCOPIC ROUX-EN-Y GASTRIC BYPASS WITH UPPER ENDOSCOPY;  Surgeon: Kinsinger, Arta Bruce, MD;  Location: [facility];  Service: General;  Laterality: N/A;   HERNIA REPAIR     umbilical   LEG SURGERY Left    MULTIPLE TOOTH EXTRACTIONS Bilateral 2023   TONSILLECTOMY     TUBAL LIGATION      Family History  Problem Relation Age of Onset   Hypertension Mother    Stroke Mother    Thyroid cancer Mother    Stroke Father     Lung cancer Father    Brain cancer Father    Bone cancer Father    Hypertension Sister    Thyroid cancer Sister    Hypertension Brother    Heart disease Maternal Grandmother    Diabetes Maternal Grandmother    Asthma Other    Cancer Other    Stomach cancer Neg Hx    Colon cancer Neg Hx     Social History   Socioeconomic History   Marital status: Married    Spouse name: Not on file   Number of children: 1   Years of education: Not on file   Highest education level: Not on file  Occupational History    Employer: [employer]    Comment: heart care    Comment: [employer]  Tobacco Use   Smoking status: Former    Packs/day: 0.50    Years: 15.00    Total pack years: 7.50    Types: Cigarettes    Quit date: 2010    Years since quitting: 13.9   Smokeless tobacco: Never   Tobacco comments:    smoked since age 57 ; quit in 2010   Vaping Use   Vaping Use: Never used  Substance  and Sexual Activity   Alcohol use: No   Drug use: No   Sexual activity: Yes    Partners: Male    Birth control/protection: Surgical  Other Topics Concern   Not on file  Social History Narrative   Live with her spouse ans her son   Social Determinants of Health   Financial Resource Strain: Not on file  Food Insecurity: Not on file  Transportation Needs: Not on file  Physical Activity: Not on file  Stress: Not on file  Social Connections: Not on file  Intimate Partner Violence: Not on file    Outpatient Medications Prior to Visit  Medication Sig Dispense Refill   acetaminophen (TYLENOL) 500 MG tablet Take 1,000 mg by mouth every 6 (six) hours as needed for mild pain or moderate pain.     albuterol (VENTOLIN HFA) 108 (90 Base) MCG/ACT inhaler Inhale 2 puffs into the lungs every 6 (six) hours as needed for wheezing or shortness of breath. 8 g 0   amoxicillin-clavulanate (AUGMENTIN) 875-125 MG tablet Take 1 tablet by mouth 2 (two) times daily. 20 tablet 0   Cholecalciferol (VITAMIN D3) 25 MCG  (1000 UT) CAPS Take 1 capsule (1,000 Units total) by mouth daily. 60 capsule 3   EPINEPHrine 0.3 mg/0.3 mL IJ SOAJ injection Inject 0.3 mg into the muscle as needed for anaphylaxis. 2 each 1   famotidine (PEPCID) 40 MG tablet Take 1 tablet (40 mg total) by mouth 2 (two) times daily. 60 tablet 3   furosemide (LASIX) 20 MG tablet Take 20 mg daily as needed for swelling, weight gain 90 tablet 3   meclizine (ANTIVERT) 25 MG tablet Take 1 tablet (25 mg total) by mouth 3 (three) times daily as needed for dizziness. 30 tablet 0   metoprolol succinate (TOPROL-XL) 25 MG 24 hr tablet Take 1 tablet (25 mg total) by mouth daily. 90 tablet 3   Multiple Vitamins-Minerals (BARIATRIC MULTIVITAMINS/IRON PO) Take 2 tablets by mouth 2 (two) times daily. Chewable tablets     ondansetron (ZOFRAN) 4 MG tablet Take 1 tablet (4 mg total) by mouth every 8 (eight) hours as needed for nausea or vomiting. 20 tablet 0   Semaglutide-Weight Management (WEGOVY) 2.4 MG/0.75ML SOAJ Inject 2.4 mg into the skin once a week. 3 mL 4   No facility-administered medications prior to visit.    Allergies  Allergen Reactions   Bee Venom Anaphylaxis   Contrave [Naltrexone-Bupropion Hcl Er] Nausea And Vomiting    Pt states it causes bloody stool, bloody vomit, and heart palpitations   Erythromycin Nausea And Vomiting and Rash    Wheezing    Shellfish Allergy Anaphylaxis   Zithromax [Azithromycin] Nausea And Vomiting, Rash and Other (See Comments)   Belviq [Lorcaserin Hcl] Other (See Comments)    Causes changes in vision and respiratory issues   Liraglutide -Weight Management Other (See Comments)    Causes fainting (per pt, dose was toohigh and decreased gluc too much)   Qsymia [Phentermine-Topiramate] Other (See Comments)    Causes change in vision and respiratory issues    Allegra-D [Fexofenadine-Pseudoephed Er] Nausea And Vomiting   Levocetirizine Other (See Comments)    headache   Methylprednisolone Other (See Comments)    Low  heart rate   Naltrexone Other (See Comments)    Bloody stools.    Fish Allergy Rash   Sulfa Antibiotics Rash    ROS Review of Systems  Constitutional:  Negative for chills, fatigue and fever.  Eyes:  Negative for visual  disturbance.  Respiratory:  Negative for chest tightness and shortness of breath.   Musculoskeletal:        Toenail fungus on the right second toe  Neurological:  Negative for dizziness and headaches.      Objective:    Physical Exam HENT:     Head: Normocephalic.     Mouth/Throat:     Mouth: Mucous membranes are moist.  Cardiovascular:     Rate and Rhythm: Normal rate.     Heart sounds: Normal heart sounds.  Pulmonary:     Effort: Pulmonary effort is normal.     Breath sounds: Normal breath sounds.  Musculoskeletal:     Comments: Thickened yellow discoloration of the right second toenail  Neurological:     Mental Status: She is alert.     BP 127/87   Pulse 78   Ht _0  (1.626 m)   Wt 207 lb 1.9 oz (93.9 kg)   SpO2 98%   BMI 35.55 kg/m  Wt Readings from Last 3 Encounters:  02/14/22 207 lb 1.9 oz (93.9 kg)  11/15/21 219 lb (99.3 kg)  08/23/21 232 lb (105.2 kg)    Lab Results  Component Value Date   TSH 2.720 08/16/2021   Lab Results  Component Value Date   WBC 7.1 08/16/2021   HGB 12.6 08/16/2021   HCT 38.3 08/16/2021   MCV 87 08/16/2021   PLT 253 08/16/2021   Lab Results  Component Value Date   NA 139 08/16/2021   K 4.6 08/16/2021   CO2 26 08/16/2021   GLUCOSE 95 08/16/2021   BUN 11 08/16/2021   CREATININE 0.81 08/16/2021   BILITOT <0.2 08/16/2021   ALKPHOS 93 08/16/2021   AST 20 08/16/2021   ALT 12 08/16/2021   PROT 7.3 08/16/2021   ALBUMIN 4.5 08/16/2021   CALCIUM 9.7 08/16/2021   ANIONGAP 7 08/02/2020   EGFR 90 08/16/2021   Lab Results  Component Value Date   CHOL 165 04/10/2021   Lab Results  Component Value Date   HDL 61 04/10/2021   Lab Results  Component Value Date   LDLCALC 87 04/10/2021   Lab  Results  Component Value Date   TRIG 80 04/10/2021   Lab Results  Component Value Date   CHOLHDL 2.7 04/10/2021   Lab Results  Component Value Date   HGBA1C 5.6 04/10/2021      Assessment & Plan:  Morbid obesity [facility]) Assessment & Plan: Wegovy 2.4 mg once weekly subcu injection refilled Wt Readings from Last 3 Encounters:  02/14/22 207 lb 1.9 oz (93.9 kg)  11/15/21 219 lb (99.3 kg)  08/23/21 232 lb (105.2 kg)     Orders: -     QIHKVQ; Inject 2.4 mg into the skin once a week.  Dispense: 3 mL; Refill: 4  Onychomycosis of toenail Assessment & Plan: Will treat with a trial of topical lamsil for 12 weeks    Onychomycosis -     Terbinafine HCl; Apply 1 Application topically 2 (two) times daily.  Dispense: 30 g; Refill: 0  Vitamin D deficiency -     VITAMIN D 25 Hydroxy (Vit-D Deficiency, Fractures)  Other specified hypothyroidism -     TSH + free T4  IFG (impaired fasting glucose) -     Hemoglobin A1c  Other hyperlipidemia -     Lipid panel -     CMP14+EGFR -     CBC with Differential/Platelet    Follow-up: Return in about 3 months (around 05/16/2022).  Alvira Monday, FNP

## 2022-02-15 DIAGNOSIS — B351 Tinea unguium: Secondary | ICD-10-CM | POA: Insufficient documentation

## 2022-02-15 DIAGNOSIS — H5213 Myopia, bilateral: Secondary | ICD-10-CM | POA: Diagnosis not present

## 2022-02-15 NOTE — Assessment & Plan Note (Signed)
Wegovy 2.4 mg once weekly subcu injection refilled Wt Readings from Last 3 Encounters:  02/14/22 207 lb 1.9 oz (93.9 kg)  11/15/21 219 lb (99.3 kg)  08/23/21 232 lb (105.2 kg)

## 2022-02-15 NOTE — Assessment & Plan Note (Signed)
Will treat with a trial of topical lamsil for 12 weeks

## 2022-02-20 DIAGNOSIS — E559 Vitamin D deficiency, unspecified: Secondary | ICD-10-CM | POA: Diagnosis not present

## 2022-02-20 DIAGNOSIS — R7301 Impaired fasting glucose: Secondary | ICD-10-CM | POA: Diagnosis not present

## 2022-02-20 DIAGNOSIS — E038 Other specified hypothyroidism: Secondary | ICD-10-CM | POA: Diagnosis not present

## 2022-02-20 DIAGNOSIS — E7849 Other hyperlipidemia: Secondary | ICD-10-CM | POA: Diagnosis not present

## 2022-02-21 LAB — LIPID PANEL
Chol/HDL Ratio: 2.7 ratio (ref 0.0–4.4)
Cholesterol, Total: 169 mg/dL (ref 100–199)
HDL: 63 mg/dL (ref >39)
LDL Chol Calc (NIH): 95 mg/dL (ref 0–99)
Triglycerides: 54 mg/dL (ref 0–149)
VLDL Cholesterol Cal: 11 mg/dL (ref 5–40)

## 2022-02-21 LAB — HEMOGLOBIN A1C
Est. average glucose Bld gHb Est-mCnc: 117 mg/dL
Hgb A1c MFr Bld: 5.7 % — ABNORMAL HIGH (ref 4.8–5.6)

## 2022-02-21 LAB — CBC WITH DIFFERENTIAL/PLATELET
Basophils Absolute: 0 10*3/uL (ref 0.0–0.2)
Basos: 1 %
EOS (ABSOLUTE): 0.1 10*3/uL (ref 0.0–0.4)
Eos: 2 %
Hematocrit: 37.6 % (ref 34.0–46.6)
Hemoglobin: 12.3 g/dL (ref 11.1–15.9)
Immature Grans (Abs): 0 10*3/uL (ref 0.0–0.1)
Immature Granulocytes: 0 %
Lymphocytes Absolute: 2.5 10*3/uL (ref 0.7–3.1)
Lymphs: 41 %
MCH: 29.1 pg (ref 26.6–33.0)
MCHC: 32.7 g/dL (ref 31.5–35.7)
MCV: 89 fL (ref 79–97)
Monocytes Absolute: 0.3 10*3/uL (ref 0.1–0.9)
Monocytes: 5 %
Neutrophils Absolute: 3.2 10*3/uL (ref 1.4–7.0)
Neutrophils: 51 %
Platelets: 226 10*3/uL (ref 150–450)
RBC: 4.22 x10E6/uL (ref 3.77–5.28)
RDW: 12.2 % (ref 11.7–15.4)
WBC: 6.2 10*3/uL (ref 3.4–10.8)

## 2022-02-21 LAB — CMP14+EGFR
ALT: 12 IU/L (ref 0–32)
AST: 19 IU/L (ref 0–40)
Albumin/Globulin Ratio: 2 (ref 1.2–2.2)
Albumin: 4.3 g/dL (ref 3.9–4.9)
Alkaline Phosphatase: 86 IU/L (ref 44–121)
BUN/Creatinine Ratio: 13 (ref 9–23)
BUN: 11 mg/dL (ref 6–24)
Bilirubin Total: 0.3 mg/dL (ref 0.0–1.2)
CO2: 24 mmol/L (ref 20–29)
Calcium: 9.5 mg/dL (ref 8.7–10.2)
Chloride: 102 mmol/L (ref 96–106)
Creatinine, Ser: 0.84 mg/dL (ref 0.57–1.00)
Globulin, Total: 2.2 g/dL (ref 1.5–4.5)
Glucose: 82 mg/dL (ref 70–99)
Potassium: 4.6 mmol/L (ref 3.5–5.2)
Sodium: 139 mmol/L (ref 134–144)
Total Protein: 6.5 g/dL (ref 6.0–8.5)
eGFR: 86 mL/min/{1.73_m2} (ref >59)

## 2022-02-21 LAB — TSH+FREE T4
Free T4: 1.07 ng/dL (ref 0.82–1.77)
TSH: 2.26 u[IU]/mL (ref 0.450–4.500)

## 2022-02-21 LAB — VITAMIN D 25 HYDROXY (VIT D DEFICIENCY, FRACTURES): Vit D, 25-Hydroxy: 36.9 ng/mL (ref 30.0–100.0)

## 2022-02-28 NOTE — Progress Notes (Signed)
I have provided 5 minutes of non face to face time during this encounter for chart review and documentation.   

## 2022-03-12 ENCOUNTER — Other Ambulatory Visit (HOSPITAL_COMMUNITY): Payer: Self-pay

## 2022-03-13 ENCOUNTER — Other Ambulatory Visit: Payer: Self-pay

## 2022-03-14 ENCOUNTER — Other Ambulatory Visit (HOSPITAL_COMMUNITY): Payer: Self-pay

## 2022-04-21 ENCOUNTER — Telehealth: Payer: Commercial Managed Care - PPO | Admitting: Nurse Practitioner

## 2022-04-21 DIAGNOSIS — J069 Acute upper respiratory infection, unspecified: Secondary | ICD-10-CM

## 2022-04-21 NOTE — Progress Notes (Signed)
E-Visit for Sore Throat  We are sorry that you are not feeling well.  Here is how we plan to help!  Your symptoms indicate a likely viral infection (Pharyngitis).   Pharyngitis is inflammation in the back of the throat which can cause a sore throat, scratchiness and sometimes difficulty swallowing.     Pharyngitis is typically caused by a respiratory virus and will just run its course.  Please keep in mind that your symptoms could last up to 10 days.  It is best to treat the underlying symptoms of congestion and cough that are likely worsening the sore throat.   For throat pain, we recommend over the counter oral pain relief medications.  Topical treatments such as oral throat lozenges or sprays may be used as needed.  Avoid close contact with loved ones, especially the very young and elderly.    Remember to wash your hands thoroughly throughout the day as this is the number one way to prevent the spread of infection and wipe down door knobs and counters with disinfectant.  Providers prescribe antibiotics to treat infections caused by bacteria. Antibiotics are very powerful in treating bacterial infections when they are used properly. To maintain their effectiveness, they should be used only when necessary. Overuse of antibiotics has resulted in the development of superbugs that are resistant to treatment!    After careful review of your answers, I would not recommend an antibiotic for your condition.  Antibiotics are not effective against viruses and therefore should not be used to treat them. Common examples of infections caused by viruses include colds and flu   After careful review of your answers, I would not recommend an antibiotic for your condition.  Antibiotics should not be used to treat conditions that we suspect are caused by viruses like the virus that causes the common cold or flu. However, some people can have Strep with atypical symptoms. You may need formal testing in clinic or  office to confirm if your symptoms continue or worsen.  Providers prescribe antibiotics to treat infections caused by bacteria. Antibiotics are very powerful in treating bacterial infections when they are used properly.  To maintain their effectiveness, they should be used only when necessary.  Overuse of antibiotics has resulted in the development of super bugs that are resistant to treatment!    Home Care: Only take medications as instructed by your medical team. Do not drink alcohol while taking these medications. A steam or ultrasonic humidifier can help congestion.  You can place a towel over your head and breathe in the steam from hot water coming from a faucet. Avoid close contacts especially the very young and the elderly. Cover your mouth when you cough or sneeze. Always remember to wash your hands.  Get Help Right Away If: You develop worsening fever or throat pain. You develop a severe head ache or visual changes. Your symptoms persist after you have completed your treatment plan.  Make sure you Understand these instructions. Will watch your condition. Will get help right away if you are not doing well or get worse.   Thank you for choosing an e-visit.  Your e-visit answers were reviewed by a board certified advanced clinical practitioner to complete your personal care plan. Depending upon the condition, your plan could have included both over the counter or prescription medications.  Please review your pharmacy choice. Make sure the pharmacy is open so you can pick up prescription now. If there is a problem, you may contact your  provider through [employer] and have the prescription routed to another pharmacy.  Your safety is important to [place]. If you have drug allergies check your prescription carefully.   For the next 24 hours you can use MyChart to ask questions about today's visit, request a non-urgent call back, or ask for a work or school excuse. You will get an  email in the next two days asking about your experience. I hope that your e-visit has been valuable and will speed your recovery.

## 2022-05-16 ENCOUNTER — Ambulatory Visit: Payer: Self-pay | Admitting: Family Medicine

## 2022-05-19 ENCOUNTER — Ambulatory Visit: Payer: Commercial Managed Care - PPO | Admitting: Family Medicine

## 2022-05-19 ENCOUNTER — Encounter: Payer: Self-pay | Admitting: Family Medicine

## 2022-05-19 VITALS — BP 110/76 | HR 84 | Ht 64.0 in | Wt 198.1 lb

## 2022-05-19 DIAGNOSIS — E559 Vitamin D deficiency, unspecified: Secondary | ICD-10-CM

## 2022-05-19 DIAGNOSIS — Z1211 Encounter for screening for malignant neoplasm of colon: Secondary | ICD-10-CM

## 2022-05-19 DIAGNOSIS — E038 Other specified hypothyroidism: Secondary | ICD-10-CM | POA: Diagnosis not present

## 2022-05-19 DIAGNOSIS — E7849 Other hyperlipidemia: Secondary | ICD-10-CM

## 2022-05-19 DIAGNOSIS — R7301 Impaired fasting glucose: Secondary | ICD-10-CM | POA: Diagnosis not present

## 2022-05-19 DIAGNOSIS — D229 Melanocytic nevi, unspecified: Secondary | ICD-10-CM | POA: Insufficient documentation

## 2022-05-19 NOTE — Progress Notes (Signed)
Established Patient Office Visit  Subjective:  Patient ID: Monica Hernandez, female    DOB: 1976/01/01  Age: 47 y.o. MRN: [id number]  CC:  Chief Complaint  Patient presents with   Skin Problem    Pt reports mole like skin problem but are spreading around her torso area first noticed them a month and half ago, 04/07/22   Obesity    Would like to discuss wegovy, states insurance will no longer cover it.     HPI Monica Hernandez is a 47 y.o. female with past medical history of migraines, morbid obesity, palpitations, and prediabetes presents for f/u of  chronic medical conditions. For the details of today's visit, please refer to the assessment and plan.        Past Medical History:  Diagnosis Date   Allergy    Anemia    Asthma    Eczema    GERD (gastroesophageal reflux disease)    History of kidney stones    Hypertension    Kidney stone    Mitral valve prolapse    was told as a kid that she had this but during ECHO for eval, ECHO did not indicate this , see 08-25-16 epic result    PONV (postoperative nausea and vomiting)    epidural with c section caused N/V    Pre-diabetes    Recurrent upper respiratory infection (URI)    Ulcer     Past Surgical History:  Procedure Laterality Date   ABDOMINAL HYSTERECTOMY     total   ADENOIDECTOMY     ADENOIDECTOMY, TONSILLECTOMY AND MYRINGOTOMY WITH TUBE PLACEMENT     CESAREAN SECTION     FRACTURE SURGERY     GASTRIC ROUX-EN-Y N/A 03/31/2017   Procedure: LAPAROSCOPIC ROUX-EN-Y GASTRIC BYPASS WITH UPPER ENDOSCOPY;  Surgeon: Kinsinger, Arta Bruce, MD;  Location: [facility];  Service: General;  Laterality: N/A;   HERNIA REPAIR     umbilical   LEG SURGERY Left    MULTIPLE TOOTH EXTRACTIONS Bilateral 2023   TONSILLECTOMY     TUBAL LIGATION      Family History  Problem Relation Age of Onset   Hypertension Mother    Stroke Mother    Thyroid cancer Mother    Stroke Father    Lung cancer Father    Brain cancer Father    Bone  cancer Father    Hypertension Sister    Thyroid cancer Sister    Hypertension Brother    Heart disease Maternal Grandmother    Diabetes Maternal Grandmother    Asthma Other    Cancer Other    Stomach cancer Neg Hx    Colon cancer Neg Hx     Social History   Socioeconomic History   Marital status: Married    Spouse name: Not on file   Number of children: 1   Years of education: Not on file   Highest education level: Not on file  Occupational History    Employer: [employer]    Comment: heart care    Comment: [employer]  Tobacco Use   Smoking status: Former    Packs/day: 0.50    Years: 15.00    Total pack years: 7.50    Types: Cigarettes    Quit date: 2010    Years since quitting: 14.2   Smokeless tobacco: Never   Tobacco comments:    smoked since age 35 ; quit in 2010   Vaping Use   Vaping Use: Never used  Substance  and Sexual Activity   Alcohol use: No   Drug use: No   Sexual activity: Yes    Partners: Male    Birth control/protection: Surgical  Other Topics Concern   Not on file  Social History Narrative   Live with her spouse ans her son   Social Determinants of Health   Financial Resource Strain: Not on file  Food Insecurity: Not on file  Transportation Needs: Not on file  Physical Activity: Not on file  Stress: Not on file  Social Connections: Not on file  Intimate Partner Violence: Not on file    Outpatient Medications Prior to Visit  Medication Sig Dispense Refill   acetaminophen (TYLENOL) 500 MG tablet Take 1,000 mg by mouth every 6 (six) hours as needed for mild pain or moderate pain.     albuterol (VENTOLIN HFA) 108 (90 Base) MCG/ACT inhaler Inhale 2 puffs into the lungs every 6 (six) hours as needed for wheezing or shortness of breath. 8 g 0   amoxicillin-clavulanate (AUGMENTIN) 875-125 MG tablet Take 1 tablet by mouth 2 (two) times daily. 20 tablet 0   Cholecalciferol (VITAMIN D3) 25 MCG (1000 UT) CAPS Take 1 capsule (1,000 Units total) by  mouth daily. 60 capsule 3   EPINEPHrine 0.3 mg/0.3 mL IJ SOAJ injection Inject 0.3 mg into the muscle as needed for anaphylaxis. 2 each 1   famotidine (PEPCID) 40 MG tablet Take 1 tablet (40 mg total) by mouth 2 (two) times daily. 60 tablet 3   furosemide (LASIX) 20 MG tablet Take 20 mg daily as needed for swelling, weight gain 90 tablet 3   meclizine (ANTIVERT) 25 MG tablet Take 1 tablet (25 mg total) by mouth 3 (three) times daily as needed for dizziness. 30 tablet 0   metoprolol succinate (TOPROL-XL) 25 MG 24 hr tablet Take 1 tablet (25 mg total) by mouth daily. 90 tablet 3   Multiple Vitamins-Minerals (BARIATRIC MULTIVITAMINS/IRON PO) Take 2 tablets by mouth 2 (two) times daily. Chewable tablets     ondansetron (ZOFRAN) 4 MG tablet Take 1 tablet (4 mg total) by mouth every 8 (eight) hours as needed for nausea or vomiting. 20 tablet 0   Semaglutide-Weight Management (WEGOVY) 2.4 MG/0.75ML SOAJ Inject 2.4 mg into the skin once a week. 3 mL 4   terbinafine (LAMISIL AT) 1 % cream Apply 1 Application topically 2 (two) times daily. 30 g 0   No facility-administered medications prior to visit.    Allergies  Allergen Reactions   Bee Venom Anaphylaxis   Contrave [Naltrexone-Bupropion Hcl Er] Nausea And Vomiting    Pt states it causes bloody stool, bloody vomit, and heart palpitations   Erythromycin Nausea And Vomiting and Rash    Wheezing    Shellfish Allergy Anaphylaxis   Zithromax [Azithromycin] Nausea And Vomiting, Rash and Other (See Comments)   Belviq [Lorcaserin Hcl] Other (See Comments)    Causes changes in vision and respiratory issues   Fexofenadine-Pseudoephed Er Nausea And Vomiting   Liraglutide -Weight Management Other (See Comments)    Causes fainting (per pt, dose was toohigh and decreased gluc too much)   Qsymia [Phentermine-Topiramate] Other (See Comments)    Causes change in vision and respiratory issues    Levocetirizine Other (See Comments)    headache    Methylprednisolone Other (See Comments)    Low heart rate   Naltrexone Other (See Comments)    Bloody stools.    Fish Allergy Rash   Sulfa Antibiotics Rash    ROS  Review of Systems  Constitutional:  Negative for chills and fever.  Eyes:  Negative for visual disturbance.  Respiratory:  Negative for chest tightness and shortness of breath.   Skin:  Positive for rash.  Neurological:  Negative for dizziness and headaches.      Objective:    Physical Exam HENT:     Head: Normocephalic.     Mouth/Throat:     Mouth: Mucous membranes are moist.  Cardiovascular:     Rate and Rhythm: Normal rate.     Heart sounds: Normal heart sounds.  Pulmonary:     Effort: Pulmonary effort is normal.     Breath sounds: Normal breath sounds.  Skin:    Findings: Lesion (generalized  brown  macules round symmetric  on lower abdomen and left arm) present.  Neurological:     Mental Status: She is alert.     BP 110/76   Pulse 84   Ht '5\' 4"'$  (1.626 m)   Wt 198 lb 1.9 oz (89.9 kg)   SpO2 98%   BMI 34.01 kg/m  Wt Readings from Last 3 Encounters:  05/19/22 198 lb 1.9 oz (89.9 kg)  02/14/22 207 lb 1.9 oz (93.9 kg)  11/15/21 219 lb (99.3 kg)    Lab Results  Component Value Date   TSH 2.260 02/20/2022   Lab Results  Component Value Date   WBC 6.2 02/20/2022   HGB 12.3 02/20/2022   HCT 37.6 02/20/2022   MCV 89 02/20/2022   PLT 226 02/20/2022   Lab Results  Component Value Date   NA 139 02/20/2022   K 4.6 02/20/2022   CO2 24 02/20/2022   GLUCOSE 82 02/20/2022   BUN 11 02/20/2022   CREATININE 0.84 02/20/2022   BILITOT 0.3 02/20/2022   ALKPHOS 86 02/20/2022   AST 19 02/20/2022   ALT 12 02/20/2022   PROT 6.5 02/20/2022   ALBUMIN 4.3 02/20/2022   CALCIUM 9.5 02/20/2022   ANIONGAP 7 08/02/2020   EGFR 86 02/20/2022   Lab Results  Component Value Date   CHOL 169 02/20/2022   Lab Results  Component Value Date   HDL 63 02/20/2022   Lab Results  Component Value Date   LDLCALC  95 02/20/2022   Lab Results  Component Value Date   TRIG 54 02/20/2022   Lab Results  Component Value Date   CHOLHDL 2.7 02/20/2022   Lab Results  Component Value Date   HGBA1C 5.7 (H) 02/20/2022      Assessment & Plan:  Colon cancer screening -     Cologuard  Multiple nevi Assessment & Plan: Onset of symptoms a month ago She reports that the lesion appears light brown at first and transition to a brownish color with increased pruritus She notes clear drainage from the lesion Unsure if her skin lesion is cancerous Will place a referral to dermatology for further evaluation Encouraged to continue using cortisone cream topically to relieve symptoms of pruritus  Orders: -     Ambulatory referral to Dermatology  Morbid obesity [facility]) Assessment & Plan: History of gastric bypass in 2019 She was on Wegovy 2.4 mg once weekly She reports that her insurance no longer covers Wegovy and would like to be started on oral antiobesity medication Patient has allergies to phentermine Referral placed to PREP program Encourage heart healthy diet Wt Readings from Last 3 Encounters:  05/19/22 198 lb 1.9 oz (89.9 kg)  02/14/22 207 lb 1.9 oz (93.9 kg)  11/15/21 219 lb (99.3 kg)  Orders: -     Amb Referral To Provider Referral Exercise Program (P.R.E.P)  IFG (impaired fasting glucose) -     Hemoglobin A1c  Vitamin D deficiency -     VITAMIN D 25 Hydroxy (Vit-D Deficiency, Fractures)  Other specified hypothyroidism -     TSH + free T4  Other hyperlipidemia -     Lipid panel -     CMP14+EGFR -     CBC with Differential/Platelet    Follow-up: Return in about 3 months (around 08/19/2022).   Alvira Monday, FNP

## 2022-05-19 NOTE — Patient Instructions (Addendum)
I appreciate the opportunity to provide care to you today!    Follow up:  3 months  Labs: please stop by the lab during the week  to get your blood drawn (CBC, CMP, TSH, Lipid profile, HgA1c, Vit D)   Referrals today- Dermatology/ PREP program at the [facility]   Please continue to a heart-healthy diet and increase your physical activities. Try to exercise for 22mns at least five times a week.   Physical activity helps: Lower your blood glucose, improve your heart health, lower your blood pressure and cholesterol, burn calories to help manage her weight, gave you energy, lower stress, and improve his sleep.  The American diabetes Association (ADA) recommends being active for 2-1/2 hours (150 minutes) or more week.  Exercise for 30 minutes, 5 days a week (150 minutes total)    It was a pleasure to see you and I look forward to continuing to work together on your health and well-being. Please do not hesitate to call the office if you need care or have questions about your care.   Have a wonderful day and week. With Gratitude, GAlvira MondayMSN, FNP-BC

## 2022-05-19 NOTE — Assessment & Plan Note (Signed)
Onset of symptoms a month ago She reports that the lesion appears light brown at first and transition to a brownish color with increased pruritus She notes clear drainage from the lesion Unsure if her skin lesion is cancerous Will place a referral to dermatology for further evaluation Encouraged to continue using cortisone cream topically to relieve symptoms of pruritus

## 2022-05-19 NOTE — Assessment & Plan Note (Signed)
History of gastric bypass in 2019 She was on Wegovy 2.4 mg once weekly She reports that her insurance no longer covers Wegovy and would like to be started on oral antiobesity medication Patient has allergies to phentermine Referral placed to PREP program Encourage heart healthy diet Wt Readings from Last 3 Encounters:  05/19/22 198 lb 1.9 oz (89.9 kg)  02/14/22 207 lb 1.9 oz (93.9 kg)  11/15/21 219 lb (99.3 kg)

## 2022-05-26 ENCOUNTER — Other Ambulatory Visit (HOSPITAL_COMMUNITY): Payer: Self-pay

## 2022-05-27 ENCOUNTER — Encounter: Payer: Self-pay | Admitting: Family Medicine

## 2022-06-06 DIAGNOSIS — E038 Other specified hypothyroidism: Secondary | ICD-10-CM | POA: Diagnosis not present

## 2022-06-06 DIAGNOSIS — R7301 Impaired fasting glucose: Secondary | ICD-10-CM | POA: Diagnosis not present

## 2022-06-06 DIAGNOSIS — E559 Vitamin D deficiency, unspecified: Secondary | ICD-10-CM | POA: Diagnosis not present

## 2022-06-06 DIAGNOSIS — Z1211 Encounter for screening for malignant neoplasm of colon: Secondary | ICD-10-CM | POA: Diagnosis not present

## 2022-06-06 DIAGNOSIS — E7849 Other hyperlipidemia: Secondary | ICD-10-CM | POA: Diagnosis not present

## 2022-06-07 LAB — CBC WITH DIFFERENTIAL/PLATELET
Basophils Absolute: 0 10*3/uL (ref 0.0–0.2)
Basos: 1 %
EOS (ABSOLUTE): 0.2 10*3/uL (ref 0.0–0.4)
Eos: 3 %
Hematocrit: 37.3 % (ref 34.0–46.6)
Hemoglobin: 12.3 g/dL (ref 11.1–15.9)
Immature Grans (Abs): 0 10*3/uL (ref 0.0–0.1)
Immature Granulocytes: 0 %
Lymphocytes Absolute: 3 10*3/uL (ref 0.7–3.1)
Lymphs: 52 %
MCH: 29.5 pg (ref 26.6–33.0)
MCHC: 33 g/dL (ref 31.5–35.7)
MCV: 89 fL (ref 79–97)
Monocytes Absolute: 0.3 10*3/uL (ref 0.1–0.9)
Monocytes: 5 %
Neutrophils Absolute: 2.3 10*3/uL (ref 1.4–7.0)
Neutrophils: 39 %
Platelets: 233 10*3/uL (ref 150–450)
RBC: 4.17 x10E6/uL (ref 3.77–5.28)
RDW: 12.5 % (ref 11.7–15.4)
WBC: 5.8 10*3/uL (ref 3.4–10.8)

## 2022-06-07 LAB — CMP14+EGFR
ALT: 18 IU/L (ref 0–32)
AST: 19 IU/L (ref 0–40)
Albumin/Globulin Ratio: 1.9 (ref 1.2–2.2)
Albumin: 4.3 g/dL (ref 3.9–4.9)
Alkaline Phosphatase: 87 IU/L (ref 44–121)
BUN/Creatinine Ratio: 13 (ref 9–23)
BUN: 11 mg/dL (ref 6–24)
Bilirubin Total: 0.4 mg/dL (ref 0.0–1.2)
CO2: 25 mmol/L (ref 20–29)
Calcium: 9.7 mg/dL (ref 8.7–10.2)
Chloride: 101 mmol/L (ref 96–106)
Creatinine, Ser: 0.82 mg/dL (ref 0.57–1.00)
Globulin, Total: 2.3 g/dL (ref 1.5–4.5)
Glucose: 87 mg/dL (ref 70–99)
Potassium: 4.9 mmol/L (ref 3.5–5.2)
Sodium: 138 mmol/L (ref 134–144)
Total Protein: 6.6 g/dL (ref 6.0–8.5)
eGFR: 88 mL/min/{1.73_m2} (ref >59)

## 2022-06-07 LAB — TSH+FREE T4
Free T4: 1.18 ng/dL (ref 0.82–1.77)
TSH: 2.42 u[IU]/mL (ref 0.450–4.500)

## 2022-06-07 LAB — LIPID PANEL
Chol/HDL Ratio: 2.4 ratio (ref 0.0–4.4)
Cholesterol, Total: 161 mg/dL (ref 100–199)
HDL: 66 mg/dL (ref >39)
LDL Chol Calc (NIH): 85 mg/dL (ref 0–99)
Triglycerides: 46 mg/dL (ref 0–149)
VLDL Cholesterol Cal: 10 mg/dL (ref 5–40)

## 2022-06-07 LAB — HEMOGLOBIN A1C
Est. average glucose Bld gHb Est-mCnc: 120 mg/dL
Hgb A1c MFr Bld: 5.8 % — ABNORMAL HIGH (ref 4.8–5.6)

## 2022-06-07 LAB — VITAMIN D 25 HYDROXY (VIT D DEFICIENCY, FRACTURES): Vit D, 25-Hydroxy: 47.1 ng/mL (ref 30.0–100.0)

## 2022-06-12 LAB — COLOGUARD: COLOGUARD: NEGATIVE

## 2022-07-23 ENCOUNTER — Ambulatory Visit: Payer: Commercial Managed Care - PPO | Admitting: Dermatology

## 2022-07-23 ENCOUNTER — Encounter: Payer: Self-pay | Admitting: Dermatology

## 2022-07-23 DIAGNOSIS — L821 Other seborrheic keratosis: Secondary | ICD-10-CM

## 2022-07-23 NOTE — Patient Instructions (Signed)
Due to recent changes in healthcare laws, you may see results of your pathology and/or laboratory studies on MyChart before the doctors have had a chance to review them. We understand that in some cases there may be results that are confusing or concerning to you. Please understand that not all results are received at the same time and often the doctors may need to interpret multiple results in order to provide you with the best plan of care or course of treatment. Therefore, we ask that you please give us 2 business days to thoroughly review all your results before contacting the office for clarification. Should we see a critical lab result, you will be contacted sooner.   If You Need Anything After Your Visit  If you have any questions or concerns for your doctor, please call our main line at [phone] If no one answers, please leave a voicemail as directed and we will return your call as soon as possible. Messages left after 4 pm will be answered the following business day.   You may also send us a message via MyChart. We typically respond to MyChart messages within 1-2 business days.  For prescription refills, please ask your pharmacy to contact our office. Our fax number is [phone].  If you have an urgent issue when the clinic is closed that cannot wait until the next business day, you can page your doctor at the number below.    Please note that while we do our best to be available for urgent issues outside of office hours, we are not available 24/7.   If you have an urgent issue and are unable to reach us, you may choose to seek medical care at your doctor's office, retail clinic, urgent care center, or emergency room.  If you have a medical emergency, please immediately call 911 or go to the emergency department. In the event of inclement weather, please call our main line at [phone] for an update on the status of any delays or closures.  Dermatology Medication Tips: Please  keep the boxes that topical medications come in in order to help keep track of the instructions about where and how to use these. Pharmacies typically print the medication instructions only on the boxes and not directly on the medication tubes.   If your medication is too expensive, please contact our office at [phone] or send us a message through MyChart.   We are unable to tell what your co-pay for medications will be in advance as this is different depending on your insurance coverage. However, we may be able to find a substitute medication at lower cost or fill out paperwork to get insurance to cover a needed medication.   If a prior authorization is required to get your medication covered by your insurance company, please allow us 1-2 business days to complete this process.  Drug prices often vary depending on where the prescription is filled and some pharmacies may offer cheaper prices.  The website www.goodrx.com contains coupons for medications through different pharmacies. The prices here do not account for what the cost may be with help from insurance (it may be cheaper with your insurance), but the website can give you the price if you did not use any insurance.  - You can print the associated coupon and take it with your prescription to the pharmacy.  - You may also stop by our office during regular business hours and pick up a GoodRx coupon card.  - If you need your   prescription sent electronically to a different pharmacy, notify our office through [place] MyChart or by phone at [phone]     

## 2022-07-23 NOTE — Progress Notes (Signed)
   New Patient Visit   Subjective  Monica Hernandez is a 47 y.o. female who presents for the following: Spot   Patient has spots around waist line. Has been there for the past three months. They peel and itch. Sometimes a clear fluid comes out. Does not bleed. Another spot in the left rib area that presented itself three weeks ago and does the same thing. No family hx of skin cancer  The following portions of the chart were reviewed this encounter and updated as appropriate: medications, allergies, medical history  Review of Systems:  No other skin or systemic complaints except as noted in HPI or Assessment and Plan.  Objective  Well appearing patient in no apparent distress; mood and affect are within normal limits.  A focused examination was performed of the following areas: Waist line, Left rib area  Relevant exam findings are noted in the Assessment and Plan.     Assessment & Plan  SEBORRHEIC KERATOSIS Exam: Stuck-on, waxy, tan-brown papules and/or plaques   I counseled the patient regarding the following: Skin Care: Seborrheic Keratoses are benign. No treatment is necessary. Expectations: Seborrheic Keratoses are benign warty growths. Patients get more of them as they age.   Treatment: -discussed benign prognosis  -Discussed possible LN2 treatment --> declined at this time     Return FBSE.    Documentation: I have reviewed the above documentation for accuracy and completeness, and I agree with the above.  Langston Reusing, DO  I, Germaine Pomfret, CMA, am acting as scribe for [employer], DO.

## 2022-07-31 ENCOUNTER — Other Ambulatory Visit: Payer: Self-pay | Admitting: Family Medicine

## 2022-08-01 ENCOUNTER — Other Ambulatory Visit (HOSPITAL_COMMUNITY): Payer: Self-pay

## 2022-08-01 ENCOUNTER — Other Ambulatory Visit: Payer: Self-pay

## 2022-08-01 MED ORDER — WEGOVY 2.4 MG/0.75ML ~~LOC~~ SOAJ
2.4000 mg | SUBCUTANEOUS | 4 refills | Status: AC
  Filled 2022-08-01: qty 3, 28d supply, fill #0

## 2022-08-14 ENCOUNTER — Other Ambulatory Visit (HOSPITAL_COMMUNITY): Payer: Self-pay

## 2022-08-18 ENCOUNTER — Encounter: Payer: Self-pay | Admitting: Dermatology

## 2022-08-22 ENCOUNTER — Ambulatory Visit: Payer: Commercial Managed Care - PPO | Admitting: Family Medicine

## 2022-09-14 ENCOUNTER — Telehealth: Payer: Commercial Managed Care - PPO | Admitting: Family

## 2022-09-14 DIAGNOSIS — W57XXXA Bitten or stung by nonvenomous insect and other nonvenomous arthropods, initial encounter: Secondary | ICD-10-CM | POA: Diagnosis not present

## 2022-09-14 DIAGNOSIS — S80869A Insect bite (nonvenomous), unspecified lower leg, initial encounter: Secondary | ICD-10-CM | POA: Diagnosis not present

## 2022-09-14 MED ORDER — PREDNISONE 20 MG PO TABS: 40.0000 mg | ORAL_TABLET | Freq: Every day | ORAL | 0 refills | Status: AC

## 2022-09-14 MED ORDER — DOXYCYCLINE HYCLATE 100 MG PO TABS: 100.0000 mg | ORAL_TABLET | Freq: Two times a day (BID) | ORAL | 0 refills | Status: DC

## 2022-09-14 NOTE — Addendum Note (Signed)
Addended by: Jannifer Rodney A on: 09/14/2022 09:20 AM   Modules accepted: Level of Service

## 2022-09-14 NOTE — Addendum Note (Signed)
Addended by: Jannifer Rodney A on: 09/14/2022 10:59 AM   Modules accepted: Orders

## 2022-09-14 NOTE — Progress Notes (Signed)
E-Visit for Insect Sting  Thank you for describing the insect sting for [place].  Here is how we plan to help!  An uncomplicated insect sting that just occurred and can be closely followed using the instructions in your care plan.  The 2 greatest risks from insect stings are allergic reaction, which can be fatal in some people and infection, which is more common and less serious.  Bees, wasps, yellow jackets, and hornets belong to a class of insects called Hymenoptera.  Most insect stings cause only minor discomfort.  Stings can happen anywhere on the body and can be painful.  Most stings are from honey bees or yellow jackets.  Fire ants can sting multiple times.  The sites of the stings are more likely to become infected.    Provided a home care guide for insect stings and instructions on when to call for help. and I have sent in prednisone 40 mg by mouth daily for 5 days to the pharmacy you selected.  Please make sure that you selected a pharmacy that is open now.  What can be used to prevent Insect Stings?  Insect repellant with at least 20% DEET.  Wearing long pants and shirts with socks and shoes.  Wear dark or drab-colored clothes rather than bright colors.  Avoid using perfumes and hair sprays; these attract insects.  HOME CARE ADVICE:  1. Stinger removal: The stinger looks like a tiny black dot in the sting. Use a fingernail, credit card edge, or knife-edge to scrape it off.  Don't pull it out because it squeezes out more venom. If the stinger is below the skin surface, leave it alone.  It will be shed with normal skin healing. 2. Use cold compresses to the area of the sting for 10-20 minutes.  You may repeat this as needed to relieve symptoms of pain and swelling. 3.  For pain relief, take acetominophen 650 mg 4-6 hours as needed or ibuprofen 400 mg every 6-8 hours as needed or naproxen 250-500 mg every 12 hours as needed. 4.  You can also use hydrocortisone cream 0.5% or 1% up to 4  times daily as needed for itching. 5.  If the sting becomes very itchy, take Benadryl 25-50 mg, follow directions on box. 6.  Wash the area 2-3 times daily with antibacterial soap and warm water. 7. Call your Doctor if: Fever, a severe headache, or rash occur in the next 2 weeks. Sting area begins to look infected. Redness and swelling worsens after home treatment. Your current symptoms become worse.    MAKE SURE YOU:  Understand these instructions. Will watch your condition. Will get help right away if you are not doing well or get worse.  Thank you for choosing an e-visit.  Your e-visit answers were reviewed by a board certified advanced clinical practitioner to complete your personal care plan. Depending upon the condition, your plan could have included both over the counter or prescription medications.  Please review your pharmacy choice. Make sure the pharmacy is open so you can pick up prescription now. If there is a problem, you may contact your provider through [employer] and have the prescription routed to another pharmacy.  Your safety is important to [place]. If you have drug allergies check your prescription carefully.   For the next 24 hours you can use MyChart to ask questions about today's visit, request a non-urgent call back, or ask for a work or school excuse. You will get an email in the  next two days asking about your experience. I hope that your e-visit has been valuable and will speed your recovery.  Approximately 5 minutes was spent documenting and reviewing patient's chart.

## 2022-09-23 ENCOUNTER — Encounter: Payer: Self-pay | Admitting: Dermatology

## 2022-09-23 ENCOUNTER — Ambulatory Visit (INDEPENDENT_AMBULATORY_CARE_PROVIDER_SITE_OTHER): Payer: Commercial Managed Care - PPO | Admitting: Dermatology

## 2022-09-23 ENCOUNTER — Telehealth: Payer: Commercial Managed Care - PPO | Admitting: Physician Assistant

## 2022-09-23 DIAGNOSIS — D2271 Melanocytic nevi of right lower limb, including hip: Secondary | ICD-10-CM | POA: Diagnosis not present

## 2022-09-23 DIAGNOSIS — D239 Other benign neoplasm of skin, unspecified: Secondary | ICD-10-CM

## 2022-09-23 DIAGNOSIS — D2372 Other benign neoplasm of skin of left lower limb, including hip: Secondary | ICD-10-CM | POA: Diagnosis not present

## 2022-09-23 DIAGNOSIS — L821 Other seborrheic keratosis: Secondary | ICD-10-CM

## 2022-09-23 DIAGNOSIS — D229 Melanocytic nevi, unspecified: Secondary | ICD-10-CM

## 2022-09-23 DIAGNOSIS — D2261 Melanocytic nevi of right upper limb, including shoulder: Secondary | ICD-10-CM

## 2022-09-23 DIAGNOSIS — J069 Acute upper respiratory infection, unspecified: Secondary | ICD-10-CM | POA: Diagnosis not present

## 2022-09-23 DIAGNOSIS — D1801 Hemangioma of skin and subcutaneous tissue: Secondary | ICD-10-CM

## 2022-09-23 DIAGNOSIS — B078 Other viral warts: Secondary | ICD-10-CM | POA: Diagnosis not present

## 2022-09-23 DIAGNOSIS — L814 Other melanin hyperpigmentation: Secondary | ICD-10-CM

## 2022-09-23 DIAGNOSIS — L578 Other skin changes due to chronic exposure to nonionizing radiation: Secondary | ICD-10-CM

## 2022-09-23 DIAGNOSIS — D492 Neoplasm of unspecified behavior of bone, soft tissue, and skin: Secondary | ICD-10-CM

## 2022-09-23 DIAGNOSIS — Z1283 Encounter for screening for malignant neoplasm of skin: Secondary | ICD-10-CM

## 2022-09-23 DIAGNOSIS — W908XXA Exposure to other nonionizing radiation, initial encounter: Secondary | ICD-10-CM | POA: Diagnosis not present

## 2022-09-23 HISTORY — DX: Other benign neoplasm of skin, unspecified: D23.9

## 2022-09-23 MED ORDER — BENZONATATE 100 MG PO CAPS: 100.0000 mg | ORAL_CAPSULE | Freq: Three times a day (TID) | ORAL | 0 refills | Status: DC | PRN

## 2022-09-23 NOTE — Progress Notes (Signed)
   Follow-Up Visit   Subjective  Monica Hernandez is a 47 y.o. female who presents for the following: Skin Cancer Screening and Full Body Skin Exam. No personal H/O skin cancer, dysplastic nevi or actinic keratoses.   The patient presents for Total-Body Skin Exam (TBSE) for skin cancer screening and mole check. The patient has spots, moles and lesions to be evaluated, some may be new or changing and the patient may have concern these could be cancer.    The following portions of the chart were reviewed this encounter and updated as appropriate: medications, allergies, medical history  Review of Systems:  No other skin or systemic complaints except as noted in HPI or Assessment and Plan.  Objective  Well appearing patient in no apparent distress; mood and affect are within normal limits.  A full examination was performed including scalp, head, eyes, ears, nose, lips, neck, chest, axillae, abdomen, back, buttocks, bilateral upper extremities, bilateral lower extremities, hands, feet, fingers, toes, fingernails, and toenails. All findings within normal limits unless otherwise noted below.   Relevant physical exam findings are noted in the Assessment and Plan.  left anterior neck x1 Brown verrucous papule  Right Shoulder - Posterior 5 mm brown macule with black speckles       Right Thigh - Posterior 7 mm irregular brown macule with black speckles          Assessment & Plan   SKIN CANCER SCREENING PERFORMED TODAY.  ACTINIC DAMAGE, Mild - Chronic condition, secondary to cumulative UV/sun exposure - diffuse scaly erythematous macules with underlying dyspigmentation - Recommend daily broad spectrum sunscreen SPF 30+ to sun-exposed areas, reapply every 2 hours as needed.  - Staying in the shade or wearing long sleeves, sun glasses (UVA+UVB protection) and wide brim hats (4-inch brim around the entire circumference of the hat) are also recommended for sun protection.  - Call for  new or changing lesions.  LENTIGINES, SEBORRHEIC KERATOSES, HEMANGIOMAS - Benign normal skin lesions - Benign-appearing - Call for any changes  MELANOCYTIC NEVI - Tan-brown and/or pink-flesh-colored symmetric macules and papules - Benign appearing on exam today - Observation - Call clinic for new or changing moles - Recommend daily use of broad spectrum spf 30+ sunscreen to sun-exposed areas.   DERMATOFIBROMA Exam: Firm pink/brown papulenodule with dimple sign at left thigh. Treatment Plan: A dermatofibroma is a benign growth possibly related to trauma, such as an insect bite, cut from shaving, or inflamed acne-type bump.  Treatment options to remove include shave or excision with resulting scar and risk of recurrence.  Since benign-appearing and not bothersome, will observe for now.     Return in about 1 year (around 09/23/2023) for TBSE.  I, Lawson Radar, CMA, am acting as scribe for Langston Reusing, DO.   Documentation: I have reviewed the above documentation for accuracy and completeness, and I agree with the above.  Langston Reusing, DO

## 2022-09-23 NOTE — Patient Instructions (Addendum)
Thank you for visiting today and for your commitment to maintaining your health, especially considering your history with skin cancer. It was good to see you again. Here is a summary of the key points from today's examination and the next steps for your treatment:  - Skin Examination: We conducted a full-body skin exam. Your face and most areas are clear without any suspicious findings. - Treatment of Skin Lesions:   - Seborrheic Keratosis: Noted on your face, no treatment needed as these are benign.   - Actinic Damage: Observed on your shoulders with mild changes.   - Dermatofibroma: Identified on your left thigh, no treatment needed. - Biopsies Scheduled:   - Left Lower Neck: A brown brucus papule was frozen off.   - Right Shoulder and Right Posterior Thigh: Shave biopsies were performed on irregular brown macules with black speckles to rule out dysplastic nevi. - Post-Procedure Care:   - Keep the biopsy sites clean; let water run over them in the shower.   - Apply Vaseline and a Band-Aid once a day for about a week, then continue with Vaseline as needed. - Follow-Up:   - Results from the biopsies will be available in about a week. We will contact you via MyChart for normal results or call you if further surgical intervention is required.   - Annual skin checks are recommended to monitor and manage any new or changing lesions.  Please ensure to follow the post-procedure care instructions to promote healing and prevent infection. If you have any questions or concerns before your next appointment, do not hesitate to contact our office.     Cryotherapy Aftercare  Wash gently with soap and water everyday.   Apply Vaseline and Band-Aid daily until healed.    Patient Handout: Wound Care for Skin Biopsy Site  Patient Handout: Wound Care for Skin Biopsy Site  Taking Care of Your Skin Biopsy Site  Proper care of the biopsy site is essential for promoting healing and minimizing scarring.  This handout provides instructions on how to care for your biopsy site to ensure optimal recovery.  1. Cleaning the Wound:  Clean the biopsy site daily with gentle soap and water. Gently pat the area dry with a clean, soft towel. Avoid harsh scrubbing or rubbing the area, as this can irritate the skin and delay healing.  2. Applying Aquaphor and Bandage:  After cleaning the wound, apply a thin layer of Aquaphor ointment to the biopsy site. Cover the area with a sterile bandage to protect it from dirt, bacteria, and friction. Change the bandage daily or as needed if it becomes soiled or wet.  3. Continued Care for One Week:  Repeat the cleaning, Aquaphor application, and bandaging process daily for one week following the biopsy procedure. Keeping the wound clean and moist during this initial healing period will help prevent infection and promote optimal healing.  4. Massaging Aquaphor into the Area:  ---After one week, discontinue the use of bandages but continue to apply Aquaphor to the biopsy site. ----Gently massage the Aquaphor into the area using circular motions. ---Massaging the skin helps to promote circulation and prevent the formation of scar tissue.   Additional Tips:  Avoid exposing the biopsy site to direct sunlight during the healing process, as this can cause hyperpigmentation or worsen scarring. If you experience any signs of infection, such as increased redness, swelling, warmth, or drainage from the wound, contact your healthcare provider immediately. Follow any additional instructions provided by your healthcare provider  for caring for the biopsy site and managing any discomfort. Conclusion:  Taking proper care of your skin biopsy site is crucial for ensuring optimal healing and minimizing scarring. By following these instructions for cleaning, applying Aquaphor, and massaging the area, you can promote a smooth and successful recovery. If you have any questions or  concerns about caring for your biopsy site, don't hesitate to contact your healthcare provider for guidance.      Due to recent changes in healthcare laws, you may see results of your pathology and/or laboratory studies on MyChart before the doctors have had a chance to review them. We understand that in some cases there may be results that are confusing or concerning to you. Please understand that not all results are received at the same time and often the doctors may need to interpret multiple results in order to provide you with the best plan of care or course of treatment. Therefore, we ask that you please give [place] 2 business days to thoroughly review all your results before contacting the office for clarification. Should we see a critical lab result, you will be contacted sooner.   If You Need Anything After Your Visit  If you have any questions or concerns for your doctor, please call our main line at [phone] If no one answers, please leave a voicemail as directed and we will return your call as soon as possible. Messages left after 4 pm will be answered the following business day.   You may also send [place] a message via MyChart. We typically respond to MyChart messages within 1-2 business days.  For prescription refills, please ask your pharmacy to contact our office. Our fax number is [phone].  If you have an urgent issue when the clinic is closed that cannot wait until the next business day, you can page your doctor at the number below.    Please note that while we do our best to be available for urgent issues outside of office hours, we are not available 24/7.   If you have an urgent issue and are unable to reach [place], you may choose to seek medical care at your doctor's office, retail clinic, urgent care center, or emergency room.  If you have a medical emergency, please immediately call 911 or go to the emergency department. In the event of inclement weather, please call our  main line at [phone] for an update on the status of any delays or closures.  Dermatology Medication Tips: Please keep the boxes that topical medications come in in order to help keep track of the instructions about where and how to use these. Pharmacies typically print the medication instructions only on the boxes and not directly on the medication tubes.   If your medication is too expensive, please contact our office at [phone] or send [place] a message through MyChart.   We are unable to tell what your co-pay for medications will be in advance as this is different depending on your insurance coverage. However, we may be able to find a substitute medication at lower cost or fill out paperwork to get insurance to cover a needed medication.   If a prior authorization is required to get your medication covered by your insurance company, please allow [place] 1-2 business days to complete this process.  Drug prices often vary depending on where the prescription is filled and some pharmacies may offer cheaper prices.  The website www.goodrx.com contains coupons for medications through different pharmacies. The prices here do  not account for what the cost may be with help from insurance (it may be cheaper with your insurance), but the website can give you the price if you did not use any insurance.  - You can print the associated coupon and take it with your prescription to the pharmacy.  - You may also stop by our office during regular business hours and pick up a GoodRx coupon card.  - If you need your prescription sent electronically to a different pharmacy, notify our office through [facility] or by phone at [phone]

## 2022-09-23 NOTE — Progress Notes (Signed)
I have spent 5 minutes in review of e-visit questionnaire, review and updating patient chart, medical decision making and response to patient.   William Cody Martin, PA-C    

## 2022-09-23 NOTE — Progress Notes (Signed)

## 2022-09-29 ENCOUNTER — Encounter: Payer: Self-pay | Admitting: Dermatology

## 2022-09-30 ENCOUNTER — Telehealth: Payer: Self-pay

## 2022-09-30 NOTE — Telephone Encounter (Signed)
Responded to My chart message asking for results

## 2022-09-30 NOTE — Progress Notes (Signed)
Hi Beacher May  Dr. Onalee Hua reviewed your biopsy results and they showed the spot removed was a little "abnormal" but not cancerous.  No additional treatment is required.  We will continue to monitor the area for re-pigmentation during your annual skin exams. The detailed report is available to view in MyChart.  Have a great day!  Kind Regards,  Dr. Kermit Balo Care Team

## 2022-10-01 ENCOUNTER — Encounter: Payer: Self-pay | Admitting: Dermatology

## 2022-10-08 ENCOUNTER — Other Ambulatory Visit: Payer: Self-pay

## 2022-10-08 ENCOUNTER — Other Ambulatory Visit (HOSPITAL_COMMUNITY): Payer: Self-pay

## 2022-10-08 ENCOUNTER — Other Ambulatory Visit: Payer: Self-pay | Admitting: Student

## 2022-10-08 ENCOUNTER — Other Ambulatory Visit: Payer: Self-pay | Admitting: Oncology

## 2022-10-08 DIAGNOSIS — Z006 Encounter for examination for normal comparison and control in clinical research program: Secondary | ICD-10-CM

## 2022-10-08 MED ORDER — METOPROLOL SUCCINATE ER 25 MG PO TB24
25.0000 mg | ORAL_TABLET | Freq: Every day | ORAL | 3 refills | Status: DC
  Filled 2022-10-08: qty 90, 90d supply, fill #0
  Filled 2023-02-16: qty 90, 90d supply, fill #1
  Filled 2023-07-31: qty 90, 90d supply, fill #2

## 2022-10-17 ENCOUNTER — Encounter (HOSPITAL_COMMUNITY): Payer: Self-pay | Admitting: *Deleted

## 2022-12-13 ENCOUNTER — Encounter (HOSPITAL_COMMUNITY): Payer: Self-pay | Admitting: Emergency Medicine

## 2022-12-13 ENCOUNTER — Emergency Department (HOSPITAL_COMMUNITY): Payer: Commercial Managed Care - PPO

## 2022-12-13 ENCOUNTER — Encounter: Payer: Self-pay | Admitting: Family Medicine

## 2022-12-13 ENCOUNTER — Other Ambulatory Visit: Payer: Self-pay

## 2022-12-13 ENCOUNTER — Emergency Department (HOSPITAL_COMMUNITY)
Admission: EM | Admit: 2022-12-13 | Discharge: 2022-12-13 | Disposition: A | Discharge status: Home / Self Care | Payer: Commercial Managed Care - PPO | Attending: Emergency Medicine | Admitting: Emergency Medicine

## 2022-12-13 DIAGNOSIS — M5441 Lumbago with sciatica, right side: Secondary | ICD-10-CM | POA: Diagnosis not present

## 2022-12-13 DIAGNOSIS — M5431 Sciatica, right side: Secondary | ICD-10-CM | POA: Diagnosis not present

## 2022-12-13 DIAGNOSIS — R252 Cramp and spasm: Secondary | ICD-10-CM | POA: Diagnosis present

## 2022-12-13 DIAGNOSIS — M47816 Spondylosis without myelopathy or radiculopathy, lumbar region: Secondary | ICD-10-CM | POA: Diagnosis not present

## 2022-12-13 DIAGNOSIS — M545 Low back pain, unspecified: Secondary | ICD-10-CM | POA: Diagnosis not present

## 2022-12-13 DIAGNOSIS — M79606 Pain in leg, unspecified: Secondary | ICD-10-CM | POA: Diagnosis not present

## 2022-12-13 LAB — CBC WITH DIFFERENTIAL/PLATELET
Abs Immature Granulocytes: 0.02 10*3/uL (ref 0.00–0.07)
Basophils Absolute: 0 10*3/uL (ref 0.0–0.1)
Basophils Relative: 1 %
Eosinophils Absolute: 0.2 10*3/uL (ref 0.0–0.5)
Eosinophils Relative: 3 %
HCT: 35.1 % — ABNORMAL LOW (ref 36.0–46.0)
Hemoglobin: 11.1 g/dL — ABNORMAL LOW (ref 12.0–15.0)
Immature Granulocytes: 0 %
Lymphocytes Relative: 38 %
Lymphs Abs: 2.9 10*3/uL (ref 0.7–4.0)
MCH: 28.4 pg (ref 26.0–34.0)
MCHC: 31.6 g/dL (ref 30.0–36.0)
MCV: 89.8 fL (ref 80.0–100.0)
Monocytes Absolute: 0.6 10*3/uL (ref 0.1–1.0)
Monocytes Relative: 7 %
Neutro Abs: 3.8 10*3/uL (ref 1.7–7.7)
Neutrophils Relative %: 51 %
Platelets: 237 10*3/uL (ref 150–400)
RBC: 3.91 MIL/uL (ref 3.87–5.11)
RDW: 14 % (ref 11.5–15.5)
WBC: 7.5 10*3/uL (ref 4.0–10.5)
nRBC: 0 % (ref 0.0–0.2)

## 2022-12-13 LAB — COMPREHENSIVE METABOLIC PANEL
ALT: 20 U/L (ref 0–44)
AST: 26 U/L (ref 15–41)
Albumin: 3.7 g/dL (ref 3.5–5.0)
Alkaline Phosphatase: 73 U/L (ref 38–126)
Anion gap: 11 (ref 5–15)
BUN: 18 mg/dL (ref 6–20)
CO2: 24 mmol/L (ref 22–32)
Calcium: 8.7 mg/dL — ABNORMAL LOW (ref 8.9–10.3)
Chloride: 102 mmol/L (ref 98–111)
Creatinine, Ser: 0.79 mg/dL (ref 0.44–1.00)
GFR, Estimated: >60 mL/min (ref >60)
Glucose, Bld: 104 mg/dL — ABNORMAL HIGH (ref 70–99)
Potassium: 3.8 mmol/L (ref 3.5–5.1)
Sodium: 137 mmol/L (ref 135–145)
Total Bilirubin: 0.4 mg/dL (ref 0.3–1.2)
Total Protein: 6.7 g/dL (ref 6.5–8.1)

## 2022-12-13 LAB — D-DIMER, QUANTITATIVE: D-Dimer, Quant: <0.27 ug{FEU}/mL (ref 0.00–0.50)

## 2022-12-13 MED ORDER — MELOXICAM 15 MG PO TABS
15.0000 mg | ORAL_TABLET | Freq: Once | ORAL | Status: AC
  Administered 2022-12-13: 15 mg via ORAL
  Filled 2022-12-13: qty 2
  Filled 2022-12-13: qty 1

## 2022-12-13 MED ORDER — MELOXICAM 7.5 MG PO TABS: 7.5000 mg | ORAL_TABLET | Freq: Every day | ORAL | 0 refills | Status: AC

## 2022-12-13 MED ORDER — METHYLPREDNISOLONE SODIUM SUCC 125 MG IJ SOLR
125.0000 mg | Freq: Once | INTRAMUSCULAR | Status: DC
  Filled 2022-12-13: qty 2

## 2022-12-13 MED ORDER — PANTOPRAZOLE SODIUM 40 MG PO TBEC: 40.0000 mg | DELAYED_RELEASE_TABLET | Freq: Two times a day (BID) | ORAL | 0 refills | Status: AC

## 2022-12-13 MED ORDER — METHOCARBAMOL 500 MG PO TABS: 500.0000 mg | ORAL_TABLET | Freq: Three times a day (TID) | ORAL | 0 refills | Status: DC | PRN

## 2022-12-13 MED ORDER — PREDNISONE 20 MG PO TABS: ORAL_TABLET | ORAL | 0 refills | Status: DC

## 2022-12-13 MED ORDER — GABAPENTIN 100 MG PO CAPS: 100.0000 mg | ORAL_CAPSULE | Freq: Three times a day (TID) | ORAL | 0 refills | Status: DC | PRN

## 2022-12-13 MED ORDER — GABAPENTIN 100 MG PO CAPS
100.0000 mg | ORAL_CAPSULE | Freq: Once | ORAL | Status: AC
  Administered 2022-12-13: 100 mg via ORAL
  Filled 2022-12-13: qty 1

## 2022-12-13 NOTE — ED Triage Notes (Signed)
Pt here with c/o cramping from "Big toe to hip" on right lower extremity that started approximately 20 mins PTA. Pt describes it as "like a massive [place] Horse". Pt ambulatory to room from triage.

## 2022-12-13 NOTE — ED Notes (Signed)
Pt ambulated in room  Pt states she feels fine moving When asked about pain, she states none at this time, and that she feels fine to go home.  EDP aware
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
## 2022-12-29 ENCOUNTER — Ambulatory Visit: Payer: Commercial Managed Care - PPO | Admitting: Family Medicine

## 2022-12-29 ENCOUNTER — Encounter: Payer: Self-pay | Admitting: Family Medicine

## 2022-12-29 ENCOUNTER — Other Ambulatory Visit (HOSPITAL_COMMUNITY): Payer: Self-pay

## 2022-12-29 VITALS — BP 118/80 | HR 79 | Ht 64.0 in | Wt 215.1 lb

## 2022-12-29 DIAGNOSIS — R252 Cramp and spasm: Secondary | ICD-10-CM

## 2022-12-29 MED ORDER — GABAPENTIN 300 MG PO CAPS
300.0000 mg | ORAL_CAPSULE | Freq: Every day | ORAL | 3 refills | Status: DC
Start: 2022-12-29 — End: 2023-07-23
  Filled 2022-12-29 – 2023-01-06 (×4): qty 90, 90d supply, fill #0

## 2022-12-29 NOTE — Assessment & Plan Note (Signed)
The patient is advised to start taking gabapentin 300 mg at bedtime and to increase her fluid intake to at least 64 ounces daily. She is encouraged to perform stretching and strengthening exercises for the lower extremities. A daily vitamin B complex supplement is recommended to help with leg cramps.

## 2022-12-29 NOTE — Patient Instructions (Signed)
I appreciate the opportunity to provide care to you today!    Follow up:  1 months  Leg cramps Vitamin B complex (three times daily, containing 30 mg of vitamin B6)  Drinking tonic water   Attached with your AVS, you will find valuable resources for self-education. I highly recommend dedicating some time to thoroughly examine them.   Please continue to a heart-healthy diet and increase your physical activities. Try to exercise for at least five days a week.    It was a pleasure to see you and I look forward to continuing to work together on your health and well-being. Please do not hesitate to call the office if you need care or have questions about your care.  In case of emergency, please visit the Emergency Department for urgent care, or contact our clinic at [phone] to schedule an appointment. We're here to help you!   Have a wonderful day and week. With Gratitude, Gilmore Laroche MSN, FNP-BC

## 2022-12-29 NOTE — Progress Notes (Signed)
Established Patient Office Visit  Subjective:  Patient ID: Monica Hernandez, female    DOB: 02-25-1976  Age: 47 y.o. MRN: [id number]  CC:  Chief Complaint  Patient presents with   Follow-up    Hospital f/u from 10/05.     HPI Monica Hernandez is a 47 y.o. female with past medical history of migraines, asthma, arthritis presents for ED follow-up.  Sciatica of the Right Side and Leg Cramps:The patient was seen in the ED on 12/13/2022 for leg cramps radiating from her right big toe to her upper leg. She reports that the cramps occur suddenly and are unprovoked. She denies bowel or bladder incontinence, as well as numbness and tingling down her lower legs. However, she mentions that during severe cramps, she is sometimes unable to move her leg. The patient admits to minimal fluid intake and was treated with gabapentin 100 mg to take three times daily as needed, meloxicam, Robaxin, and prednisone. She has completed a course of prednisone.   Past Medical History:  Diagnosis Date   Allergy    Anemia    Asthma    Dysplastic nevus 09/23/2022   Right shoulder post - Moderate   Eczema    GERD (gastroesophageal reflux disease)    History of kidney stones    Hypertension    Kidney stone    Mitral valve prolapse    was told as a kid that she had this but during ECHO for eval, ECHO did not indicate this , see 08-25-16 epic result    PONV (postoperative nausea and vomiting)    epidural with c section caused N/V    Pre-diabetes    Recurrent upper respiratory infection (URI)    Ulcer     Past Surgical History:  Procedure Laterality Date   ABDOMINAL HYSTERECTOMY     total   ADENOIDECTOMY     ADENOIDECTOMY, TONSILLECTOMY AND MYRINGOTOMY WITH TUBE PLACEMENT     CESAREAN SECTION     FRACTURE SURGERY     GASTRIC ROUX-EN-Y N/A 03/31/2017   Procedure: LAPAROSCOPIC ROUX-EN-Y GASTRIC BYPASS WITH UPPER ENDOSCOPY;  Surgeon: Kinsinger, De Blanch, MD;  Location: [facility];  Service: General;   Laterality: N/A;   HERNIA REPAIR     umbilical   LEG SURGERY Left    MULTIPLE TOOTH EXTRACTIONS Bilateral 2023   TONSILLECTOMY     TUBAL LIGATION      Family History  Problem Relation Age of Onset   Hypertension Mother    Stroke Mother    Thyroid cancer Mother    Stroke Father    Lung cancer Father    Brain cancer Father    Bone cancer Father    Hypertension Sister    Thyroid cancer Sister    Hypertension Brother    Heart disease Maternal Grandmother    Diabetes Maternal Grandmother    Asthma Other    Cancer Other    Stomach cancer Neg Hx    Colon cancer Neg Hx     Social History   Socioeconomic History   Marital status: Married    Spouse name: Not on file   Number of children: 1   Years of education: Not on file   Highest education level: Some college, no degree  Occupational History    Employer: [employer]    Comment: heart care    Comment: [employer]  Tobacco Use   Smoking status: Former    Current packs/day: 0.00    Average packs/day: 0.5 packs/day  for 15.0 years (7.5 ttl pk-yrs)    Types: Cigarettes    Start date: 84    Quit date: 2010    Years since quitting: 14.8   Smokeless tobacco: Never   Tobacco comments:    smoked since age 22 ; quit in 2010   Vaping Use   Vaping status: Never Used  Substance and Sexual Activity   Alcohol use: No   Drug use: No   Sexual activity: Yes    Partners: Male    Birth control/protection: Surgical  Other Topics Concern   Not on file  Social History Narrative   Live with her spouse ans her son   Social Determinants of Health   Financial Resource Strain: Patient Declined (12/28/2022)   Overall Financial Resource Strain (CARDIA)    Difficulty of Paying Living Expenses: Patient declined  Food Insecurity: Patient Declined (12/28/2022)   Hunger Vital Sign    Worried About Running Out of Food in the Last Year: Patient declined    Ran Out of Food in the Last Year: Patient declined  Transportation Needs: No  Transportation Needs (12/28/2022)   PRAPARE - Administrator, Civil Service (Medical): No    Lack of Transportation (Non-Medical): No  Physical Activity: Sufficiently Active (12/28/2022)   Exercise Vital Sign    Days of Exercise per Week: 5 days    Minutes of Exercise per Session: 40 min  Stress: Stress Concern Present (12/28/2022)   [employer] of Occupational Health - Occupational Stress Questionnaire    Feeling of Stress : To some extent  Social Connections: Socially Isolated (12/28/2022)   Social Connection and Isolation Panel [NHANES]    Frequency of Communication with Friends and Family: Twice a week    Frequency of Social Gatherings with Friends and Family: Never    Attends Religious Services: Never    Database administrator or Organizations: No    Attends Engineer, structural: Not on file    Marital Status: Married  Catering manager Violence: Not on file    Outpatient Medications Prior to Visit  Medication Sig Dispense Refill   acetaminophen (TYLENOL) 500 MG tablet Take 1,000 mg by mouth every 6 (six) hours as needed for mild pain or moderate pain.     albuterol (VENTOLIN HFA) 108 (90 Base) MCG/ACT inhaler Inhale 2 puffs into the lungs every 6 (six) hours as needed for wheezing or shortness of breath. 8 g 0   Cholecalciferol (VITAMIN D3) 25 MCG (1000 UT) CAPS Take 1 capsule (1,000 Units total) by mouth daily. 60 capsule 3   EPINEPHrine 0.3 mg/0.3 mL IJ SOAJ injection Inject 0.3 mg into the muscle as needed for anaphylaxis. 2 each 1   furosemide (LASIX) 20 MG tablet Take 20 mg daily as needed for swelling, weight gain 90 tablet 3   gabapentin (NEURONTIN) 100 MG capsule Take 1 capsule (100 mg total) by mouth 3 (three) times daily as needed (if severe pain in leg). 30 capsule 0   meclizine (ANTIVERT) 25 MG tablet Take 1 tablet (25 mg total) by mouth 3 (three) times daily as needed for dizziness. 30 tablet 0   meloxicam (MOBIC) 7.5 MG tablet Take 1 tablet  (7.5 mg total) by mouth daily for 28 days. Or until symptoms are completely resolved 28 tablet 0   methocarbamol (ROBAXIN) 500 MG tablet Take 1 tablet (500 mg total) by mouth every 8 (eight) hours as needed for muscle spasms (if severe pain in leg). 30 tablet 0  metoprolol succinate (TOPROL-XL) 25 MG 24 hr tablet Take 1 tablet (25 mg total) by mouth daily. 90 tablet 3   Multiple Vitamins-Minerals (BARIATRIC MULTIVITAMINS/IRON PO) Take 2 tablets by mouth 2 (two) times daily. Chewable tablets     ondansetron (ZOFRAN) 4 MG tablet Take 1 tablet (4 mg total) by mouth every 8 (eight) hours as needed for nausea or vomiting. 20 tablet 0   benzonatate (TESSALON) 100 MG capsule Take 1 capsule (100 mg total) by mouth 3 (three) times daily as needed for cough. 30 capsule 0   amoxicillin-clavulanate (AUGMENTIN) 875-125 MG tablet Take 1 tablet by mouth 2 (two) times daily. 20 tablet 0   famotidine (PEPCID) 40 MG tablet Take 1 tablet (40 mg total) by mouth 2 (two) times daily. (Patient not taking: Reported on 12/29/2022) 60 tablet 3   pantoprazole (PROTONIX) 40 MG tablet Take 1 tablet (40 mg total) by mouth 2 (two) times daily before a meal for 14 days. 28 tablet 0   terbinafine (LAMISIL AT) 1 % cream Apply 1 Application topically 2 (two) times daily. 30 g 0   doxycycline (VIBRA-TABS) 100 MG tablet Take 1 tablet (100 mg total) by mouth 2 (two) times daily. 20 tablet 0   predniSONE (DELTASONE) 20 MG tablet 3 tabs po daily x 3 days, then 2 tabs x 3 days, then 1.5 tabs x 3 days, then 1 tab x 3 days, then 0.5 tabs x 3 days 27 tablet 0   No facility-administered medications prior to visit.    Allergies  Allergen Reactions   Bee Venom Anaphylaxis   Contrave [Naltrexone-Bupropion Hcl Er] Nausea And Vomiting    Pt states it causes bloody stool, bloody vomit, and heart palpitations   Erythromycin Nausea And Vomiting and Rash    Wheezing    Shellfish Allergy Anaphylaxis   Zithromax [Azithromycin] Nausea And  Vomiting, Rash and Other (See Comments)   Belviq [Lorcaserin Hcl] Other (See Comments)    Causes changes in vision and respiratory issues   Fexofenadine-Pseudoephed Er Nausea And Vomiting   Liraglutide -Weight Management Other (See Comments)    Causes fainting (per pt, dose was toohigh and decreased gluc too much)   Qsymia [Phentermine-Topiramate] Other (See Comments)    Causes change in vision and respiratory issues    Levocetirizine Other (See Comments)    headache   Methylprednisolone Other (See Comments)    Low heart rate   Naltrexone Other (See Comments)    Bloody stools.    Fish Allergy Rash   Sulfa Antibiotics Rash    ROS Review of Systems  Constitutional:  Negative for chills and fever.  Eyes:  Negative for visual disturbance.  Respiratory:  Negative for chest tightness and shortness of breath.   Musculoskeletal:        Leg cramps  Neurological:  Negative for dizziness and headaches.      Objective:    Physical Exam HENT:     Head: Normocephalic.     Mouth/Throat:     Mouth: Mucous membranes are moist.  Cardiovascular:     Rate and Rhythm: Normal rate.     Heart sounds: Normal heart sounds.  Pulmonary:     Effort: Pulmonary effort is normal.     Breath sounds: Normal breath sounds.  Neurological:     Mental Status: She is alert.     BP 118/80   Pulse 79   Ht 5\' 4"  (1.626 m)   Wt 215 lb 1.9 oz (97.6 kg)   SpO2  98%   BMI 36.93 kg/m  Wt Readings from Last 3 Encounters:  12/29/22 215 lb 1.9 oz (97.6 kg)  12/13/22 210 lb (95.3 kg)  05/19/22 198 lb 1.9 oz (89.9 kg)    Lab Results  Component Value Date   TSH 2.420 06/06/2022   Lab Results  Component Value Date   WBC 7.5 12/13/2022   HGB 11.1 (L) 12/13/2022   HCT 35.1 (L) 12/13/2022   MCV 89.8 12/13/2022   PLT 237 12/13/2022   Lab Results  Component Value Date   NA 137 12/13/2022   K 3.8 12/13/2022   CO2 24 12/13/2022   GLUCOSE 104 (H) 12/13/2022   BUN 18 12/13/2022   CREATININE 0.79  12/13/2022   BILITOT 0.4 12/13/2022   ALKPHOS 73 12/13/2022   AST 26 12/13/2022   ALT 20 12/13/2022   PROT 6.7 12/13/2022   ALBUMIN 3.7 12/13/2022   CALCIUM 8.7 (L) 12/13/2022   ANIONGAP 11 12/13/2022   EGFR 88 06/06/2022   Lab Results  Component Value Date   CHOL 161 06/06/2022   Lab Results  Component Value Date   HDL 66 06/06/2022   Lab Results  Component Value Date   LDLCALC 85 06/06/2022   Lab Results  Component Value Date   TRIG 46 06/06/2022   Lab Results  Component Value Date   CHOLHDL 2.4 06/06/2022   Lab Results  Component Value Date   HGBA1C 5.8 (H) 06/06/2022      Assessment & Plan:  Leg cramps Assessment & Plan: The patient is advised to start taking gabapentin 300 mg at bedtime and to increase her fluid intake to at least 64 ounces daily. She is encouraged to perform stretching and strengthening exercises for the lower extremities. A daily vitamin B complex supplement is recommended to help with leg cramps.   Orders: -     Gabapentin; Take 1 capsule (300 mg total) by mouth at bedtime.  Dispense: 90 capsule; Refill: 3   Note: This chart has been completed using Engineer, civil (consulting) software, and while attempts have been made to ensure accuracy, certain words and phrases may not be transcribed as intended.   Follow-up: Return in about 1 month (around 01/29/2023).   Gilmore Laroche, FNP

## 2023-01-01 ENCOUNTER — Other Ambulatory Visit (HOSPITAL_COMMUNITY): Payer: Self-pay

## 2023-01-01 ENCOUNTER — Encounter (HOSPITAL_COMMUNITY): Payer: Self-pay

## 2023-01-05 ENCOUNTER — Other Ambulatory Visit (HOSPITAL_COMMUNITY)
Admission: RE | Admit: 2023-01-05 | Discharge: 2023-01-05 | Disposition: A | Discharge status: Home / Self Care | Payer: Commercial Managed Care - PPO | Source: Ambulatory Visit | Attending: Oncology | Admitting: Oncology

## 2023-01-05 DIAGNOSIS — Z006 Encounter for examination for normal comparison and control in clinical research program: Secondary | ICD-10-CM | POA: Insufficient documentation

## 2023-01-06 ENCOUNTER — Other Ambulatory Visit (HOSPITAL_COMMUNITY): Payer: Self-pay

## 2023-01-06 ENCOUNTER — Other Ambulatory Visit: Payer: Self-pay

## 2023-01-07 ENCOUNTER — Other Ambulatory Visit: Payer: Self-pay

## 2023-01-07 ENCOUNTER — Encounter: Payer: Self-pay | Admitting: Pharmacist

## 2023-01-08 ENCOUNTER — Other Ambulatory Visit (HOSPITAL_COMMUNITY): Payer: Self-pay

## 2023-01-12 ENCOUNTER — Other Ambulatory Visit: Payer: Self-pay

## 2023-01-13 ENCOUNTER — Telehealth: Payer: Commercial Managed Care - PPO | Admitting: Physician Assistant

## 2023-01-13 ENCOUNTER — Encounter (INDEPENDENT_AMBULATORY_CARE_PROVIDER_SITE_OTHER): Payer: Self-pay

## 2023-01-13 DIAGNOSIS — J019 Acute sinusitis, unspecified: Secondary | ICD-10-CM | POA: Diagnosis not present

## 2023-01-13 DIAGNOSIS — B9689 Other specified bacterial agents as the cause of diseases classified elsewhere: Secondary | ICD-10-CM

## 2023-01-13 LAB — GENECONNECT MOLECULAR SCREEN

## 2023-01-13 LAB — HELIX MOLECULAR SCREEN: Genetic Analysis Overall Interpretation: NEGATIVE

## 2023-01-13 MED ORDER — DOXYCYCLINE HYCLATE 100 MG PO TABS
100.0000 mg | ORAL_TABLET | Freq: Two times a day (BID) | ORAL | 0 refills | Status: DC
Start: 2023-01-13 — End: 2023-06-06

## 2023-01-13 NOTE — Progress Notes (Signed)
I have spent 5 minutes in review of e-visit questionnaire, review and updating patient chart, medical decision making and response to patient.   Mia Milan Cody Jacklynn Dehaas, PA-C    

## 2023-01-13 NOTE — Progress Notes (Signed)

## 2023-01-24 ENCOUNTER — Telehealth: Payer: Commercial Managed Care - PPO | Admitting: Family Medicine

## 2023-01-24 DIAGNOSIS — N390 Urinary tract infection, site not specified: Secondary | ICD-10-CM

## 2023-01-24 MED ORDER — NITROFURANTOIN MONOHYD MACRO 100 MG PO CAPS
100.0000 mg | ORAL_CAPSULE | Freq: Two times a day (BID) | ORAL | 0 refills | Status: AC
Start: 2023-01-24 — End: 2023-01-29

## 2023-01-24 NOTE — Progress Notes (Signed)
E-Visit for Urinary Problems  We are sorry that you are not feeling well.  Here is how we plan to help!  Based on what you shared with me it looks like you most likely have a simple urinary tract infection.  A UTI (Urinary Tract Infection) is a bacterial infection of the bladder.  Most cases of urinary tract infections are simple to treat but a key part of your care is to encourage you to drink plenty of fluids and watch your symptoms carefully.  I have prescribed MacroBid 100 mg twice a day for 5 days.  Your symptoms should gradually improve. Call us if the burning in your urine worsens, you develop worsening fever, back pain or pelvic pain or if your symptoms do not resolve after completing the antibiotic.  Urinary tract infections can be prevented by drinking plenty of water to keep your body hydrated.  Also be sure when you wipe, wipe from front to back and don't hold it in!  If possible, empty your bladder every 4 hours.  HOME CARE Drink plenty of fluids Compete the full course of the antibiotics even if the symptoms resolve Remember, when you need to go.go. Holding in your urine can increase the likelihood of getting a UTI! GET HELP RIGHT AWAY IF: You cannot urinate You get a high fever Worsening back pain occurs You see blood in your urine You feel sick to your stomach or throw up You feel like you are going to pass out  MAKE SURE YOU  Understand these instructions. Will watch your condition. Will get help right away if you are not doing well or get worse.   Thank you for choosing an e-visit.  Your e-visit answers were reviewed by a board certified advanced clinical practitioner to complete your personal care plan. Depending upon the condition, your plan could have included both over the counter or prescription medications.  Please review your pharmacy choice. Make sure the pharmacy is open so you can pick up prescription now. If there is a problem, you may contact your  provider through [employer] and have the prescription routed to another pharmacy.  Your safety is important to [place]. If you have drug allergies check your prescription carefully.   For the next 24 hours you can use MyChart to ask questions about today's visit, request a non-urgent call back, or ask for a work or school excuse. You will get an email in the next two days asking about your experience. I hope that your e-visit has been valuable and will speed your recovery.  I have spent 5 minutes in review of e-visit questionnaire, review and updating patient chart, medical decision making and response to patient.   Reed Pandy, PA-C

## 2023-01-29 ENCOUNTER — Encounter: Payer: Self-pay | Admitting: Family Medicine

## 2023-01-29 ENCOUNTER — Ambulatory Visit: Payer: Commercial Managed Care - PPO | Admitting: Family Medicine

## 2023-01-29 VITALS — BP 133/80 | HR 79 | Ht 64.0 in | Wt 225.0 lb

## 2023-01-29 DIAGNOSIS — E559 Vitamin D deficiency, unspecified: Secondary | ICD-10-CM | POA: Diagnosis not present

## 2023-01-29 DIAGNOSIS — E038 Other specified hypothyroidism: Secondary | ICD-10-CM

## 2023-01-29 DIAGNOSIS — R5383 Other fatigue: Secondary | ICD-10-CM | POA: Diagnosis not present

## 2023-01-29 DIAGNOSIS — R252 Cramp and spasm: Secondary | ICD-10-CM

## 2023-01-29 DIAGNOSIS — E7849 Other hyperlipidemia: Secondary | ICD-10-CM | POA: Diagnosis not present

## 2023-01-29 DIAGNOSIS — J309 Allergic rhinitis, unspecified: Secondary | ICD-10-CM | POA: Diagnosis not present

## 2023-01-29 DIAGNOSIS — R7301 Impaired fasting glucose: Secondary | ICD-10-CM

## 2023-01-29 MED ORDER — MONTELUKAST SODIUM 10 MG PO TABS
10.0000 mg | ORAL_TABLET | Freq: Every day | ORAL | 3 refills | Status: DC
Start: 2023-01-29 — End: 2023-06-11

## 2023-01-29 NOTE — Assessment & Plan Note (Signed)
Resolved resolved on the B complex supplements specifically B6

## 2023-01-29 NOTE — Assessment & Plan Note (Signed)
The patient reports increased fatigue, noting that she feels tired frequently. She works two jobs, with her second job at [employer] being physically demanding. However, she denies experiencing fever, chills, or unintentional weight loss. I encouraged her to increase her physical activity, ensure adequate rest, stay hydrated, eat a well-balanced diet, and manage stress levels. Pending lab results will help provide further insight into her condition. Please follow up once the lab results are available, or if any new symptoms develop.

## 2023-01-29 NOTE — Patient Instructions (Addendum)
I appreciate the opportunity to provide care to you today!    Follow up:  3 months  Labs: please stop by the lab during the week to get your blood drawn (CBC, CMP, TSH, Lipid profile, HgA1c, Vit D)  Please pick your prescription at the pharmacy .   Please continue to a heart-healthy diet and increase your physical activities. Try to exercise for at least five days a week.    It was a pleasure to see you and I look forward to continuing to work together on your health and well-being. Please do not hesitate to call the office if you need care or have questions about your care.  In case of emergency, please visit the Emergency Department for urgent care, or contact our clinic at [phone] to schedule an appointment. We're here to help you!   Have a wonderful day and week. With Gratitude, Gilmore Laroche MSN, FNP-BC

## 2023-01-29 NOTE — Assessment & Plan Note (Signed)
The patient has a chronic condition of allergic rhinitis and reports minimal relief from her current over-the-counter regimen. We will initiate therapy with Singulair 10 mg to be taken at bedtime to help manage her symptoms. She is encouraged to monitor her symptoms and follow up if there are any changes or if further adjustments are needed.

## 2023-01-29 NOTE — Progress Notes (Signed)
Established Patient Office Visit  Subjective:  Patient ID: Monica Hernandez, female    DOB: [birth year]  Age: 47 y.o. MRN: [id number]  CC:  Chief Complaint  Patient presents with   Care Management    1 month f/u, pt reports muscle cramps have left after taking vitamin b6.     HPI Monica Hernandez is a 47 y.o. female  for f/u of sciatica of the right side with leg cramps. For the details of today's visit, please refer to the assessment and plan.     Past Medical History:  Diagnosis Date   Allergy    Anemia    Asthma    Dysplastic nevus 09/23/2022   Right shoulder post - Moderate   Eczema    GERD (gastroesophageal reflux disease)    History of kidney stones    Hypertension    Kidney stone    Mitral valve prolapse    was told as a kid that she had this but during ECHO for eval, ECHO did not indicate this , see 08-25-16 epic result    PONV (postoperative nausea and vomiting)    epidural with c section caused N/V    Pre-diabetes    Recurrent upper respiratory infection (URI)    Ulcer     Past Surgical History:  Procedure Laterality Date   ABDOMINAL HYSTERECTOMY     total   ADENOIDECTOMY     ADENOIDECTOMY, TONSILLECTOMY AND MYRINGOTOMY WITH TUBE PLACEMENT     CESAREAN SECTION     FRACTURE SURGERY     GASTRIC ROUX-EN-Y N/A 03/31/2017   Procedure: LAPAROSCOPIC ROUX-EN-Y GASTRIC BYPASS WITH UPPER ENDOSCOPY;  Surgeon: Kinsinger, De Blanch, MD;  Location: [facility];  Service: General;  Laterality: N/A;   HERNIA REPAIR     umbilical   LEG SURGERY Left    MULTIPLE TOOTH EXTRACTIONS Bilateral 2023   TONSILLECTOMY     TUBAL LIGATION      Family History  Problem Relation Age of Onset   Hypertension Mother    Stroke Mother    Thyroid cancer Mother    Stroke Father    Lung cancer Father    Brain cancer Father    Bone cancer Father    Hypertension Sister    Thyroid cancer Sister    Hypertension Brother    Heart disease Maternal Grandmother    Diabetes Maternal  Grandmother    Asthma Other    Cancer Other    Stomach cancer Neg Hx    Colon cancer Neg Hx     Social History   Socioeconomic History   Marital status: Married    Spouse name: Not on file   Number of children: 1   Years of education: Not on file   Highest education level: Some college, no degree  Occupational History    Employer: [employer]    Comment: heart care    Comment: [employer]  Tobacco Use   Smoking status: Former    Current packs/day: 0.00    Average packs/day: 0.5 packs/day for 15.0 years (7.5 ttl pk-yrs)    Types: Cigarettes    Start date: 45    Quit date: 2010    Years since quitting: 14.8   Smokeless tobacco: Never   Tobacco comments:    smoked since age 9 ; quit in 2010   Vaping Use   Vaping status: Never Used  Substance and Sexual Activity   Alcohol use: No   Drug use: No   Sexual  activity: Yes    Partners: Male    Birth control/protection: Surgical  Other Topics Concern   Not on file  Social History Narrative   Live with her spouse ans her son   Social Determinants of Health   Financial Resource Strain: Patient Declined (12/28/2022)   Overall Financial Resource Strain (CARDIA)    Difficulty of Paying Living Expenses: Patient declined  Food Insecurity: Patient Declined (12/28/2022)   Hunger Vital Sign    Worried About Running Out of Food in the Last Year: Patient declined    Ran Out of Food in the Last Year: Patient declined  Transportation Needs: No Transportation Needs (12/28/2022)   PRAPARE - Administrator, Civil Service (Medical): No    Lack of Transportation (Non-Medical): No  Physical Activity: Sufficiently Active (12/28/2022)   Exercise Vital Sign    Days of Exercise per Week: 5 days    Minutes of Exercise per Session: 40 min  Stress: Stress Concern Present (12/28/2022)   [employer] of Occupational Health - Occupational Stress Questionnaire    Feeling of Stress : To some extent  Social Connections: Socially  Isolated (12/28/2022)   Social Connection and Isolation Panel [NHANES]    Frequency of Communication with Friends and Family: Twice a week    Frequency of Social Gatherings with Friends and Family: Never    Attends Religious Services: Never    Database administrator or Organizations: No    Attends Engineer, structural: Not on file    Marital Status: Married  Catering manager Violence: Not on file    Outpatient Medications Prior to Visit  Medication Sig Dispense Refill   acetaminophen (TYLENOL) 500 MG tablet Take 1,000 mg by mouth every 6 (six) hours as needed for mild pain or moderate pain.     albuterol (VENTOLIN HFA) 108 (90 Base) MCG/ACT inhaler Inhale 2 puffs into the lungs every 6 (six) hours as needed for wheezing or shortness of breath. 8 g 0   Cholecalciferol (VITAMIN D3) 25 MCG (1000 UT) CAPS Take 1 capsule (1,000 Units total) by mouth daily. 60 capsule 3   doxycycline (VIBRA-TABS) 100 MG tablet Take 1 tablet (100 mg total) by mouth 2 (two) times daily. 20 tablet 0   EPINEPHrine 0.3 mg/0.3 mL IJ SOAJ injection Inject 0.3 mg into the muscle as needed for anaphylaxis. 2 each 1   famotidine (PEPCID) 40 MG tablet Take 1 tablet (40 mg total) by mouth 2 (two) times daily. 60 tablet 3   furosemide (LASIX) 20 MG tablet Take 20 mg daily as needed for swelling, weight gain 90 tablet 3   gabapentin (NEURONTIN) 300 MG capsule Take 1 capsule (300 mg total) by mouth at bedtime. 90 capsule 3   meclizine (ANTIVERT) 25 MG tablet Take 1 tablet (25 mg total) by mouth 3 (three) times daily as needed for dizziness. 30 tablet 0   methocarbamol (ROBAXIN) 500 MG tablet Take 1 tablet (500 mg total) by mouth every 8 (eight) hours as needed for muscle spasms (if severe pain in leg). 30 tablet 0   metoprolol succinate (TOPROL-XL) 25 MG 24 hr tablet Take 1 tablet (25 mg total) by mouth daily. 90 tablet 3   Multiple Vitamins-Minerals (BARIATRIC MULTIVITAMINS/IRON PO) Take 2 tablets by mouth 2 (two) times  daily. Chewable tablets     nitrofurantoin, macrocrystal-monohydrate, (MACROBID) 100 MG capsule Take 1 capsule (100 mg total) by mouth 2 (two) times daily for 5 days. 10 capsule 0   ondansetron (  ZOFRAN) 4 MG tablet Take 1 tablet (4 mg total) by mouth every 8 (eight) hours as needed for nausea or vomiting. 20 tablet 0   pantoprazole (PROTONIX) 40 MG tablet Take 1 tablet (40 mg total) by mouth 2 (two) times daily before a meal for 14 days. 28 tablet 0   No facility-administered medications prior to visit.    Allergies  Allergen Reactions   Bee Venom Anaphylaxis   Contrave [Naltrexone-Bupropion Hcl Er] Nausea And Vomiting    Pt states it causes bloody stool, bloody vomit, and heart palpitations   Erythromycin Nausea And Vomiting and Rash    Wheezing    Shellfish Allergy Anaphylaxis   Zithromax [Azithromycin] Nausea And Vomiting, Rash and Other (See Comments)   Belviq [Lorcaserin Hcl] Other (See Comments)    Causes changes in vision and respiratory issues   Fexofenadine-Pseudoephed Er Nausea And Vomiting   Liraglutide -Weight Management Other (See Comments)    Causes fainting (per pt, dose was toohigh and decreased gluc too much)   Qsymia [Phentermine-Topiramate] Other (See Comments)    Causes change in vision and respiratory issues    Levocetirizine Other (See Comments)    headache   Methylprednisolone Other (See Comments)    Low heart rate   Naltrexone Other (See Comments)    Bloody stools.    Fish Allergy Rash   Sulfa Antibiotics Rash    ROS Review of Systems  Constitutional:  Positive for fatigue. Negative for chills and fever.  Eyes:  Negative for visual disturbance.  Respiratory:  Negative for chest tightness and shortness of breath.   Neurological:  Negative for dizziness and headaches.      Objective:    Physical Exam HENT:     Head: Normocephalic.     Mouth/Throat:     Mouth: Mucous membranes are moist.  Cardiovascular:     Rate and Rhythm: Normal rate.      Heart sounds: Normal heart sounds.  Pulmonary:     Effort: Pulmonary effort is normal.     Breath sounds: Normal breath sounds.  Neurological:     Mental Status: She is alert.     BP 133/80   Pulse 79   Ht 5\' 4"  (1.626 m)   Wt 225 lb 0.6 oz (102.1 kg)   SpO2 97%   BMI 38.63 kg/m  Wt Readings from Last 3 Encounters:  01/29/23 225 lb 0.6 oz (102.1 kg)  12/29/22 215 lb 1.9 oz (97.6 kg)  12/13/22 210 lb (95.3 kg)    Lab Results  Component Value Date   TSH 2.420 06/06/2022   Lab Results  Component Value Date   WBC 7.5 12/13/2022   HGB 11.1 (L) 12/13/2022   HCT 35.1 (L) 12/13/2022   MCV 89.8 12/13/2022   PLT 237 12/13/2022   Lab Results  Component Value Date   NA 137 12/13/2022   K 3.8 12/13/2022   CO2 24 12/13/2022   GLUCOSE 104 (H) 12/13/2022   BUN 18 12/13/2022   CREATININE 0.79 12/13/2022   BILITOT 0.4 12/13/2022   ALKPHOS 73 12/13/2022   AST 26 12/13/2022   ALT 20 12/13/2022   PROT 6.7 12/13/2022   ALBUMIN 3.7 12/13/2022   CALCIUM 8.7 (L) 12/13/2022   ANIONGAP 11 12/13/2022   EGFR 88 06/06/2022   Lab Results  Component Value Date   CHOL 161 06/06/2022   Lab Results  Component Value Date   HDL 66 06/06/2022   Lab Results  Component Value Date   LDLCALC  85 06/06/2022   Lab Results  Component Value Date   TRIG 46 06/06/2022   Lab Results  Component Value Date   CHOLHDL 2.4 06/06/2022   Lab Results  Component Value Date   HGBA1C 5.8 (H) 06/06/2022      Assessment & Plan:  Other fatigue Assessment & Plan: The patient reports increased fatigue, noting that she feels tired frequently. She works two jobs, with her second job at [employer] being physically demanding. However, she denies experiencing fever, chills, or unintentional weight loss. I encouraged her to increase her physical activity, ensure adequate rest, stay hydrated, eat a well-balanced diet, and manage stress levels. Pending lab results will help provide further insight into her  condition. Please follow up once the lab results are available, or if any new symptoms develop.   Orders: -     VITAMIN D 25 Hydroxy (Vit-D Deficiency, Fractures) -     TSH + free T4 -     CMP14+EGFR -     CBC with Differential/Platelet -     Vitamin B6 -     Vitamin B12  Leg cramps Assessment & Plan: Resolved resolved on the B complex supplements specifically B6   Chronic allergic rhinitis Assessment & Plan: The patient has a chronic condition of allergic rhinitis and reports minimal relief from her current over-the-counter regimen. We will initiate therapy with Singulair 10 mg to be taken at bedtime to help manage her symptoms. She is encouraged to monitor her symptoms and follow up if there are any changes or if further adjustments are needed.    Orders: -     Montelukast Sodium; Take 1 tablet (10 mg total) by mouth at bedtime.  Dispense: 30 tablet; Refill: 3  IFG (impaired fasting glucose) -     Hemoglobin A1c  Vitamin D deficiency -     VITAMIN D 25 Hydroxy (Vit-D Deficiency, Fractures)  TSH (thyroid-stimulating hormone deficiency) -     TSH + free T4  Other hyperlipidemia -     Lipid panel -     CMP14+EGFR -     CBC with Differential/Platelet  Note: This chart has been completed using Engineer, civil (consulting) software, and while attempts have been made to ensure accuracy, certain words and phrases may not be transcribed as intended.    Follow-up: No follow-ups on file.   Gilmore Laroche, FNP

## 2023-01-30 DIAGNOSIS — R7301 Impaired fasting glucose: Secondary | ICD-10-CM | POA: Diagnosis not present

## 2023-01-30 DIAGNOSIS — E539 Vitamin B deficiency, unspecified: Secondary | ICD-10-CM | POA: Diagnosis not present

## 2023-01-30 DIAGNOSIS — E7849 Other hyperlipidemia: Secondary | ICD-10-CM | POA: Diagnosis not present

## 2023-01-30 DIAGNOSIS — E038 Other specified hypothyroidism: Secondary | ICD-10-CM | POA: Diagnosis not present

## 2023-01-30 DIAGNOSIS — E559 Vitamin D deficiency, unspecified: Secondary | ICD-10-CM | POA: Diagnosis not present

## 2023-02-02 ENCOUNTER — Other Ambulatory Visit (HOSPITAL_COMMUNITY): Payer: Self-pay

## 2023-02-02 ENCOUNTER — Other Ambulatory Visit: Payer: Self-pay | Admitting: Family Medicine

## 2023-02-02 ENCOUNTER — Encounter (HOSPITAL_COMMUNITY): Payer: Self-pay

## 2023-02-02 ENCOUNTER — Encounter: Payer: Self-pay | Admitting: Family Medicine

## 2023-02-02 DIAGNOSIS — R7303 Prediabetes: Secondary | ICD-10-CM

## 2023-02-02 MED ORDER — METFORMIN HCL 500 MG PO TABS
500.0000 mg | ORAL_TABLET | Freq: Every day | ORAL | 3 refills | Status: DC
Start: 2023-02-02 — End: 2023-10-28
  Filled 2023-02-02 – 2023-02-03 (×2): qty 90, 90d supply, fill #0
  Filled 2023-02-16 – 2023-04-22 (×2): qty 90, 90d supply, fill #1

## 2023-02-03 ENCOUNTER — Other Ambulatory Visit: Payer: Self-pay

## 2023-02-03 ENCOUNTER — Other Ambulatory Visit (HOSPITAL_COMMUNITY): Payer: Self-pay

## 2023-02-05 LAB — CBC WITH DIFFERENTIAL/PLATELET
Basophils Absolute: 0.1 10*3/uL (ref 0.0–0.2)
Basos: 1 %
EOS (ABSOLUTE): 0.2 10*3/uL (ref 0.0–0.4)
Eos: 3 %
Hematocrit: 39.2 % (ref 34.0–46.6)
Hemoglobin: 12.3 g/dL (ref 11.1–15.9)
Immature Grans (Abs): 0 10*3/uL (ref 0.0–0.1)
Immature Granulocytes: 0 %
Lymphocytes Absolute: 2.8 10*3/uL (ref 0.7–3.1)
Lymphs: 42 %
MCH: 28.3 pg (ref 26.6–33.0)
MCHC: 31.4 g/dL — ABNORMAL LOW (ref 31.5–35.7)
MCV: 90 fL (ref 79–97)
Monocytes Absolute: 0.5 10*3/uL (ref 0.1–0.9)
Monocytes: 7 %
Neutrophils Absolute: 3 10*3/uL (ref 1.4–7.0)
Neutrophils: 47 %
Platelets: 250 10*3/uL (ref 150–450)
RBC: 4.34 x10E6/uL (ref 3.77–5.28)
RDW: 13.4 % (ref 11.7–15.4)
WBC: 6.5 10*3/uL (ref 3.4–10.8)

## 2023-02-05 LAB — LIPID PANEL
Chol/HDL Ratio: 2.4 ratio (ref 0.0–4.4)
Cholesterol, Total: 185 mg/dL (ref 100–199)
HDL: 77 mg/dL (ref >39)
LDL Chol Calc (NIH): 98 mg/dL (ref 0–99)
Triglycerides: 54 mg/dL (ref 0–149)
VLDL Cholesterol Cal: 10 mg/dL (ref 5–40)

## 2023-02-05 LAB — CMP14+EGFR
ALT: 26 [IU]/L (ref 0–32)
AST: 35 IU/L (ref 0–40)
Albumin: 4.2 g/dL (ref 3.9–4.9)
Alkaline Phosphatase: 86 IU/L (ref 44–121)
BUN/Creatinine Ratio: 16 (ref 9–23)
BUN: 15 mg/dL (ref 6–24)
Bilirubin Total: 0.4 mg/dL (ref 0.0–1.2)
CO2: 25 mmol/L (ref 20–29)
Calcium: 9.5 mg/dL (ref 8.7–10.2)
Chloride: 104 mmol/L (ref 96–106)
Creatinine, Ser: 0.93 mg/dL (ref 0.57–1.00)
Globulin, Total: 2.5 g/dL (ref 1.5–4.5)
Glucose: 84 mg/dL (ref 70–99)
Potassium: 4.8 mmol/L (ref 3.5–5.2)
Sodium: 142 mmol/L (ref 134–144)
Total Protein: 6.7 g/dL (ref 6.0–8.5)
eGFR: 75 mL/min/{1.73_m2} (ref >59)

## 2023-02-05 LAB — HEMOGLOBIN A1C
Est. average glucose Bld gHb Est-mCnc: 126 mg/dL
Hgb A1c MFr Bld: 6 % — ABNORMAL HIGH (ref 4.8–5.6)

## 2023-02-05 LAB — VITAMIN B6: Vitamin B6: 72.3 ug/L — ABNORMAL HIGH (ref 3.4–65.2)

## 2023-02-05 LAB — TSH+FREE T4
Free T4: 1.12 ng/dL (ref 0.82–1.77)
TSH: 3.09 u[IU]/mL (ref 0.450–4.500)

## 2023-02-05 LAB — VITAMIN D 25 HYDROXY (VIT D DEFICIENCY, FRACTURES): Vit D, 25-Hydroxy: 35.6 ng/mL (ref 30.0–100.0)

## 2023-02-06 ENCOUNTER — Other Ambulatory Visit: Payer: Self-pay

## 2023-02-16 ENCOUNTER — Other Ambulatory Visit (HOSPITAL_COMMUNITY): Payer: Self-pay

## 2023-02-16 ENCOUNTER — Other Ambulatory Visit: Payer: Self-pay

## 2023-04-22 ENCOUNTER — Other Ambulatory Visit (HOSPITAL_COMMUNITY): Payer: Self-pay

## 2023-05-12 ENCOUNTER — Ambulatory Visit: Payer: Commercial Managed Care - PPO | Admitting: Family Medicine

## 2023-05-28 ENCOUNTER — Ambulatory Visit (INDEPENDENT_AMBULATORY_CARE_PROVIDER_SITE_OTHER): Payer: Commercial Managed Care - PPO | Admitting: Family Medicine

## 2023-05-28 ENCOUNTER — Encounter: Payer: Self-pay | Admitting: Family Medicine

## 2023-05-28 VITALS — BP 121/81 | HR 71 | Ht 64.0 in | Wt 229.8 lb

## 2023-05-28 DIAGNOSIS — E7849 Other hyperlipidemia: Secondary | ICD-10-CM

## 2023-05-28 DIAGNOSIS — E559 Vitamin D deficiency, unspecified: Secondary | ICD-10-CM | POA: Diagnosis not present

## 2023-05-28 DIAGNOSIS — R7301 Impaired fasting glucose: Secondary | ICD-10-CM | POA: Diagnosis not present

## 2023-05-28 DIAGNOSIS — E038 Other specified hypothyroidism: Secondary | ICD-10-CM

## 2023-05-28 DIAGNOSIS — Z0001 Encounter for general adult medical examination with abnormal findings: Secondary | ICD-10-CM | POA: Diagnosis not present

## 2023-05-28 NOTE — Patient Instructions (Addendum)
I appreciate the opportunity to provide care to you today!    Follow up:  4 months  Labs: please stop by the lab today to get your blood drawn (CBC, CMP, TSH, Lipid profile, HgA1c, Vit D)   Please continue to a heart-healthy diet and increase your physical activities. Try to exercise for at least five days a week.    It was a pleasure to see you and I look forward to continuing to work together on your health and well-being. Please do not hesitate to call the office if you need care or have questions about your care.  In case of emergency, please visit the Emergency Department for urgent care, or contact our clinic at [phone] to schedule an appointment. We're here to help you!   Have a wonderful day and week. With Gratitude, Gilmore Laroche MSN, FNP-BC

## 2023-05-28 NOTE — Assessment & Plan Note (Signed)

## 2023-05-28 NOTE — Progress Notes (Signed)
 Complete physical exam  Patient: Monica Hernandez   DOB: 1976-01-16   48 y.o. Female  MRN: [id number]  Subjective:    Chief Complaint  Patient presents with   Care Management    Quinlan Mcfall is a 48 y.o. female who presents today for a complete physical exam. She reports consuming a general diet. The patient has a physically strenuous job, but has no regular exercise apart from work.  She generally feels fairly well. She reports sleeping well. She does not have additional problems to discuss today.    Most recent fall risk assessment:    05/28/2023    8:30 AM  Fall Risk   Falls in the past year? 0  Number falls in past yr: 0  Injury with Fall? 0  Risk for fall due to : No Fall Risks  Follow up Falls evaluation completed;Education provided;Falls prevention discussed     Most recent depression screenings:    05/28/2023    8:30 AM 05/19/2022    1:40 PM  PHQ 2/9 Scores  PHQ - 2 Score 2 0  PHQ- 9 Score 7 1    Vision:Within last year  Patient Active Problem List   Diagnosis Date Noted   Fatigue 01/29/2023   Leg cramps 12/29/2022   Multiple nevi 05/19/2022   Onychomycosis of toenail 02/15/2022   Need for influenza vaccination 11/15/2021   Immunization due 11/15/2021   COVID-19 virus infection 10/09/2021   History of gastric bypass 08/16/2021   Joint swelling 04/01/2021   Prediabetes 04/01/2021   Abnormal mammogram 08/07/2020   Encounter for annual general medical examination with abnormal findings in adult 07/03/2020   Screening due 07/03/2020   Palpitations 07/03/2020   Migraine 07/03/2020   Asthma 07/03/2020   Multiple allergies 07/03/2020   Arthritis 07/03/2020   Status post hysterectomy 07/03/2020   BPPV (benign paroxysmal positional vertigo) 02/15/2018   Chronic allergic rhinitis 02/15/2018   Morbid obesity (HCC) 03/31/2017   Past Medical History:  Diagnosis Date   Allergy    Anemia    Asthma    Dysplastic nevus 09/23/2022   Right shoulder post  - Moderate   Eczema    GERD (gastroesophageal reflux disease)    History of kidney stones    Hypertension    Kidney stone    Mitral valve prolapse    was told as a kid that she had this but during ECHO for eval, ECHO did not indicate this , see 08-25-16 epic result    PONV (postoperative nausea and vomiting)    epidural with c section caused N/V    Pre-diabetes    Recurrent upper respiratory infection (URI)    Ulcer    Past Surgical History:  Procedure Laterality Date   ABDOMINAL HYSTERECTOMY     total   ADENOIDECTOMY     ADENOIDECTOMY, TONSILLECTOMY AND MYRINGOTOMY WITH TUBE PLACEMENT     CESAREAN SECTION     FRACTURE SURGERY     GASTRIC ROUX-EN-Y N/A 03/31/2017   Procedure: LAPAROSCOPIC ROUX-EN-Y GASTRIC BYPASS WITH UPPER ENDOSCOPY;  Surgeon: Sheliah Hatch, De Blanch, MD;  Location: [facility];  Service: General;  Laterality: N/A;   HERNIA REPAIR     umbilical   LEG SURGERY Left    MULTIPLE TOOTH EXTRACTIONS Bilateral 2023   TONSILLECTOMY     TUBAL LIGATION     Social History   Tobacco Use   Smoking status: Former    Current packs/day: 0.00    Average packs/day: 0.5 packs/day for 15.0  years (7.5 ttl pk-yrs)    Types: Cigarettes    Start date: 9    Quit date: 2010    Years since quitting: 15.2   Smokeless tobacco: Never   Tobacco comments:    smoked since age 67 ; quit in 2010   Vaping Use   Vaping status: Never Used  Substance Use Topics   Alcohol use: No   Drug use: No   Social History   Socioeconomic History   Marital status: Married    Spouse name: Not on file   Number of children: 1   Years of education: Not on file   Highest education level: Some college, no degree  Occupational History    Employer: [employer]    Comment: heart care    Comment: [employer]  Tobacco Use   Smoking status: Former    Current packs/day: 0.00    Average packs/day: 0.5 packs/day for 15.0 years (7.5 ttl pk-yrs)    Types: Cigarettes    Start date: 57    Quit date: 2010     Years since quitting: 15.2   Smokeless tobacco: Never   Tobacco comments:    smoked since age 35 ; quit in 2010   Vaping Use   Vaping status: Never Used  Substance and Sexual Activity   Alcohol use: No   Drug use: No   Sexual activity: Yes    Partners: Male    Birth control/protection: Surgical  Other Topics Concern   Not on file  Social History Narrative   Live with her spouse ans her son   Social Drivers of Health   Financial Resource Strain: Patient Declined (12/28/2022)   Overall Financial Resource Strain (CARDIA)    Difficulty of Paying Living Expenses: Patient declined  Food Insecurity: Patient Declined (12/28/2022)   Hunger Vital Sign    Worried About Running Out of Food in the Last Year: Patient declined    Ran Out of Food in the Last Year: Patient declined  Transportation Needs: No Transportation Needs (12/28/2022)   PRAPARE - Administrator, Civil Service (Medical): No    Lack of Transportation (Non-Medical): No  Physical Activity: Sufficiently Active (12/28/2022)   Exercise Vital Sign    Days of Exercise per Week: 5 days    Minutes of Exercise per Session: 40 min  Stress: Stress Concern Present (12/28/2022)   [employer] of Occupational Health - Occupational Stress Questionnaire    Feeling of Stress : To some extent  Social Connections: Socially Isolated (12/28/2022)   Social Connection and Isolation Panel [NHANES]    Frequency of Communication with Friends and Family: Twice a week    Frequency of Social Gatherings with Friends and Family: Never    Attends Religious Services: Never    Diplomatic Services operational officer: No    Attends Engineer, structural: Not on file    Marital Status: Married  Catering manager Violence: Not on file   Family Status  Relation Name Status   Mother  Deceased   Father  Deceased   Sister  Alive   Brother  (Not Specified)   MGM  Deceased   MGF  Deceased   PGM  Deceased   PGF  Deceased    Other  (Not Specified)   Neg Hx  (Not Specified)  No partnership data on file   Family History  Problem Relation Age of Onset   Hypertension Mother    Stroke Mother    Thyroid  cancer Mother    Stroke Father    Lung cancer Father    Brain cancer Father    Bone cancer Father    Hypertension Sister    Thyroid cancer Sister    Hypertension Brother    Heart disease Maternal Grandmother    Diabetes Maternal Grandmother    Asthma Other    Cancer Other    Stomach cancer Neg Hx    Colon cancer Neg Hx    Allergies  Allergen Reactions   Bee Venom Anaphylaxis   Contrave [Naltrexone-Bupropion Hcl Er] Nausea And Vomiting    Pt states it causes bloody stool, bloody vomit, and heart palpitations   Erythromycin Nausea And Vomiting and Rash    Wheezing    Shellfish Allergy Anaphylaxis   Zithromax [Azithromycin] Nausea And Vomiting, Rash and Other (See Comments)   Belviq [Lorcaserin Hcl] Other (See Comments)    Causes changes in vision and respiratory issues   Fexofenadine-Pseudoephed Er Nausea And Vomiting   Liraglutide -Weight Management Other (See Comments)    Causes fainting (per pt, dose was toohigh and decreased gluc too much)   Qsymia [Phentermine-Topiramate] Other (See Comments)    Causes change in vision and respiratory issues    Levocetirizine Other (See Comments)    headache   Methylprednisolone Other (See Comments)    Low heart rate   Naltrexone Other (See Comments)    Bloody stools.    Fish Allergy Rash   Sulfa Antibiotics Rash      Patient Care Team: Gilmore Laroche, FNP as PCP - General (Family Medicine) Wendall Stade, MD as PCP - Cardiology (Cardiology)   Outpatient Medications Prior to Visit  Medication Sig   Cholecalciferol (VITAMIN D3) 25 MCG (1000 UT) CAPS Take 1 capsule (1,000 Units total) by mouth daily.   famotidine (PEPCID) 40 MG tablet Take 1 tablet (40 mg total) by mouth 2 (two) times daily.   metFORMIN (GLUCOPHAGE) 500 MG tablet Take 1 tablet (500  mg total) by mouth daily.   metoprolol succinate (TOPROL-XL) 25 MG 24 hr tablet Take 1 tablet (25 mg total) by mouth daily.   Multiple Vitamins-Minerals (BARIATRIC MULTIVITAMINS/IRON PO) Take 2 tablets by mouth 2 (two) times daily. Chewable tablets   acetaminophen (TYLENOL) 500 MG tablet Take 1,000 mg by mouth every 6 (six) hours as needed for mild pain or moderate pain. (Patient not taking: Reported on 05/28/2023)   albuterol (VENTOLIN HFA) 108 (90 Base) MCG/ACT inhaler Inhale 2 puffs into the lungs every 6 (six) hours as needed for wheezing or shortness of breath. (Patient not taking: Reported on 05/28/2023)   doxycycline (VIBRA-TABS) 100 MG tablet Take 1 tablet (100 mg total) by mouth 2 (two) times daily. (Patient not taking: Reported on 05/28/2023)   EPINEPHrine 0.3 mg/0.3 mL IJ SOAJ injection Inject 0.3 mg into the muscle as needed for anaphylaxis. (Patient not taking: Reported on 05/28/2023)   furosemide (LASIX) 20 MG tablet Take 20 mg daily as needed for swelling, weight gain (Patient not taking: Reported on 05/28/2023)   gabapentin (NEURONTIN) 300 MG capsule Take 1 capsule (300 mg total) by mouth at bedtime. (Patient not taking: Reported on 05/28/2023)   meclizine (ANTIVERT) 25 MG tablet Take 1 tablet (25 mg total) by mouth 3 (three) times daily as needed for dizziness. (Patient not taking: Reported on 05/28/2023)   methocarbamol (ROBAXIN) 500 MG tablet Take 1 tablet (500 mg total) by mouth every 8 (eight) hours as needed for muscle spasms (if severe pain in leg). (Patient  not taking: Reported on 05/28/2023)   montelukast (SINGULAIR) 10 MG tablet Take 1 tablet (10 mg total) by mouth at bedtime. (Patient not taking: Reported on 05/28/2023)   ondansetron (ZOFRAN) 4 MG tablet Take 1 tablet (4 mg total) by mouth every 8 (eight) hours as needed for nausea or vomiting. (Patient not taking: Reported on 05/28/2023)   pantoprazole (PROTONIX) 40 MG tablet Take 1 tablet (40 mg total) by mouth 2 (two) times daily  before a meal for 14 days.   No facility-administered medications prior to visit.    Review of Systems  Constitutional:  Negative for chills, fever and malaise/fatigue.  HENT:  Negative for congestion and sinus pain.   Eyes:  Negative for pain, discharge and redness.  Respiratory:  Negative for cough, sputum production and shortness of breath.   Cardiovascular:  Negative for chest pain, palpitations, claudication and leg swelling.  Gastrointestinal:  Negative for diarrhea, heartburn and nausea.  Genitourinary:  Negative for flank pain and frequency.  Musculoskeletal:  Negative for back pain and joint pain.  Skin:  Negative for itching.  Neurological:  Negative for dizziness, seizures and headaches.  Endo/Heme/Allergies:  Negative for environmental allergies.  Psychiatric/Behavioral:  Negative for memory loss. The patient does not have insomnia.        Objective:    BP 121/81 (BP Location: Right Arm, Patient Position: Sitting, Cuff Size: Large)   Pulse 71   Ht 5\' 4"  (1.626 m)   Wt 229 lb 12.8 oz (104.2 kg)   SpO2 98%   BMI 39.45 kg/m  BP Readings from Last 3 Encounters:  05/28/23 121/81  01/29/23 133/80  12/29/22 118/80   Wt Readings from Last 3 Encounters:  05/28/23 229 lb 12.8 oz (104.2 kg)  01/29/23 225 lb 0.6 oz (102.1 kg)  12/29/22 215 lb 1.9 oz (97.6 kg)      Physical Exam HENT:     Head: Normocephalic.     Left Ear: External ear normal.     Mouth/Throat:     Mouth: Mucous membranes are moist.  Eyes:     Extraocular Movements: Extraocular movements intact.     Pupils: Pupils are equal, round, and reactive to light.  Cardiovascular:     Rate and Rhythm: Normal rate and regular rhythm.     Heart sounds: No murmur heard. Pulmonary:     Effort: Pulmonary effort is normal.     Breath sounds: Normal breath sounds.  Abdominal:     Palpations: Abdomen is soft.     Tenderness: There is no right CVA tenderness or left CVA tenderness.  Musculoskeletal:     Right  lower leg: No edema.     Left lower leg: No edema.  Skin:    Findings: No lesion or rash.  Neurological:     Mental Status: She is alert and oriented to person, place, and time.     GCS: GCS eye subscore is 4. GCS verbal subscore is 5. GCS motor subscore is 6.     Cranial Nerves: No facial asymmetry.     Sensory: No sensory deficit.     Motor: No weakness.     Coordination: Coordination normal. Finger-Nose-Finger Test normal.     Gait: Gait normal.  Psychiatric:        Judgment: Judgment normal.     No results found for any visits on 05/28/23. Last CBC Lab Results  Component Value Date   WBC 6.5 01/30/2023   HGB 12.3 01/30/2023   HCT 39.2 01/30/2023  MCV 90 01/30/2023   MCH 28.3 01/30/2023   RDW 13.4 01/30/2023   PLT 250 01/30/2023   Last metabolic panel Lab Results  Component Value Date   GLUCOSE 84 01/30/2023   NA 142 01/30/2023   K 4.8 01/30/2023   CL 104 01/30/2023   CO2 25 01/30/2023   BUN 15 01/30/2023   CREATININE 0.93 01/30/2023   EGFR 75 01/30/2023   CALCIUM 9.5 01/30/2023   PHOS 3.9 04/10/2017   PROT 6.7 01/30/2023   ALBUMIN 4.2 01/30/2023   LABGLOB 2.5 01/30/2023   AGRATIO 1.9 06/06/2022   BILITOT 0.4 01/30/2023   ALKPHOS 86 01/30/2023   AST 35 01/30/2023   ALT 26 01/30/2023   ANIONGAP 11 12/13/2022   Last lipids Lab Results  Component Value Date   CHOL 185 01/30/2023   HDL 77 01/30/2023   LDLCALC 98 01/30/2023   TRIG 54 01/30/2023   CHOLHDL 2.4 01/30/2023   Last hemoglobin A1c Lab Results  Component Value Date   HGBA1C 6.0 (H) 01/30/2023   Last thyroid functions Lab Results  Component Value Date   TSH 3.090 01/30/2023   Last vitamin D Lab Results  Component Value Date   VD25OH 35.6 01/30/2023   Last vitamin B12 and Folate Lab Results  Component Value Date   VITAMINB12 631 08/16/2021   FOLATE >20.0 08/16/2021        Assessment & Plan:    Routine Health Maintenance and Physical Exam  Immunization History  Administered  Date(s) Administered   Influenza,inj,Quad PF,6+ Mos 11/28/2020, 11/15/2021   Influenza-Unspecified 12/01/2016   PFIZER(Purple Top)SARS-COV-2 Vaccination 10/09/2019, 11/09/2019   Tdap 10/10/2013    Health Maintenance  Topic Date Due   Pneumococcal Vaccine 36-52 Years old (1 of 2 - PCV) Never done   COVID-19 Vaccine (3 - Pfizer risk series) 12/07/2019   DTaP/Tdap/Td (2 - Td or Tdap) 10/11/2023   Fecal DNA (Cologuard)  06/05/2025   INFLUENZA VACCINE  Completed   Hepatitis C Screening  Completed   HIV Screening  Completed   HPV VACCINES  Aged Out    Discussed health benefits of physical activity, and encouraged her to engage in regular exercise appropriate for her age and condition.  Encounter for annual general medical examination with abnormal findings in adult Assessment & Plan: Physical exam as documented Discussed heart-healthy diet  Encouraged to Exercise: If you are able: 30 -60 minutes a day ,4 days a week, or 150 minutes a week. The longer the better. Combine stretch, strength, and aerobic activities Encourage to eat whole Food, Plant Predominant Nutrition is highly recommended: Eat Plenty of vegetables, Mushrooms, fruits, Legumes, Whole Grains, Nuts, seeds in lieu of processed meats, processed snacks/pastries red meat, poultry, eggs.  Will f/u in 1 year for CPE      IFG (impaired fasting glucose) -     Hemoglobin A1c  Vitamin D deficiency -     VITAMIN D 25 Hydroxy (Vit-D Deficiency, Fractures)  TSH (thyroid-stimulating hormone deficiency) -     TSH + free T4  Other hyperlipidemia -     Lipid panel -     CMP14+EGFR -     CBC with Differential/Platelet   Note: This chart has been completed using Engineer, civil (consulting) software, and while attempts have been made to ensure accuracy, certain words and phrases may not be transcribed as intended.   No follow-ups on file.     Gilmore Laroche, FNP

## 2023-05-29 LAB — LIPID PANEL
Chol/HDL Ratio: 2.4 ratio (ref 0.0–4.4)
Cholesterol, Total: 180 mg/dL (ref 100–199)
HDL: 74 mg/dL (ref >39)
LDL Chol Calc (NIH): 95 mg/dL (ref 0–99)
Triglycerides: 59 mg/dL (ref 0–149)
VLDL Cholesterol Cal: 11 mg/dL (ref 5–40)

## 2023-05-29 LAB — CMP14+EGFR
ALT: 19 IU/L (ref 0–32)
AST: 27 IU/L (ref 0–40)
Albumin: 4.4 g/dL (ref 3.9–4.9)
Alkaline Phosphatase: 81 IU/L (ref 44–121)
BUN/Creatinine Ratio: 9 (ref 9–23)
BUN: 8 mg/dL (ref 6–24)
Bilirubin Total: 0.4 mg/dL (ref 0.0–1.2)
CO2: 25 mmol/L (ref 20–29)
Calcium: 9.5 mg/dL (ref 8.7–10.2)
Chloride: 102 mmol/L (ref 96–106)
Creatinine, Ser: 0.85 mg/dL (ref 0.57–1.00)
Globulin, Total: 2.4 g/dL (ref 1.5–4.5)
Glucose: 86 mg/dL (ref 70–99)
Potassium: 5.1 mmol/L (ref 3.5–5.2)
Sodium: 140 mmol/L (ref 134–144)
Total Protein: 6.8 g/dL (ref 6.0–8.5)
eGFR: 84 mL/min/{1.73_m2} (ref >59)

## 2023-05-29 LAB — CBC WITH DIFFERENTIAL/PLATELET
Basophils Absolute: 0.1 10*3/uL (ref 0.0–0.2)
Basos: 1 %
EOS (ABSOLUTE): 0.2 10*3/uL (ref 0.0–0.4)
Eos: 3 %
Hematocrit: 37.4 % (ref 34.0–46.6)
Hemoglobin: 12.3 g/dL (ref 11.1–15.9)
Immature Grans (Abs): 0 10*3/uL (ref 0.0–0.1)
Immature Granulocytes: 0 %
Lymphocytes Absolute: 2.7 10*3/uL (ref 0.7–3.1)
Lymphs: 40 %
MCH: 29.1 pg (ref 26.6–33.0)
MCHC: 32.9 g/dL (ref 31.5–35.7)
MCV: 89 fL (ref 79–97)
Monocytes Absolute: 0.5 10*3/uL (ref 0.1–0.9)
Monocytes: 7 %
Neutrophils Absolute: 3.3 10*3/uL (ref 1.4–7.0)
Neutrophils: 49 %
Platelets: 252 10*3/uL (ref 150–450)
RBC: 4.22 x10E6/uL (ref 3.77–5.28)
RDW: 12.8 % (ref 11.7–15.4)
WBC: 6.7 10*3/uL (ref 3.4–10.8)

## 2023-05-29 LAB — VITAMIN D 25 HYDROXY (VIT D DEFICIENCY, FRACTURES): Vit D, 25-Hydroxy: 42 ng/mL (ref 30.0–100.0)

## 2023-05-29 LAB — TSH+FREE T4
Free T4: 1.15 ng/dL (ref 0.82–1.77)
TSH: 2.69 u[IU]/mL (ref 0.450–4.500)

## 2023-05-29 LAB — HEMOGLOBIN A1C
Est. average glucose Bld gHb Est-mCnc: 123 mg/dL
Hgb A1c MFr Bld: 5.9 % — ABNORMAL HIGH (ref 4.8–5.6)

## 2023-05-30 ENCOUNTER — Encounter: Payer: Self-pay | Admitting: Family Medicine

## 2023-06-06 ENCOUNTER — Encounter: Payer: Self-pay | Admitting: Physician Assistant

## 2023-06-06 ENCOUNTER — Telehealth: Admitting: Physician Assistant

## 2023-06-06 DIAGNOSIS — B9689 Other specified bacterial agents as the cause of diseases classified elsewhere: Secondary | ICD-10-CM

## 2023-06-06 DIAGNOSIS — J208 Acute bronchitis due to other specified organisms: Secondary | ICD-10-CM | POA: Diagnosis not present

## 2023-06-06 MED ORDER — BENZONATATE 100 MG PO CAPS
ORAL_CAPSULE | ORAL | 0 refills | Status: DC
Start: 2023-06-06 — End: 2023-10-28

## 2023-06-06 MED ORDER — DOXYCYCLINE HYCLATE 100 MG PO CAPS: 100.0000 mg | ORAL_CAPSULE | Freq: Two times a day (BID) | ORAL | 0 refills | Status: DC

## 2023-06-06 NOTE — Progress Notes (Signed)

## 2023-06-09 ENCOUNTER — Ambulatory Visit: Payer: Self-pay

## 2023-06-09 NOTE — Telephone Encounter (Signed)
 Copied from CRM [id number]. Topic: Clinical - Red Word Triage >> Jun 09, 2023  8:49 AM Elle L wrote: Red Word that prompted transfer to Nurse Triage: The patient had a MyChart visit on 3/29 and was diagnosed with bronchitis. However, she has a worsening cough and discolored mucus and is still running a low grade fever with the medications benzonatate (TESSALON) 100 MG capsule, doxycycline (VIBRAMYCIN) 100 MG capsule, and Tylenol.  Chief Complaint: cough with yellow green sputum; fever Symptoms: see above Frequency: constant; was recently diagnosed with bronchitis Pertinent Negatives: Patient denies cp, sob Disposition: [] ED /[] Urgent Care (no appt availability in office) / [x] Appointment(In office/virtual)/ []  [place] [facility]/ [] Home Care/ [] Refused Recommended Disposition /[] [facility]/ []  Follow-up with PCP Additional Notes: per protocol apt made; care advice given, denies questions; instructed to go to ER if becomes worse.   Reason for Disposition  Coughing up rusty-colored (reddish-brown) sputum  Answer Assessment - Initial Assessment Questions 1. ONSET: "When did the cough begin?"      Friday evening 2. SEVERITY: "How bad is the cough today?"      moderate 3. SPUTUM: "Describe the color of your sputum" (none, dry cough; clear, white, yellow, green)     Green yellow color, bitter taste 4. HEMOPTYSIS: "Are you coughing up any blood?" If so ask: "How much?" (flecks, streaks, tablespoons, etc.)     denies 5. DIFFICULTY BREATHING: "Are you having difficulty breathing?" If Yes, ask: "How bad is it?" (e.g., mild, moderate, severe)    - MILD: No SOB at rest, mild SOB with walking, speaks normally in sentences, can lie down, no retractions, pulse < 100.    - MODERATE: SOB at rest, SOB with minimal exertion and prefers to sit, cannot lie down flat, speaks in phrases, mild retractions, audible wheezing, pulse 100-120.    - SEVERE: Very SOB at rest, speaks in single words,  struggling to breathe, sitting hunched forward, retractions, pulse > 120      Denies, but states wheezing a little 6. FEVER: "Do you have a fever?" If Yes, ask: "What is your temperature, how was it measured, and when did it start?"     100.2 7. CARDIAC HISTORY: "Do you have any history of heart disease?" (e.g., heart attack, congestive heart failure)      denies 8. LUNG HISTORY: "Do you have any history of lung disease?"  (e.g., pulmonary embolus, asthma, emphysema)     asthma 9. PE RISK FACTORS: "Do you have a history of blood clots?" (or: recent major surgery, recent prolonged travel, bedridden)     denies 10. OTHER SYMPTOMS: "Do you have any other symptoms?" (e.g., runny nose, wheezing, chest pain)       Wheezing, runny nose 11. PREGNANCY: "Is there any chance you are pregnant?" "When was your last menstrual period?"       na 12. TRAVEL: "Have you traveled out of the country in the last month?" (e.g., travel history, exposures)       Denies.  Protocols used: Cough - Acute Productive-A-AH

## 2023-06-11 ENCOUNTER — Telehealth: Payer: Self-pay | Admitting: Internal Medicine

## 2023-06-11 ENCOUNTER — Encounter: Payer: Self-pay | Admitting: Internal Medicine

## 2023-06-11 VITALS — Ht 64.0 in | Wt 225.0 lb

## 2023-06-11 DIAGNOSIS — J209 Acute bronchitis, unspecified: Secondary | ICD-10-CM

## 2023-06-11 DIAGNOSIS — R112 Nausea with vomiting, unspecified: Secondary | ICD-10-CM | POA: Diagnosis not present

## 2023-06-11 MED ORDER — PREDNISONE 20 MG PO TABS
ORAL_TABLET | ORAL | 0 refills | Status: AC
Start: 2023-06-11 — End: 2023-06-18

## 2023-06-11 MED ORDER — ONDANSETRON HCL 4 MG PO TABS
4.0000 mg | ORAL_TABLET | Freq: Three times a day (TID) | ORAL | 0 refills | Status: DC | PRN
Start: 2023-06-11 — End: 2023-10-28

## 2023-06-11 MED ORDER — ALBUTEROL SULFATE HFA 108 (90 BASE) MCG/ACT IN AERS
2.0000 | INHALATION_SPRAY | Freq: Four times a day (QID) | RESPIRATORY_TRACT | 1 refills | Status: AC | PRN
Start: 2023-06-11 — End: ?

## 2023-06-11 NOTE — Patient Instructions (Signed)
 Please continue taking doxycycline as prescribed.  Please take prednisone as prescribed.  Please take Zofran as needed for nausea.  Please use albuterol inhaler as needed for shortness of breath or wheezing.

## 2023-06-11 NOTE — Progress Notes (Signed)
 Virtual Visit via Video Note   Because of Monica Hernandez's co-morbid illnesses, she is at least at moderate risk for complications without adequate follow up.  This format is felt to be most appropriate for this patient at this time.  All issues noted in this document were discussed and addressed.  A limited physical exam was performed with this format.      Evaluation Performed:  Follow-up visit  Date:  06/11/2023   ID:  Monica Hernandez 08/19/1975, [name]  Patient Location: Home Provider Location: Office/Clinic  Participants: Patient Location of Patient: Home Location of Provider: Telehealth Consent was obtain for visit to be over via telehealth. I verified that I am speaking with the correct person using two identifiers.  PCP:  Monica Laroche, FNP   Chief Complaint: Cough, nasal congestion and fever  History of Present Illness:    Monica Hernandez is a 48 y.o. female who has a video visit for complaint of cough, nasal congestion and nausea for the last 1 week.  She had an e-visit in the last week, and was prescribed doxycycline and Tessalon for it.  She had persistent cough and nausea, and has episodes of vomiting at times as well.  Denies any abdominal pain or diarrhea currently.  She had an episode of fever last night.  She has also tried taking Robitussin as needed for cough.  The patient does not have symptoms concerning for COVID-19 infection (fever, chills, cough, or new shortness of breath).   Past Medical, Surgical, Social History, Allergies, and Medications have been Reviewed.  Past Medical History:  Diagnosis Date   Allergy    Anemia    Asthma    Dysplastic nevus 09/23/2022   Right shoulder post - Moderate   Eczema    GERD (gastroesophageal reflux disease)    History of kidney stones    Hypertension    Kidney stone    Mitral valve prolapse    was told as a kid that she had this but during ECHO for eval, ECHO did not indicate this , see  08-25-16 epic result    PONV (postoperative nausea and vomiting)    epidural with c section caused N/V    Pre-diabetes    Recurrent upper respiratory infection (URI)    Ulcer    Past Surgical History:  Procedure Laterality Date   ABDOMINAL HYSTERECTOMY     total   ADENOIDECTOMY     ADENOIDECTOMY, TONSILLECTOMY AND MYRINGOTOMY WITH TUBE PLACEMENT     CESAREAN SECTION     FRACTURE SURGERY     GASTRIC ROUX-EN-Y N/A 03/31/2017   Procedure: LAPAROSCOPIC ROUX-EN-Y GASTRIC BYPASS WITH UPPER ENDOSCOPY;  Surgeon: Sheliah Hatch, De Blanch, MD;  Location: [facility];  Service: General;  Laterality: N/A;   HERNIA REPAIR     umbilical   LEG SURGERY Left    MULTIPLE TOOTH EXTRACTIONS Bilateral 2023   TONSILLECTOMY     TUBAL LIGATION       Current Meds  Medication Sig   acetaminophen (TYLENOL) 500 MG tablet Take 1,000 mg by mouth every 6 (six) hours as needed for mild pain (pain score 1-3) or moderate pain (pain score 4-6).   albuterol (VENTOLIN HFA) 108 (90 Base) MCG/ACT inhaler Inhale 2 puffs into the lungs every 6 (six) hours as needed for wheezing or shortness of breath.   benzonatate (TESSALON) 100 MG capsule Take 1-2 caps PO TID PRN   Cholecalciferol (VITAMIN D3) 25 MCG (1000 UT) CAPS  Take 1 capsule (1,000 Units total) by mouth daily.   doxycycline (VIBRAMYCIN) 100 MG capsule Take 1 capsule (100 mg total) by mouth 2 (two) times daily.   EPINEPHrine 0.3 mg/0.3 mL IJ SOAJ injection Inject 0.3 mg into the muscle as needed for anaphylaxis.   famotidine (PEPCID) 40 MG tablet Take 1 tablet (40 mg total) by mouth 2 (two) times daily.   furosemide (LASIX) 20 MG tablet Take 20 mg daily as needed for swelling, weight gain   meclizine (ANTIVERT) 25 MG tablet Take 1 tablet (25 mg total) by mouth 3 (three) times daily as needed for dizziness.   metFORMIN (GLUCOPHAGE) 500 MG tablet Take 1 tablet (500 mg total) by mouth daily.   methocarbamol (ROBAXIN) 500 MG tablet Take 1 tablet (500 mg total) by mouth every 8  (eight) hours as needed for muscle spasms (if severe pain in leg).   metoprolol succinate (TOPROL-XL) 25 MG 24 hr tablet Take 1 tablet (25 mg total) by mouth daily.   Multiple Vitamins-Minerals (BARIATRIC MULTIVITAMINS/IRON PO) Take 2 tablets by mouth 2 (two) times daily. Chewable tablets     Allergies:   Bee venom, Contrave [naltrexone-bupropion hcl er], Erythromycin, Shellfish allergy, Zithromax [azithromycin], Belviq [lorcaserin hcl], Fexofenadine-pseudoephed er, Liraglutide -weight management, Qsymia [phentermine-topiramate], Levocetirizine, Methylprednisolone, Naltrexone, Fish allergy, and Sulfa antibiotics   ROS:   Please see the history of present illness. All other systems reviewed and are negative.   Labs/Other Tests and Data Reviewed:    Recent Labs: 05/28/2023: ALT 19; BUN 8; Creatinine, Ser 0.85; Hemoglobin 12.3; Platelets 252; Potassium 5.1; Sodium 140; TSH 2.690   Recent Lipid Panel Lab Results  Component Value Date/Time   CHOL 180 05/28/2023 09:11 AM   TRIG 59 05/28/2023 09:11 AM   HDL 74 05/28/2023 09:11 AM   CHOLHDL 2.4 05/28/2023 09:11 AM   CHOLHDL 2.7 04/10/2021 07:11 AM   LDLCALC 95 05/28/2023 09:11 AM   LDLCALC 87 04/10/2021 07:11 AM    Wt Readings from Last 3 Encounters:  06/11/23 225 lb (102.1 kg)  05/28/23 229 lb 12.8 oz (104.2 kg)  01/29/23 225 lb 0.6 oz (102.1 kg)     Objective:    Vital Signs:  Ht 5\' 4"  (1.626 m)   Wt 225 lb (102.1 kg)   BMI 38.62 kg/m    VITAL SIGNS:  reviewed GEN:  no acute distress EYES:  sclerae anicteric, EOMI - Extraocular Movements Intact RESPIRATORY:  normal respiratory effort, symmetric expansion NEURO:  alert and oriented x 3, no obvious focal deficit PSYCH:  normal affect  ASSESSMENT & PLAN:    Acute bronchitis vs asthma exacerbation Continue doxycycline for now Started prednisone taper - she has tolerated Prednisone before Albuterol as needed for dyspnea or wheezing Tylenol as needed for fever or  chills Robitussin as needed for cough Zofran as needed for nausea Avoid smoking   I discussed the assessment and treatment plan with the patient. The patient was provided an opportunity to ask questions, and all were answered. The patient agreed with the plan and demonstrated an understanding of the instructions.   The patient was advised to call back or seek an in-person evaluation if the symptoms worsen or if the condition fails to improve as anticipated.  The above assessment and management plan was discussed with the patient. The patient verbalized understanding of and has agreed to the management plan.   Medication Adjustments/Labs and Tests Ordered: Current medicines are reviewed at length with the patient today.  Concerns regarding medicines are outlined  above.   Tests Ordered: No orders of the defined types were placed in this encounter.   Medication Changes: No orders of the defined types were placed in this encounter.    Note: This dictation was prepared with Dragon dictation along with smaller phrase technology. Similar sounding words can be transcribed inadequately or may not be corrected upon review. Any transcriptional errors that result from this process are unintentional.      Disposition:  Follow up  Signed, Anabel Halon, MD  06/11/2023 8:01 AM     [facility]

## 2023-06-12 NOTE — Telephone Encounter (Signed)
 Pt advised with verbal understanding

## 2023-07-13 ENCOUNTER — Other Ambulatory Visit (HOSPITAL_COMMUNITY): Payer: Self-pay | Admitting: Family Medicine

## 2023-07-13 DIAGNOSIS — Z1231 Encounter for screening mammogram for malignant neoplasm of breast: Secondary | ICD-10-CM

## 2023-07-18 ENCOUNTER — Other Ambulatory Visit: Payer: Self-pay | Admitting: Nurse Practitioner

## 2023-07-18 ENCOUNTER — Telehealth: Admitting: Nurse Practitioner

## 2023-07-18 DIAGNOSIS — B9689 Other specified bacterial agents as the cause of diseases classified elsewhere: Secondary | ICD-10-CM

## 2023-07-18 DIAGNOSIS — L089 Local infection of the skin and subcutaneous tissue, unspecified: Secondary | ICD-10-CM

## 2023-07-18 DIAGNOSIS — J208 Acute bronchitis due to other specified organisms: Secondary | ICD-10-CM

## 2023-07-18 MED ORDER — DOXYCYCLINE HYCLATE 100 MG PO CAPS
100.0000 mg | ORAL_CAPSULE | Freq: Once | ORAL | 0 refills | Status: DC
Start: 2023-07-18 — End: 2023-07-20

## 2023-07-18 NOTE — Progress Notes (Signed)
E-Visit for Tick Bite  Thank you for describing your tick bite, Here is how we plan to help! Based on the information that you shared with me it looks like you have A tick that bite that we will treat with a short course of doxycycline.  In most cases a tick bite is painless and does not itch.  Most tick bites in which the tick is quickly removed do not require prescriptions. Ticks can transmit several diseases if they are infected and remain attacked to your skin. Therefore the length that the tick was attached and any symptoms you have experienced after the bite are import to accurately develop your custom treatment plan. In most cases a single dose of doxycycline may prevent the development of a more serious condition.  Based on your information I have Provided a home care guide for tick bites and  instructions on when to call for help. and I have sent a single dose of doxycycline to the pharmacy you selected. Please make sure that you selected a pharmacy that is open now.  Which ticks  are associated with illness?  The Wood Tick (dog tick) is the size of a watermelon seed and can sometimes transmit Rocky [place] spotted fever and [place] tick fever.   The Deer Tick (black-legged tick) is between the size of a poppy seed (pin head) and an apple seed, and can sometimes transmit Lyme disease.  A brown to black tick with a white splotch on its back is likely a female Amblyomma americanum ([place] tick). This tick has been associated with Southern Tick Associated illness ( STARI)  Lyme disease has become the most common tick-borne illness in the [place]. The risk of Lyme disease following a recognized deer tick bite is estimated to be 1%.  The majority of cases of Lyme disease start with a bull's eye rash at the site of the tick bite. The rash can occur days to weeks (typically 7-10 days) after a tick bite. Treatment with antibiotics is indicated if this rash appears. Flu-like symptoms  may accompany the rash, including: fever, chills, headaches, muscle aches, and fatigue. Removing ticks promptly may prevent tick borne disease.  What can be used to prevent Tick Bites?  Insect repellant with at leas 20% DEET. Wearing long pants with sock and shoes. Avoiding tall grass and heavily wooded areas. Checking your skin after being outdoors. Shower with a washcloth after outdoor exposures.  HOME CARE ADVICE FOR TICK BITE  Wood Tick Removal:  Use a pair of tweezers and grasp the wood tick close to the skin (on its head). Pull the wood tick straight upward without twisting or crushing it. Maintain a steady pressure until it releases its grip.   If tweezers aren't available, use fingers, a loop of thread around the jaws, or a needle between the jaws for traction.  Note: covering the tick with petroleum jelly, nail polish or rubbing alcohol doesn't work. Neither does touching the tick with a hot or cold object. Tiny Deer Tick Removal:   Needs to be scraped off with a knife blade or credit card edge. Place tick in a sealed container (e.g. glass jar, zip lock plastic bag), in case your doctor wants to see it. Tick's Head Removal:  If the wood tick's head breaks off in the skin, it must be removed. Clean the skin. Then use a sterile needle to uncover the head and lift it out or scrape it off.  If a very small piece   of the head remains, the skin will eventually slough it off. Antibiotic Ointment:  Wash the wound and your hands with soap and water after removal to prevent catching any tick disease.  Apply an over the counter antibiotic ointment (e.g. bacitracin) to the bite once. Expected Course: Tick bites normally don't itch or hurt. That's why they often go unnoticed. Call Your Doctor If:  You can't remove the tick or the tick's head Fever, a severe head ache, or rash occur in the next 2 weeks Bite begins to look infected Lyme's disease is common in your area You have not had a  tetanus in the last 10 years Your current symptoms become worse    MAKE SURE YOU  Understand these instructions. Will watch your condition. Will get help right away if you are not doing well or get worse.    Thank you for choosing an e-visit.  Your e-visit answers were reviewed by a board certified advanced clinical practitioner to complete your personal care plan. Depending upon the condition, your plan could have included both over the counter or prescription medications.  Please review your pharmacy choice. Make sure the pharmacy is open so you can pick up prescription now. If there is a problem, you may contact your provider through MyChart messaging and have the prescription routed to another pharmacy.  Your safety is important to us. If you have drug allergies check your prescription carefully.   For the next 24 hours you can use MyChart to ask questions about today's visit, request a non-urgent call back, or ask for a work or school excuse. You will get an email in the next two days asking about your experience. I hope that your e-visit has been valuable and will speed your recovery.  

## 2023-07-18 NOTE — Progress Notes (Signed)
 I have spent 5 minutes in review of e-visit questionnaire, review and updating patient chart, medical decision making and response to patient.   Claiborne Rigg, NP

## 2023-07-22 ENCOUNTER — Telehealth: Payer: Self-pay

## 2023-07-22 NOTE — Telephone Encounter (Signed)
 Copied from CRM [id number]. Topic: Clinical - Medication Question >> Jul 21, 2023  4:03 PM Antwanette L wrote: Reason for CRM: Johnna  from [facility] is calling because she needs clarification on doxycycline  (VIBRAMYCIN ) 100 MG capsule. Please contact Johnna at [phone]

## 2023-07-23 ENCOUNTER — Other Ambulatory Visit: Payer: Self-pay | Admitting: Family Medicine

## 2023-07-23 ENCOUNTER — Telehealth: Payer: Self-pay | Admitting: Family Medicine

## 2023-07-23 DIAGNOSIS — L089 Local infection of the skin and subcutaneous tissue, unspecified: Secondary | ICD-10-CM

## 2023-07-23 MED ORDER — DOXYCYCLINE HYCLATE 100 MG PO CAPS
100.0000 mg | ORAL_CAPSULE | Freq: Two times a day (BID) | ORAL | 0 refills | Status: AC
Start: 2023-07-23 — End: 2023-07-30

## 2023-07-23 NOTE — Telephone Encounter (Signed)
 Copied from CRM [id number]. Topic: Clinical - Medication Question >> Jul 21, 2023  4:03 PM Antwanette L wrote: Reason for CRM: Johnna  from [facility] is calling because she needs clarification on doxycycline  (VIBRAMYCIN ) 100 MG capsule. Please contact Johnna at [phone] >> Jul 23, 2023  8:40 AM Lizabeth Riggs wrote: [facility] is calling back to see what the quantity and dose is for the above medication. They received order with quantity is 1 and dose is TAKE 1 CAPSULE BY MOUTH NOW. Pharmacy stated that normally the dose and quantity is more than 1.  Please call back at [phone] to clarify the order.  Thanks

## 2023-07-29 ENCOUNTER — Encounter (HOSPITAL_COMMUNITY): Payer: Self-pay

## 2023-07-29 ENCOUNTER — Ambulatory Visit (HOSPITAL_COMMUNITY)
Admission: RE | Admit: 2023-07-29 | Discharge: 2023-07-29 | Disposition: A | Discharge status: Home / Self Care | Source: Ambulatory Visit | Attending: Family Medicine | Admitting: Family Medicine

## 2023-07-29 DIAGNOSIS — Z1231 Encounter for screening mammogram for malignant neoplasm of breast: Secondary | ICD-10-CM | POA: Diagnosis not present

## 2023-07-29 NOTE — Telephone Encounter (Signed)
 Monica Hernandez corrected prescription

## 2023-07-31 ENCOUNTER — Other Ambulatory Visit (HOSPITAL_COMMUNITY): Payer: Self-pay

## 2023-09-23 ENCOUNTER — Encounter: Payer: Self-pay | Admitting: Dermatology

## 2023-09-23 ENCOUNTER — Other Ambulatory Visit: Payer: Self-pay

## 2023-09-23 ENCOUNTER — Other Ambulatory Visit (HOSPITAL_COMMUNITY): Payer: Self-pay

## 2023-09-23 ENCOUNTER — Ambulatory Visit: Payer: Commercial Managed Care - PPO | Admitting: Dermatology

## 2023-09-23 VITALS — BP 131/83 | HR 74

## 2023-09-23 DIAGNOSIS — L309 Dermatitis, unspecified: Secondary | ICD-10-CM

## 2023-09-23 DIAGNOSIS — L578 Other skin changes due to chronic exposure to nonionizing radiation: Secondary | ICD-10-CM | POA: Diagnosis not present

## 2023-09-23 DIAGNOSIS — W908XXA Exposure to other nonionizing radiation, initial encounter: Secondary | ICD-10-CM | POA: Diagnosis not present

## 2023-09-23 DIAGNOSIS — L814 Other melanin hyperpigmentation: Secondary | ICD-10-CM | POA: Diagnosis not present

## 2023-09-23 DIAGNOSIS — D229 Melanocytic nevi, unspecified: Secondary | ICD-10-CM | POA: Diagnosis not present

## 2023-09-23 DIAGNOSIS — D1801 Hemangioma of skin and subcutaneous tissue: Secondary | ICD-10-CM

## 2023-09-23 DIAGNOSIS — Z1283 Encounter for screening for malignant neoplasm of skin: Secondary | ICD-10-CM | POA: Diagnosis not present

## 2023-09-23 DIAGNOSIS — L821 Other seborrheic keratosis: Secondary | ICD-10-CM | POA: Diagnosis not present

## 2023-09-23 DIAGNOSIS — D239 Other benign neoplasm of skin, unspecified: Secondary | ICD-10-CM | POA: Diagnosis not present

## 2023-09-23 MED ORDER — MOMETASONE FUROATE 0.1 % EX OINT
1.0000 | TOPICAL_OINTMENT | Freq: Two times a day (BID) | CUTANEOUS | 6 refills | Status: AC
Start: 2023-09-23 — End: ?
  Filled 2023-09-23 – 2023-09-28 (×2): qty 45, 30d supply, fill #0

## 2023-09-23 NOTE — Patient Instructions (Signed)

## 2023-09-23 NOTE — Progress Notes (Signed)
   Total Body Skin Exam (TBSE) Visit   Subjective  Monica Hernandez is a 48 y.o. female who presents for the following: Skin Cancer Screening and Full Body Skin Exam  Patient presents today for follow up visit for TBSE. Patient was last evaluated on 09/23/22. Patient denies medication changes. Patient reports she does not have spots, moles and lesions of concern to be evaluated. Patient reports throughout her lifetime she has had moderate sun exposure. Currently, patient reports if she has excessive sun exposure, she does apply sunscreen and/or wears protective coverings. Patient reports she does not have hx of bx. Patient denies  family history of skin cancers. The patient has spots, moles and lesions to be evaluated, some may be new or changing and the patient has concerns that these could be cancer.  The following portions of the chart were reviewed this encounter and updated as appropriate: medications, allergies, medical history  Review of Systems:  No other skin or systemic complaints except as noted in HPI or Assessment and Plan.  Objective  Well appearing patient in no apparent distress; mood and affect are within normal limits.  A full examination was performed including scalp, head, eyes, ears, nose, lips, neck, chest, axillae, abdomen, back, buttocks, bilateral upper extremities, bilateral lower extremities, hands, feet, fingers, toes, fingernails, and toenails. All findings within normal limits unless otherwise noted below.   Relevant physical exam findings are noted in the Assessment and Plan.    Assessment & Plan   LENTIGINES, SEBORRHEIC KERATOSES, HEMANGIOMAS - Benign normal skin lesions - Benign-appearing - Call for any changes  MELANOCYTIC NEVI - Tan-brown and/or pink-flesh-colored symmetric macules and papules - Benign appearing on exam today - Observation - Call clinic for new or changing moles - Recommend daily use of broad spectrum spf 30+ sunscreen to sun-exposed  areas.   MODERATE ACTINIC DAMAGE - Chronic condition, secondary to cumulative UV/sun exposure - diffuse scaly erythematous macules with underlying dyspigmentation - Recommend daily broad spectrum sunscreen SPF 30+ to sun-exposed areas, reapply every 2 hours as needed.  - Staying in the shade or wearing long sleeves, sun glasses (UVA+UVB protection) and wide brim hats (4-inch brim around the entire circumference of the hat) are also recommended for sun protection.  - Call for new or changing lesions.  DERMATOFIBROMA Exam: Firm pink/brown papulenodule with dimple sign. Treatment Plan: A dermatofibroma is a benign growth possibly related to trauma, such as an insect bite, cut from shaving, or inflamed acne-type bump.  Treatment options to remove include shave or excision with resulting scar and risk of recurrence.  Since benign-appearing and not bothersome, will observe for now.   HAND DERMATITIS Exam:Scaly pink plaques +/- fissures  Well controlled  Hand Dermatitis is a chronic type of eczema that can come and go on the hands and fingers.  While there is no cure, the rash and symptoms can be managed with topical prescription medications, and for more severe cases, with systemic medications.  Recommend mild soap and routine use of moisturizing cream after handwashing.  Minimize soap/water exposure when possible.    Treatment Plan: - Recommend mild soap and moisturizing cream with hand washing.  - Prescribed TMC 0.1%ointmnet   SKIN CANCER SCREENING PERFORMED TODAY.    Return in about 1 year (around 09/22/2024) for TBSE.  I, Jetta Ager, am acting as Neurosurgeon for [employer], DO.  Documentation: I have reviewed the above documentation for accuracy and completeness, and I agree with the above.  Delon Lenis, DO

## 2023-09-28 ENCOUNTER — Encounter (HOSPITAL_COMMUNITY): Payer: Self-pay

## 2023-09-28 ENCOUNTER — Other Ambulatory Visit: Payer: Self-pay

## 2023-09-28 ENCOUNTER — Ambulatory Visit: Admitting: Family Medicine

## 2023-09-28 ENCOUNTER — Other Ambulatory Visit (HOSPITAL_COMMUNITY): Payer: Self-pay

## 2023-09-28 ENCOUNTER — Encounter: Payer: Self-pay | Admitting: Family Medicine

## 2023-09-28 VITALS — BP 121/85 | HR 70 | Ht 64.0 in | Wt 232.0 lb

## 2023-09-28 DIAGNOSIS — R7301 Impaired fasting glucose: Secondary | ICD-10-CM

## 2023-09-28 DIAGNOSIS — A6920 Lyme disease, unspecified: Secondary | ICD-10-CM

## 2023-09-28 DIAGNOSIS — B9789 Other viral agents as the cause of diseases classified elsewhere: Secondary | ICD-10-CM

## 2023-09-28 DIAGNOSIS — J329 Chronic sinusitis, unspecified: Secondary | ICD-10-CM

## 2023-09-28 DIAGNOSIS — E7849 Other hyperlipidemia: Secondary | ICD-10-CM | POA: Diagnosis not present

## 2023-09-28 DIAGNOSIS — S90822A Blister (nonthermal), left foot, initial encounter: Secondary | ICD-10-CM

## 2023-09-28 DIAGNOSIS — S90821A Blister (nonthermal), right foot, initial encounter: Secondary | ICD-10-CM

## 2023-09-28 DIAGNOSIS — E038 Other specified hypothyroidism: Secondary | ICD-10-CM | POA: Diagnosis not present

## 2023-09-28 DIAGNOSIS — S90829A Blister (nonthermal), unspecified foot, initial encounter: Secondary | ICD-10-CM | POA: Insufficient documentation

## 2023-09-28 DIAGNOSIS — E559 Vitamin D deficiency, unspecified: Secondary | ICD-10-CM | POA: Diagnosis not present

## 2023-09-28 MED ORDER — LEVOCETIRIZINE DIHYDROCHLORIDE 5 MG PO TABS
5.0000 mg | ORAL_TABLET | Freq: Every evening | ORAL | 1 refills | Status: AC
  Filled 2023-09-28 (×2): qty 60, 60d supply, fill #0
  Filled 2024-02-15: qty 60, 60d supply, fill #1

## 2023-09-28 NOTE — Assessment & Plan Note (Signed)
 Pending labs - will follow up once results are available. Supportive therapy discussed, including use of NSAIDs for pain relief and gentle stretching or physical therapy for neck stiffness. Recommend improving sleep quality, stress reduction techniques, and prioritizing rest and self-care to support recovery.

## 2023-09-28 NOTE — Assessment & Plan Note (Signed)
-  Medications: Take Xyzal  5 mg at bedtime. -Nasal Care: Use saline spray or neti pot to clear congestion. -Hydration: Drink plenty of fluids to thin mucus and reduce pressure. -Pain Management: Use Tylenol  Cold; add ibuprofen (if safe) for pain/inflammation. -Monitor: Watch for signs of bacterial infection (worsening pain, fever, thick discharge). -Education: Rest, hydrate, and avoid irritants (e.g., smoke, allergens). -Humidify: Use a humidifier in dry environments.

## 2023-09-28 NOTE — Patient Instructions (Addendum)
 I appreciate the opportunity to provide care to you today!    Follow up:  4 months  Labs: please stop by the lab today to get your blood drawn (CBC, CMP, TSH, Lipid profile, HgA1c, Vit D)  Viral Sinusitis: -Medications: Take Xyzal  5 mg at bedtime. -Nasal Care: Use saline spray or neti pot to clear congestion. -Hydration: Drink plenty of fluids to thin mucus and reduce pressure. -Pain Management: Use Tylenol  Cold; add ibuprofen (if safe) for pain/inflammation. -Monitor: Watch for signs of bacterial infection (worsening pain, fever, thick discharge). -Education: Rest, hydrate, and avoid irritants (e.g., smoke, allergens). -Humidify: Use a humidifier in dry environments.  Blister under feet:  -Avoid Popping: Keep blisters intact to prevent infection. -Clean: Wash gently with mild soap and water, then pat dry. -Apply Antiseptic: Use Neosporin or iodine if broken. -Cover: Use a sterile bandage; change if wet or dirty. -Elevate: Raise feet for 20-30 minutes to reduce swelling. -Rest: Limit walking or standing to aid healing. -Monitor: Check for infection (redness, swelling, pus). -Change Bandages: Replace daily or if wet/dirty. -Soak: Optional warm Epsom salt soak for 10-15 minutes. -Moisturize: Use aloe vera or gentle moisturizer as healing progresses. Pain Management: -OTC Pain Relief: acetaminophen  for pain and swelling. Please follow up if your symptoms worsen or fail to improve.  Post-Treatment Lyme Disease Syndrome (PTLDS) Plan: -Pain/Stiffness: Use NSAIDs (e.g., ibuprofen) and consider gentle stretching or physical therapy for neck stiffness. -Fatigue: Recommend improving sleep quality, stress reduction, and rest/self-care.    Please continue to a heart-healthy diet and increase your physical activities. Try to exercise for at least five days a week.    It was a pleasure to see you and I look forward to continuing to work together on your health and well-being. Please  do not hesitate to call the office if you need care or have questions about your care.  In case of emergency, please visit the Emergency Department for urgent care, or contact our clinic at [phone] to schedule an appointment. We're here to help you!   Have a wonderful day and week. With Gratitude, Matthan Sledge MSN, FNP-BC

## 2023-09-28 NOTE — Progress Notes (Signed)
 Established Patient Office Visit  Subjective:  Patient ID: Monica Hernandez, female    DOB: 1975/09/28  Age: 48 y.o. MRN: [id number]  CC:  Chief Complaint  Patient presents with   Blister    Blisters on the bottom of both feet   Sinusitis    Sinus infection, pressure above and below the eyes, stuffy nose    HPI Monica Hernandez is a 48 y.o. female with past medical history of obesity, asthma, fatigue and prediabetes presents for f/u of  chronic medical conditions.  Friction Blisters: The patient presents with a water blister on the bottom of her foot that developed yesterday after work. She reports excessive sweating of her feet, especially after long shifts in hot weather. She walked approximately 7 miles at work, performing tasks such as unloading merchandise, cashiering, and packing.   Viral sinusitis: The patient presents with symptoms consistent with a viral sinus infection that began last Thursday. She reports waking up with goopy eyes and nasal congestion, along with sinus headaches, facial pain, and pressure. She has been using over-the-counter medications including Zyrtec (which she has taken for 10 years), Flonase  nasal spray, and Tylenol  Cold during symptom flare-ups. She has a known allergy  to Allegra. The patient denies cough, fever, sore throat, postnasal drip, fatigue, or changes in taste or smell. Despite current treatment, symptoms persist without significant improvement.  Post-treatment Lyme disease syndrome (PTLDS): The patient presents with ongoing neck stiffness and localized sensitivity at the site of a tick bite sustained approximately 2 months ago. She completed a course of doxycycline  for presumed Lyme disease but continues to experience persistent symptoms. She also reports fatigue that feels different from typical work-related tiredness. She denies fever, chills, muscle aches, or rash.     Past Medical History:  Diagnosis Date   Allergy     Anemia    Asthma     Dysplastic nevus 09/23/2022   Right shoulder post - Moderate   Eczema    GERD (gastroesophageal reflux disease)    History of kidney stones    Hypertension    Kidney stone    Mitral valve prolapse    was told as a kid that she had this but during ECHO for eval, ECHO did not indicate this , see 08-25-16 epic result    PONV (postoperative nausea and vomiting)    epidural with c section caused N/V    Pre-diabetes    Recurrent upper respiratory infection (URI)    Ulcer     Past Surgical History:  Procedure Laterality Date   ABDOMINAL HYSTERECTOMY     total   ADENOIDECTOMY     ADENOIDECTOMY, TONSILLECTOMY AND MYRINGOTOMY WITH TUBE PLACEMENT     CESAREAN SECTION     FRACTURE SURGERY     GASTRIC ROUX-EN-Y N/A 03/31/2017   Procedure: LAPAROSCOPIC ROUX-EN-Y GASTRIC BYPASS WITH UPPER ENDOSCOPY;  Surgeon: Stevie, Herlene Righter, MD;  Location: [facility];  Service: General;  Laterality: N/A;   HERNIA REPAIR     umbilical   LEG SURGERY Left    MULTIPLE TOOTH EXTRACTIONS Bilateral 2023   TONSILLECTOMY     TUBAL LIGATION      Family History  Problem Relation Age of Onset   Hypertension Mother    Stroke Mother    Thyroid  cancer Mother    Stroke Father    Lung cancer Father    Brain cancer Father    Bone cancer Father    Hypertension Sister    Thyroid  cancer Sister  Hypertension Brother    Heart disease Maternal Grandmother    Diabetes Maternal Grandmother    Asthma Other    Cancer Other    Stomach cancer Neg Hx    Colon cancer Neg Hx     Social History   Socioeconomic History   Marital status: Married    Spouse name: Not on file   Number of children: 1   Years of education: Not on file   Highest education level: Some college, no degree  Occupational History    Employer: [employer]    Comment: heart care    Comment: [employer]  Tobacco Use   Smoking status: Former    Current packs/day: 0.00    Average packs/day: 0.5 packs/day for 15.0 years (7.5 ttl pk-yrs)     Types: Cigarettes    Start date: 47    Quit date: 2010    Years since quitting: 15.5   Smokeless tobacco: Never   Tobacco comments:    smoked since age 52 ; quit in 2010   Vaping Use   Vaping status: Never Used  Substance and Sexual Activity   Alcohol use: No   Drug use: No   Sexual activity: Yes    Partners: Male    Birth control/protection: Surgical  Other Topics Concern   Not on file  Social History Narrative   Live with her spouse ans her son   Social Drivers of Corporate investment banker Strain: Low Risk  (06/10/2023)   Overall Financial Resource Strain (CARDIA)    Difficulty of Paying Living Expenses: Not very hard  Food Insecurity: Food Insecurity Present (06/10/2023)   Hunger Vital Sign    Worried About Running Out of Food in the Last Year: Never true    Ran Out of Food in the Last Year: Sometimes true  Transportation Needs: No Transportation Needs (06/10/2023)   PRAPARE - Administrator, Civil Service (Medical): No    Lack of Transportation (Non-Medical): No  Physical Activity: Sufficiently Active (06/10/2023)   Exercise Vital Sign    Days of Exercise per Week: 5 days    Minutes of Exercise per Session: 60 min  Stress: No Stress Concern Present (06/10/2023)   [employer] of Occupational Health - Occupational Stress Questionnaire    Feeling of Stress : Not at all  Social Connections: Moderately Isolated (06/10/2023)   Social Connection and Isolation Panel    Frequency of Communication with Friends and Family: Three times a week    Frequency of Social Gatherings with Friends and Family: Once a week    Attends Religious Services: Never    Database administrator or Organizations: No    Attends Engineer, structural: Not on file    Marital Status: Married  Catering manager Violence: Not on file    Outpatient Medications Prior to Visit  Medication Sig Dispense Refill   acetaminophen  (TYLENOL ) 500 MG tablet Take 1,000 mg by mouth every 6 (six)  hours as needed for mild pain (pain score 1-3) or moderate pain (pain score 4-6).     albuterol  (VENTOLIN  HFA) 108 (90 Base) MCG/ACT inhaler Inhale 2 puffs into the lungs every 6 (six) hours as needed for wheezing or shortness of breath. 18 g 1   benzonatate  (TESSALON ) 100 MG capsule Take 1-2 caps PO TID PRN (Patient not taking: Reported on 09/23/2023) 20 capsule 0   Cholecalciferol  (VITAMIN D3) 25 MCG (1000 UT) CAPS Take 1 capsule (1,000 Units total) by mouth  daily. 60 capsule 3   EPINEPHrine  0.3 mg/0.3 mL IJ SOAJ injection Inject 0.3 mg into the muscle as needed for anaphylaxis. 2 each 1   famotidine  (PEPCID ) 40 MG tablet Take 1 tablet (40 mg total) by mouth 2 (two) times daily. (Patient not taking: Reported on 09/23/2023) 60 tablet 3   furosemide  (LASIX ) 20 MG tablet Take 20 mg daily as needed for swelling, weight gain 90 tablet 3   meclizine  (ANTIVERT ) 25 MG tablet Take 1 tablet (25 mg total) by mouth 3 (three) times daily as needed for dizziness. 30 tablet 0   metFORMIN  (GLUCOPHAGE ) 500 MG tablet Take 1 tablet (500 mg total) by mouth daily. (Patient not taking: Reported on 09/23/2023) 180 tablet 3   methocarbamol  (ROBAXIN ) 500 MG tablet Take 1 tablet (500 mg total) by mouth every 8 (eight) hours as needed for muscle spasms (if severe pain in leg). (Patient not taking: Reported on 09/23/2023) 30 tablet 0   metoprolol  succinate (TOPROL -XL) 25 MG 24 hr tablet Take 1 tablet (25 mg total) by mouth daily. 90 tablet 3   mometasone  (ELOCON ) 0.1 % ointment Apply 2 times daily for 3 weeks, allow ointment to sit on skin for 30 mins prior to washing off.  Use 3 weeks on then 3 weeks off until area improves. 45 g 6   Multiple Vitamins-Minerals (BARIATRIC MULTIVITAMINS/IRON PO) Take 2 tablets by mouth 2 (two) times daily. Chewable tablets     ondansetron  (ZOFRAN ) 4 MG tablet Take 1 tablet (4 mg total) by mouth every 8 (eight) hours as needed for nausea or vomiting. 20 tablet 0   pantoprazole  (PROTONIX ) 40 MG tablet  Take 1 tablet (40 mg total) by mouth 2 (two) times daily before a meal for 14 days. 28 tablet 0   No facility-administered medications prior to visit.    Allergies  Allergen Reactions   Bee Venom Anaphylaxis   Contrave [Naltrexone-Bupropion  Hcl Er] Nausea And Vomiting    Pt states it causes bloody stool, bloody vomit, and heart palpitations   Erythromycin Nausea And Vomiting and Rash    Wheezing    Shellfish Allergy  Anaphylaxis   Zithromax [Azithromycin] Nausea And Vomiting, Rash and Other (See Comments)   Belviq [Lorcaserin Hcl] Other (See Comments)    Causes changes in vision and respiratory issues   Fexofenadine-Pseudoephed Er Nausea And Vomiting and Nausea Only   Liraglutide  -Weight Management Other (See Comments)    Causes fainting (per pt, dose was toohigh and decreased gluc too much)   Qsymia [Phentermine-Topiramate Er] Other (See Comments)    Causes change in vision and respiratory issues    Sulfa Antibiotics Rash and Dermatitis   Levocetirizine Other (See Comments)    headache   Methylprednisolone  Other (See Comments)    Low heart rate   Naltrexone Other (See Comments)    Bloody stools.    Fish Allergy  Rash    ROS Review of Systems  Constitutional:  Negative for chills and fever.  HENT:  Positive for congestion (nasal congestion) and sinus pressure. Negative for postnasal drip, rhinorrhea, sinus pain, sneezing and sore throat.   Eyes:  Negative for visual disturbance.  Respiratory:  Negative for chest tightness and shortness of breath.   Musculoskeletal:        Foot blisters  Neurological:  Negative for dizziness and headaches.      Objective:    Physical Exam HENT:     Head: Normocephalic.     Mouth/Throat:     Mouth: Mucous membranes are  moist.  Cardiovascular:     Rate and Rhythm: Normal rate.     Heart sounds: Normal heart sounds.  Pulmonary:     Effort: Pulmonary effort is normal.     Breath sounds: Normal breath sounds.  Musculoskeletal:        Feet:  Feet:     Right foot:     Skin integrity: Blister and callus present.     Left foot:     Skin integrity: Blister and callus present.  Neurological:     Mental Status: She is alert.     BP 121/85   Pulse 70   Ht 5' 4 (1.626 m)   Wt 232 lb (105.2 kg)   SpO2 98%   BMI 39.82 kg/m  Wt Readings from Last 3 Encounters:  09/28/23 232 lb (105.2 kg)  06/11/23 225 lb (102.1 kg)  05/28/23 229 lb 12.8 oz (104.2 kg)    Lab Results  Component Value Date   TSH 2.690 05/28/2023   Lab Results  Component Value Date   WBC 6.7 05/28/2023   HGB 12.3 05/28/2023   HCT 37.4 05/28/2023   MCV 89 05/28/2023   PLT 252 05/28/2023   Lab Results  Component Value Date   NA 140 05/28/2023   K 5.1 05/28/2023   CO2 25 05/28/2023   GLUCOSE 86 05/28/2023   BUN 8 05/28/2023   CREATININE 0.85 05/28/2023   BILITOT 0.4 05/28/2023   ALKPHOS 81 05/28/2023   AST 27 05/28/2023   ALT 19 05/28/2023   PROT 6.8 05/28/2023   ALBUMIN 4.4 05/28/2023   CALCIUM 9.5 05/28/2023   ANIONGAP 11 12/13/2022   EGFR 84 05/28/2023   Lab Results  Component Value Date   CHOL 180 05/28/2023   Lab Results  Component Value Date   HDL 74 05/28/2023   Lab Results  Component Value Date   LDLCALC 95 05/28/2023   Lab Results  Component Value Date   TRIG 59 05/28/2023   Lab Results  Component Value Date   CHOLHDL 2.4 05/28/2023   Lab Results  Component Value Date   HGBA1C 5.9 (H) 05/28/2023      Assessment & Plan:  Lyme disease Assessment & Plan: Pending labs - will follow up once results are available. Supportive therapy discussed, including use of NSAIDs for pain relief and gentle stretching or physical therapy for neck stiffness. Recommend improving sleep quality, stress reduction techniques, and prioritizing rest and self-care to support recovery.    Orders: -     Lyme Disease Serology w/Reflex  Friction blisters of the soles, unspecified laterality, initial encounter Assessment &  Plan: Blisters likely due to prolonged standing and sweaty feet.  I recommend: Do not pop blisters to avoid infection. Clean gently with mild soap and water; pat dry. Apply antiseptic (e.g., Neosporin) if blister is open. Cover with a sterile bandage and change if wet or dirty. Elevate feet for 20-30 minutes to reduce swelling. Rest and limit prolonged standing. Monitor for signs of infection (redness, pus, increased pain). Optional: Soak in warm Epsom salt water for 10-15 minutes. Moisturize with aloe vera or gentle lotion as healing progresses. Pain: Use OTC acetaminophen  as needed.  Encouraged to follow up with dermatology for worsening of symptoms   Viral sinusitis Assessment & Plan: -Medications: Take Xyzal  5 mg at bedtime. -Nasal Care: Use saline spray or neti pot to clear congestion. -Hydration: Drink plenty of fluids to thin mucus and reduce pressure. -Pain Management: Use Tylenol  Cold; add ibuprofen (if  safe) for pain/inflammation. -Monitor: Watch for signs of bacterial infection (worsening pain, fever, thick discharge). -Education: Rest, hydrate, and avoid irritants (e.g., smoke, allergens). -Humidify: Use a humidifier in dry environments.  Orders: -     Levocetirizine Dihydrochloride ; Take 1 tablet (5 mg total) by mouth every evening.  Dispense: 60 tablet; Refill: 1  IFG (impaired fasting glucose) -     Hemoglobin A1c  Vitamin D  deficiency -     VITAMIN D  25 Hydroxy (Vit-D Deficiency, Fractures)  TSH (thyroid -stimulating hormone deficiency) -     TSH + free T4  Other hyperlipidemia -     Lipid panel -     CMP14+EGFR -     CBC with Differential/Platelet  Note: This chart has been completed using Engineer, civil (consulting) software, and while attempts have been made to ensure accuracy, certain words and phrases may not be transcribed as intended.    Follow-up: No follow-ups on file.   Nancey Kreitz, FNP

## 2023-09-28 NOTE — Assessment & Plan Note (Signed)
 Blisters likely due to prolonged standing and sweaty feet.  I recommend: Do not pop blisters to avoid infection. Clean gently with mild soap and water; pat dry. Apply antiseptic (e.g., Neosporin) if blister is open. Cover with a sterile bandage and change if wet or dirty. Elevate feet for 20-30 minutes to reduce swelling. Rest and limit prolonged standing. Monitor for signs of infection (redness, pus, increased pain). Optional: Soak in warm Epsom salt water for 10-15 minutes. Moisturize with aloe vera or gentle lotion as healing progresses. Pain: Use OTC acetaminophen  as needed.  Encouraged to follow up with dermatology for worsening of symptoms

## 2023-09-29 ENCOUNTER — Other Ambulatory Visit: Payer: Self-pay

## 2023-09-29 LAB — CMP14+EGFR
ALT: 16 IU/L (ref 0–32)
AST: 27 IU/L (ref 0–40)
Albumin: 4.1 g/dL (ref 3.9–4.9)
Alkaline Phosphatase: 87 IU/L (ref 44–121)
BUN/Creatinine Ratio: 18 (ref 9–23)
BUN: 14 mg/dL (ref 6–24)
Bilirubin Total: 0.3 mg/dL (ref 0.0–1.2)
CO2: 22 mmol/L (ref 20–29)
Calcium: 9.3 mg/dL (ref 8.7–10.2)
Chloride: 103 mmol/L (ref 96–106)
Creatinine, Ser: 0.8 mg/dL (ref 0.57–1.00)
Globulin, Total: 2.4 g/dL (ref 1.5–4.5)
Glucose: 83 mg/dL (ref 70–99)
Potassium: 4.8 mmol/L (ref 3.5–5.2)
Sodium: 144 mmol/L (ref 134–144)
Total Protein: 6.5 g/dL (ref 6.0–8.5)
eGFR: 90 mL/min/1.73 (ref >59)

## 2023-09-29 LAB — CBC WITH DIFFERENTIAL/PLATELET
Basophils Absolute: 0.1 x10E3/uL (ref 0.0–0.2)
Basos: 1 %
EOS (ABSOLUTE): 0.2 x10E3/uL (ref 0.0–0.4)
Eos: 3 %
Hematocrit: 37.2 % (ref 34.0–46.6)
Hemoglobin: 11.9 g/dL (ref 11.1–15.9)
Immature Grans (Abs): 0 x10E3/uL (ref 0.0–0.1)
Immature Granulocytes: 0 %
Lymphocytes Absolute: 2.4 x10E3/uL (ref 0.7–3.1)
Lymphs: 36 %
MCH: 28.9 pg (ref 26.6–33.0)
MCHC: 32 g/dL (ref 31.5–35.7)
MCV: 90 fL (ref 79–97)
Monocytes Absolute: 0.5 x10E3/uL (ref 0.1–0.9)
Monocytes: 7 %
Neutrophils Absolute: 3.5 x10E3/uL (ref 1.4–7.0)
Neutrophils: 53 %
Platelets: 249 x10E3/uL (ref 150–450)
RBC: 4.12 x10E6/uL (ref 3.77–5.28)
RDW: 13.2 % (ref 11.7–15.4)
WBC: 6.6 x10E3/uL (ref 3.4–10.8)

## 2023-09-29 LAB — LIPID PANEL
Chol/HDL Ratio: 2.6 ratio (ref 0.0–4.4)
Cholesterol, Total: 159 mg/dL (ref 100–199)
HDL: 61 mg/dL (ref >39)
LDL Chol Calc (NIH): 87 mg/dL (ref 0–99)
Triglycerides: 50 mg/dL (ref 0–149)
VLDL Cholesterol Cal: 11 mg/dL (ref 5–40)

## 2023-09-29 LAB — VITAMIN D 25 HYDROXY (VIT D DEFICIENCY, FRACTURES): Vit D, 25-Hydroxy: 42.1 ng/mL (ref 30.0–100.0)

## 2023-09-29 LAB — HEMOGLOBIN A1C
Est. average glucose Bld gHb Est-mCnc: 123 mg/dL
Hgb A1c MFr Bld: 5.9 % — ABNORMAL HIGH (ref 4.8–5.6)

## 2023-09-29 LAB — TSH+FREE T4
Free T4: 1.01 ng/dL (ref 0.82–1.77)
TSH: 2.02 u[IU]/mL (ref 0.450–4.500)

## 2023-09-29 LAB — LYME DISEASE SEROLOGY W/REFLEX: Lyme Total Antibody EIA: NEGATIVE

## 2023-10-03 ENCOUNTER — Ambulatory Visit: Payer: Self-pay | Admitting: Family Medicine

## 2023-10-08 ENCOUNTER — Encounter: Payer: Self-pay | Admitting: Family Medicine

## 2023-10-19 NOTE — Telephone Encounter (Signed)
 Called patient and left a voicemail to call office to schedule an appointment with first available provider.

## 2023-10-23 ENCOUNTER — Encounter (HOSPITAL_COMMUNITY): Payer: Self-pay | Admitting: *Deleted

## 2023-10-28 ENCOUNTER — Telehealth: Admitting: Physician Assistant

## 2023-10-28 DIAGNOSIS — J019 Acute sinusitis, unspecified: Secondary | ICD-10-CM

## 2023-10-28 DIAGNOSIS — B9689 Other specified bacterial agents as the cause of diseases classified elsewhere: Secondary | ICD-10-CM

## 2023-10-28 MED ORDER — AMOXICILLIN-POT CLAVULANATE 875-125 MG PO TABS: 1.0000 | ORAL_TABLET | Freq: Two times a day (BID) | ORAL | 0 refills | Status: AC

## 2023-10-28 NOTE — Progress Notes (Signed)
 I have spent 5 minutes in review of e-visit questionnaire, review and updating patient chart, medical decision making and response to patient.   Shawnda Mauney, PA-C

## 2023-10-28 NOTE — Progress Notes (Signed)

## 2023-12-10 ENCOUNTER — Other Ambulatory Visit (HOSPITAL_COMMUNITY): Payer: Self-pay

## 2023-12-10 ENCOUNTER — Encounter: Payer: Self-pay | Admitting: Student

## 2023-12-10 ENCOUNTER — Ambulatory Visit: Attending: Student | Admitting: Student

## 2023-12-10 VITALS — BP 128/74 | HR 69 | Ht 64.0 in | Wt 231.0 lb

## 2023-12-10 DIAGNOSIS — J309 Allergic rhinitis, unspecified: Secondary | ICD-10-CM

## 2023-12-10 DIAGNOSIS — Z87898 Personal history of other specified conditions: Secondary | ICD-10-CM

## 2023-12-10 DIAGNOSIS — R002 Palpitations: Secondary | ICD-10-CM

## 2023-12-10 MED ORDER — METOPROLOL SUCCINATE ER 25 MG PO TB24
25.0000 mg | ORAL_TABLET | Freq: Every day | ORAL | 3 refills | Status: AC
  Filled 2023-12-10: qty 90, 90d supply, fill #0

## 2023-12-10 NOTE — Progress Notes (Signed)
 Cardiology Office Note    Date:  12/10/2023  ID:  Altie, Savard 20-Jan-1976, MRN [id number] Cardiologist: Maude Emmer, MD Cardiology APP:  Johnson Monica HERO, PA-C { :  History of Present Illness:    Monica Hernandez is a 48 y.o. female with past medical history of palpitations (PAC's and PVC's by prior monitor in 09/2019), GERD and history of gastric bypass who presents to the office today for overdue annual follow-up.  She was last examined by myself in 08/2021 and was overall doing well at that time and reported her palpitations had improved with reduction in caffeine  intake. She had recently reduced Toprol -XL to 12.5 mg daily and was continued on this with instructions to take an extra tablet if needed for breakthrough episodes.  In talking with the patient today, she reports overall doing well from a cardiac perspective since her last office visit. She has experienced more frequent palpitations over the past 1-2 months since being on Xyzal .  Notes lower extremity edema and insomnia while on this as well. Was intolerant to multiple allergy  medications in the past and reports having an anaphylactic reaction to an allergy  shot in the past as well. Prior to starting this, her palpitations were well-controlled. She still consumes a significant amount of caffeine  and is currently drinking about 3 pots of coffee a day. No recent chest pain or dyspnea on exertion.  No specific orthopnea or PND. 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  Studies Reviewed:   EKG: EKG is ordered today and demonstrates:   EKG Interpretation Date/Time:  Thursday December 10 2023 13:11:00 EDT Ventricular Rate:  69 PR Interval:  148 QRS Duration:  80 QT Interval:  400 QTC Calculation: 428 R Axis:   45  Text Interpretation: Normal sinus rhythm Normal ECG When compared with ECG of 28-Oct-2017 21:45, PREVIOUS ECG IS PRESENT Confirmed by Johnson Monica  ([id number]) on 12/10/2023 1:15:25 PM       CT Cardiac Scoring: 11/2018 FINDINGS: Non-cardiac: See separate report from [facility].   Ascending aorta: Normal diameter 3.2 cm   Pericardium: Normal   Coronary arteries: No calcium noted   IMPRESSION: Coronary calcium score of 0.  Echocardiogram: 09/2019 IMPRESSIONS     1. Left ventricular ejection fraction, by estimation, is 55 to 60%. The  left ventricle has normal function. The left ventricle has no regional  wall motion abnormalities. Left ventricular diastolic parameters were  normal.   2. Right ventricular systolic function is normal. The right ventricular  size is normal. There is normal pulmonary artery systolic pressure. The  estimated right ventricular systolic pressure is 18.8 mmHg.   3. Left atrial size was upper normal.   4. The mitral valve is grossly normal. Trivial mitral valve  regurgitation.   5. The aortic valve is tricuspid. Aortic valve regurgitation is not  visualized.   6. The inferior vena cava is dilated in size with >50% respiratory  variability, suggesting right atrial pressure of 8 mmHg.   Event Monitor: 09/2019 NSR Average HR 74 bpm Rare PAC/PVC < 1% beats No significant arrhythmias   Physical Exam:   VS:  BP 128/74   Pulse 69   Ht 5' 4 (1.626 m)   Wt 231 lb (104.8 kg)   SpO2 99%   BMI 39.65 kg/m    Wt Readings from Last 3 Encounters:  12/10/23 231 lb (104.8 kg)  09/28/23 232 lb (105.2 kg)  06/11/23 225 lb (102.1 kg)     GEN: Pleasant female appearing in no acute distress NECK: No JVD; No carotid bruits CARDIAC: RRR, no murmurs, rubs, gallops RESPIRATORY:  Clear to auscultation without rales, wheezing or rhonchi  ABDOMEN: Appears non-distended. No obvious abdominal masses. EXTREMITIES: No clubbing or cyanosis. No pitting edema.  Distal pedal pulses are 2+ bilaterally.   Assessment and Plan:   1. Palpitations - Prior monitor in 09/2019 showed rare PAC's and PVC's with  less than a 1% burden. She does report worsening symptoms while on Xyzal  as outlined above. Recommended stopping this given her worsening symptoms. I encouraged her to make us  aware if palpitations persist as Toprol -XL can be further titrated. Also recommended reducing caffeine  intake. Will continue Toprol -XL 25 mg daily.  2. History of chest pain - She had a coronary calcium score of 0 by CT in 09/2018. Denies any recent anginal symptoms. Continue with risk factor modification.  3. Allergies - She has experienced worsening insomnia, palpitations and lower extremity edema while taking Xyzal . She plans to stop this and resume Claritin as she tolerated it in the past.  Signed, Monica CHRISTELLA Qua, PA-C

## 2023-12-10 NOTE — Patient Instructions (Signed)
 Medication Instructions:  Your physician recommends that you continue on your current medications as directed. Please refer to the Current Medication list given to you today.  *If you need a refill on your cardiac medications before your next appointment, please call your pharmacy*  Lab Work: None ordered  If you have labs (blood work) drawn today and your tests are completely normal, you will receive your results only by: MyChart Message (if you have MyChart) OR A paper copy in the mail If you have any lab test that is abnormal or we need to change your treatment, we will call you to review the results.  Testing/Procedures: None ordered  Follow-Up: At [facility], you and your health needs are our priority.  As part of our continuing mission to provide you with exceptional heart care, our providers are all part of one team.  This team includes your primary Cardiologist (physician) and Advanced Practice Providers or APPs (Physician Assistants and Nurse Practitioners) who all work together to provide you with the care you need, when you need it.  Your next appointment:   1 year(s)  Provider:   You may see Maude Emmer, MD or one of the following Advanced Practice Providers on your designated Care Team:   Laymon Qua, PA-C  [place], [place] Olivia [place], [place]     We recommend signing up for the patient portal called MyChart.  Sign up information is provided on this After Visit Summary.  MyChart is used to connect with patients for Virtual Visits (Telemedicine).  Patients are able to view lab/test results, encounter notes, upcoming appointments, etc.  Non-urgent messages can be sent to your provider as well.   To learn more about what you can do with MyChart, go to [email or web address].   Other Instructions

## 2024-02-01 ENCOUNTER — Ambulatory Visit: Admitting: Family Medicine

## 2024-02-01 ENCOUNTER — Encounter: Payer: Self-pay | Admitting: Family Medicine

## 2024-02-01 VITALS — BP 126/76 | HR 70 | Ht 64.0 in | Wt 229.0 lb

## 2024-02-01 DIAGNOSIS — R7303 Prediabetes: Secondary | ICD-10-CM

## 2024-02-01 DIAGNOSIS — J0100 Acute maxillary sinusitis, unspecified: Secondary | ICD-10-CM

## 2024-02-01 MED ORDER — AMOXICILLIN-POT CLAVULANATE 875-125 MG PO TABS: 1.0000 | ORAL_TABLET | Freq: Two times a day (BID) | ORAL | 0 refills | Status: AC

## 2024-02-01 NOTE — Progress Notes (Unsigned)
 Established Patient Office Visit  Subjective:  Patient ID: Monica Hernandez, female    DOB: 1975/10/27  Age: 48 y.o. MRN: [id number]  CC:  Chief Complaint  Patient presents with  . Medical Management of Chronic Issues    Follow up  . Sinusitis    Pain and pressure threw out the face, ear is clogged, congestion for five days    HPI Monica Hernandez is a 48 y.o. female with past medical history of migraines, obesity, prediabetes presents for f/u of  chronic medical conditions with the above complaints.  For the details of today's visit, please refer to the assessment and plan.      Stopped xyzal  due to PVCs Certicti   Tyelnol cold helped for a alitle Past Medical History:  Diagnosis Date  . Allergy    . Anemia   . Asthma   . Dysplastic nevus 09/23/2022   Right shoulder post - Moderate  . Eczema   . GERD (gastroesophageal reflux disease)   . History of kidney stones   . Hypertension   . Kidney stone   . Mitral valve prolapse    was told as a kid that she had this but during ECHO for eval, ECHO did not indicate this , see 08-25-16 epic result   . PONV (postoperative nausea and vomiting)    epidural with c section caused N/V   . Pre-diabetes   . Recurrent upper respiratory infection (URI)   . Ulcer     Past Surgical History:  Procedure Laterality Date  . ABDOMINAL HYSTERECTOMY     total  . ADENOIDECTOMY    . ADENOIDECTOMY, TONSILLECTOMY AND MYRINGOTOMY WITH TUBE PLACEMENT    . CESAREAN SECTION    . FRACTURE SURGERY    . GASTRIC ROUX-EN-Y N/A 03/31/2017   Procedure: LAPAROSCOPIC ROUX-EN-Y GASTRIC BYPASS WITH UPPER ENDOSCOPY;  Surgeon: Stevie, Herlene Righter, MD;  Location: [facility];  Service: General;  Laterality: N/A;  . HERNIA REPAIR     umbilical  . LEG SURGERY Left   . MULTIPLE TOOTH EXTRACTIONS Bilateral 2023  . TONSILLECTOMY    . TUBAL LIGATION      Family History  Problem Relation Age of Onset  . Hypertension Mother   . Stroke Mother   . Thyroid  cancer  Mother   . Stroke Father   . Lung cancer Father   . Brain cancer Father   . Bone cancer Father   . Hypertension Sister   . Thyroid  cancer Sister   . Hypertension Brother   . Heart disease Maternal Grandmother   . Diabetes Maternal Grandmother   . Asthma Other   . Cancer Other   . Stomach cancer Neg Hx   . Colon cancer Neg Hx     Social History   Socioeconomic History  . Marital status: Married    Spouse name: Not on file  . Number of children: 1  . Years of education: Not on file  . Highest education level: Some college, no degree  Occupational History    Employer: [employer]    Comment: heart care    Comment: [employer]  Tobacco Use  . Smoking status: Former    Current packs/day: 0.00    Average packs/day: 0.5 packs/day for 15.0 years (7.5 ttl pk-yrs)    Types: Cigarettes    Start date: 51    Quit date: 2010    Years since quitting: 15.9  . Smokeless tobacco: Never  . Tobacco comments:    smoked  since age 61 ; quit in 2010   Vaping Use  . Vaping status: Never Used  Substance and Sexual Activity  . Alcohol use: No  . Drug use: No  . Sexual activity: Yes    Partners: Male    Birth control/protection: Surgical  Other Topics Concern  . Not on file  Social History Narrative   Live with her spouse ans her son   Social Drivers of Corporate Investment Banker Strain: Low Risk  (06/10/2023)   Overall Financial Resource Strain (CARDIA)   . Difficulty of Paying Living Expenses: Not very hard  Food Insecurity: Food Insecurity Present (06/10/2023)   Hunger Vital Sign   . Worried About Programme Researcher, Broadcasting/film/video in the Last Year: Never true   . Ran Out of Food in the Last Year: Sometimes true  Transportation Needs: No Transportation Needs (06/10/2023)   PRAPARE - Transportation   . Lack of Transportation (Medical): No   . Lack of Transportation (Non-Medical): No  Physical Activity: Sufficiently Active (06/10/2023)   Exercise Vital Sign   . Days of Exercise per Week: 5 days    . Minutes of Exercise per Session: 60 min  Stress: No Stress Concern Present (06/10/2023)   [employer] of Occupational Health - Occupational Stress Questionnaire   . Feeling of Stress : Not at all  Social Connections: Moderately Isolated (06/10/2023)   Social Connection and Isolation Panel   . Frequency of Communication with Friends and Family: Three times a week   . Frequency of Social Gatherings with Friends and Family: Once a week   . Attends Religious Services: Never   . Active Member of Clubs or Organizations: No   . Attends Banker Meetings: Not on file   . Marital Status: Married  Catering Manager Violence: Not on file    Outpatient Medications Prior to Visit  Medication Sig Dispense Refill  . acetaminophen  (TYLENOL ) 500 MG tablet Take 1,000 mg by mouth every 6 (six) hours as needed for mild pain (pain score 1-3) or moderate pain (pain score 4-6).    . albuterol  (VENTOLIN  HFA) 108 (90 Base) MCG/ACT inhaler Inhale 2 puffs into the lungs every 6 (six) hours as needed for wheezing or shortness of breath. 18 g 1  . Cholecalciferol  (VITAMIN D3) 25 MCG (1000 UT) CAPS Take 1 capsule (1,000 Units total) by mouth daily. 60 capsule 3  . EPINEPHrine  0.3 mg/0.3 mL IJ SOAJ injection Inject 0.3 mg into the muscle as needed for anaphylaxis. 2 each 1  . furosemide  (LASIX ) 20 MG tablet Take 20 mg daily as needed for swelling, weight gain 90 tablet 3  . levocetirizine (XYZAL ) 5 MG tablet Take 1 tablet (5 mg total) by mouth every evening. 60 tablet 1  . metoprolol  succinate (TOPROL -XL) 25 MG 24 hr tablet Take 1 tablet (25 mg total) by mouth daily. 90 tablet 3  . mometasone  (ELOCON ) 0.1 % ointment Apply 2 times daily for 3 weeks, allow ointment to sit on skin for 30 mins prior to washing off.  Use 3 weeks on then 3 weeks off until area improves. 45 g 6  . Multiple Vitamins-Minerals (BARIATRIC MULTIVITAMINS/IRON PO) Take 2 tablets by mouth 2 (two) times daily. Chewable tablets    .  pantoprazole  (PROTONIX ) 40 MG tablet Take 1 tablet (40 mg total) by mouth 2 (two) times daily before a meal for 14 days. 28 tablet 0   No facility-administered medications prior to visit.    Allergies  Allergen  Reactions  . Bee Venom Anaphylaxis  . Contrave [Naltrexone-Bupropion  Hcl Er] Nausea And Vomiting    Pt states it causes bloody stool, bloody vomit, and heart palpitations  . Erythromycin Nausea And Vomiting and Rash    Wheezing   . Shellfish Allergy  Anaphylaxis  . Zithromax [Azithromycin] Nausea And Vomiting, Rash and Other (See Comments)  . Belviq [Lorcaserin Hcl] Other (See Comments)    Causes changes in vision and respiratory issues  . Fexofenadine-Pseudoephed Er Nausea And Vomiting and Nausea Only  . Liraglutide  -Weight Management Other (See Comments)    Causes fainting (per pt, dose was toohigh and decreased gluc too much)  . Qsymia [Phentermine-Topiramate Er] Other (See Comments)    Causes change in vision and respiratory issues   . Sulfa Antibiotics Rash and Dermatitis  . Levocetirizine Other (See Comments)    headache  . Methylprednisolone  Other (See Comments)    Low heart rate  . Naltrexone Other (See Comments)    Bloody stools.   . Fish Allergy  Rash    ROS Review of Systems  Constitutional:  Negative for fatigue and fever.  HENT:  Positive for congestion, ear pain (left), postnasal drip, sinus pressure, sinus pain and sore throat (little). Negative for dental problem, rhinorrhea and sneezing.   Respiratory:  Negative for cough, chest tightness, shortness of breath and wheezing.       Objective:    Physical Exam HENT:     Head: Normocephalic.     Mouth/Throat:     Mouth: Mucous membranes are moist.  Cardiovascular:     Rate and Rhythm: Normal rate.     Heart sounds: Normal heart sounds.  Pulmonary:     Effort: Pulmonary effort is normal.     Breath sounds: Normal breath sounds.  Neurological:     Mental Status: She is alert.     BP 126/76    Pulse 70   Ht 5' 4 (1.626 m)   Wt 229 lb (103.9 kg)   SpO2 99%   BMI 39.31 kg/m  Wt Readings from Last 3 Encounters:  02/01/24 229 lb (103.9 kg)  12/10/23 231 lb (104.8 kg)  09/28/23 232 lb (105.2 kg)    Lab Results  Component Value Date   TSH 2.020 09/28/2023   Lab Results  Component Value Date   WBC 6.6 09/28/2023   HGB 11.9 09/28/2023   HCT 37.2 09/28/2023   MCV 90 09/28/2023   PLT 249 09/28/2023   Lab Results  Component Value Date   NA 144 09/28/2023   K 4.8 09/28/2023   CO2 22 09/28/2023   GLUCOSE 83 09/28/2023   BUN 14 09/28/2023   CREATININE 0.80 09/28/2023   BILITOT 0.3 09/28/2023   ALKPHOS 87 09/28/2023   AST 27 09/28/2023   ALT 16 09/28/2023   PROT 6.5 09/28/2023   ALBUMIN 4.1 09/28/2023   CALCIUM 9.3 09/28/2023   ANIONGAP 11 12/13/2022   EGFR 90 09/28/2023   Lab Results  Component Value Date   CHOL 159 09/28/2023   Lab Results  Component Value Date   HDL 61 09/28/2023   Lab Results  Component Value Date   LDLCALC 87 09/28/2023   Lab Results  Component Value Date   TRIG 50 09/28/2023   Lab Results  Component Value Date   CHOLHDL 2.6 09/28/2023   Lab Results  Component Value Date   HGBA1C 5.9 (H) 09/28/2023      Assessment & Plan:  There are no diagnoses linked to this encounter.  Follow-up: No follow-ups on file.   Takoda Siedlecki  Z Bacchus, FNP

## 2024-02-01 NOTE — Patient Instructions (Addendum)
 I appreciate the opportunity to provide care to you today!    Follow up:  5 months  Sinusitis Start taking Augmentin  BID for 5 days Increase fluid intake and allow for plenty of rest. Take Tylenol  as needed for pain, fever, or general discomfort. Perform warm saltwater gargles 3-4 times daily to help with throat pain or discomfort. (Mix 1/2 teaspoon of salt in a glass of warm water and gargle several times daily to reduce throat inflammation and soothe irritation.) Ginger tea: Reduces throat irritation and can help with nausea. Look for sugar-free or honey-based throat lozenges to help with a sore throat. Use a humidifier at bedtime to help with cough and nasal congestion. For nasal congestion: The use of heated humidified air is a safe and effective therapy. Saline nasal sprays may also help alleviate nasal symptoms of the common cold. For cough: Treatment with honey may reduce cough frequency and severity. Follow up if your symptoms do not improve after 7 to 10 days of symptom onset.    Please follow up if your symptoms worsen or fail to improve.    Please continue to a heart-healthy diet and increase your physical activities. Try to exercise for at least five days a week.    It was a pleasure to see you and I look forward to continuing to work together on your health and well-being. Please do not hesitate to call the office if you need care or have questions about your care.  In case of emergency, please visit the Emergency Department for urgent care, or contact our clinic at [phone] to schedule an appointment. We're here to help you!   Have a wonderful day and week. With Gratitude, Meade JENEANE Gerlach MSN, FNP-BC, PMHNP-BC

## 2024-02-06 DIAGNOSIS — J0100 Acute maxillary sinusitis, unspecified: Secondary | ICD-10-CM | POA: Insufficient documentation

## 2024-02-06 NOTE — Assessment & Plan Note (Signed)
 Lifestyle modifications for prediabetes were discussed, including adopting a heart-healthy diet and increasing physical activity. The patient was encouraged to decrease her intake of high-sugar foods and beverages. She verbalized understanding and is aware of the plan of care.

## 2024-02-07 NOTE — Assessment & Plan Note (Signed)
 Start Augmentin  twice daily for 5 days. Increase fluids and get plenty of rest. Take Tylenol  as needed for pain or fever. Use warm saltwater gargles, honey or sugar-free lozenges, and ginger tea for throat discomfort. Use a humidifier and saline nasal spray for congestion. Follow up if symptoms do not improve after 7-10 days.

## 2024-02-15 ENCOUNTER — Encounter: Payer: Self-pay | Admitting: Family Medicine

## 2024-02-15 ENCOUNTER — Telehealth: Admitting: Family Medicine

## 2024-02-15 ENCOUNTER — Other Ambulatory Visit (HOSPITAL_COMMUNITY): Payer: Self-pay

## 2024-02-15 DIAGNOSIS — B9689 Other specified bacterial agents as the cause of diseases classified elsewhere: Secondary | ICD-10-CM

## 2024-02-15 DIAGNOSIS — J019 Acute sinusitis, unspecified: Secondary | ICD-10-CM | POA: Diagnosis not present

## 2024-02-15 MED ORDER — DOXYCYCLINE HYCLATE 100 MG PO TABS: 100.0000 mg | ORAL_TABLET | Freq: Two times a day (BID) | ORAL | 0 refills | Status: AC

## 2024-02-15 NOTE — Progress Notes (Signed)
 E-Visit for Sinus Problems  We are sorry that you are not feeling well.  Here is how we plan to help!  Based on what you have shared with me it looks like you have sinusitis.  Sinusitis is inflammation and infection in the sinus cavities of the head.  Based on your presentation I believe you most likely have Acute Bacterial Sinusitis.  This is an infection caused by bacteria and is treated with antibiotics. I have prescribed Doxycycline 100mg  by mouth twice a day for 7 days. You may use an oral decongestant such as Mucinex D or if you have glaucoma or high blood pressure use plain Mucinex. Saline nasal spray help and can safely be used as often as needed for congestion.  If you develop worsening sinus pain, fever or notice severe headache and vision changes, or if symptoms are not better after completion of antibiotic, please schedule an appointment with a health care provider.    Sinus infections are not as easily transmitted as other respiratory infection, however we still recommend that you avoid close contact with loved ones, especially the very young and elderly.  Remember to wash your hands thoroughly throughout the day as this is the number one way to prevent the spread of infection!  Home Care: Only take medications as instructed by your medical team. Complete the entire course of an antibiotic. Do not take these medications with alcohol. A steam or ultrasonic humidifier can help congestion.  You can place a towel over your head and breathe in the steam from hot water coming from a faucet. Avoid close contacts especially the very young and the elderly. Cover your mouth when you cough or sneeze. Always remember to wash your hands.  Get Help Right Away If: You develop worsening fever or sinus pain. You develop a severe head ache or visual changes. Your symptoms persist after you have completed your treatment plan.  Make sure you Understand these instructions. Will watch your  condition. Will get help right away if you are not doing well or get worse.  Your e-visit answers were reviewed by a board certified advanced clinical practitioner to complete your personal care plan.  Depending on the condition, your plan could have included both over the counter or prescription medications.  If there is a problem please reply  once you have received a response from your provider.  Your safety is important to us .  If you have drug allergies check your prescription carefully.    You can use MyChart to ask questions about today's visit, request a non-urgent call back, or ask for a work or school excuse for 24 hours related to this e-Visit. If it has been greater than 24 hours you will need to follow up with your provider, or enter a new e-Visit to address those concerns.  You will get an e-mail in the next two days asking about your experience.  I hope that your e-visit has been valuable and will speed your recovery. Thank you for using e-visits.  I have spent 5 minutes in review of e-visit questionnaire, review and updating patient chart, medical decision making and response to patient.   Chiquita CHRISTELLA Barefoot, NP

## 2024-03-02 ENCOUNTER — Encounter: Payer: Self-pay | Admitting: Family Medicine

## 2024-03-02 NOTE — Telephone Encounter (Signed)
 Please ask the patient to provide the name of the weight loss medication and to verify with her insurance whether it is covered.

## 2024-03-04 NOTE — Telephone Encounter (Signed)
 Wegovy  (semaglutide ) is only available as a once-weekly injectable medication. There is no oral (pill) formulation of Wegovy .

## 2024-03-04 NOTE — Telephone Encounter (Signed)
 yes

## 2024-03-15 ENCOUNTER — Other Ambulatory Visit: Payer: Self-pay

## 2024-03-15 ENCOUNTER — Ambulatory Visit
Admission: EM | Admit: 2024-03-15 | Discharge: 2024-03-15 | Disposition: A | Discharge status: Home / Self Care | Attending: Family Medicine | Admitting: Family Medicine

## 2024-03-15 ENCOUNTER — Other Ambulatory Visit (HOSPITAL_BASED_OUTPATIENT_CLINIC_OR_DEPARTMENT_OTHER): Payer: Self-pay

## 2024-03-15 DIAGNOSIS — R42 Dizziness and giddiness: Secondary | ICD-10-CM | POA: Diagnosis not present

## 2024-03-15 DIAGNOSIS — M549 Dorsalgia, unspecified: Secondary | ICD-10-CM | POA: Diagnosis not present

## 2024-03-15 MED ORDER — TIZANIDINE HCL 4 MG PO TABS
4.0000 mg | ORAL_TABLET | Freq: Three times a day (TID) | ORAL | 0 refills | Status: AC | PRN
  Filled 2024-03-15: qty 15, 5d supply, fill #0

## 2024-03-15 MED ORDER — MECLIZINE HCL 25 MG PO TABS
25.0000 mg | ORAL_TABLET | Freq: Three times a day (TID) | ORAL | 0 refills | Status: AC | PRN
  Filled 2024-03-15: qty 30, 10d supply, fill #0

## 2024-03-15 MED ORDER — LIDOCAINE 5 % EX PTCH
1.0000 | MEDICATED_PATCH | CUTANEOUS | 0 refills | Status: AC
  Filled 2024-03-15: qty 30, 30d supply, fill #0

## 2024-03-15 NOTE — ED Provider Notes (Signed)
 " [facility]    CSN: [id number] Arrival date & time: 03/15/24  1056      History   Chief Complaint No chief complaint on file.   HPI Monica Hernandez is a 49 y.o. female.   Patient presenting today with 1 day history of room spinning dizziness, worse with moving head or laying back.  Denies headache, visual change, nausea, vomiting, recent head injury, weakness numbness or tingling of extremities.  So far not trying anything over-the-counter for symptoms.  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  Sometimes the pain radiates down causing tingling to the right upper extremity.  Denies loss of range of motion, weakness, numbness, swelling, discoloration.  Trying some Tylenol  with mild temporary benefit.    Past Medical History:  Diagnosis Date   Allergy     Anemia    Asthma    Dysplastic nevus 09/23/2022   Right shoulder post - Moderate   Eczema    GERD (gastroesophageal reflux disease)    History of kidney stones    Hypertension    Kidney stone    Mitral valve prolapse    was told as a kid that she had this but during ECHO for eval, ECHO did not indicate this , see 08-25-16 epic result    PONV (postoperative nausea and vomiting)    epidural with c section caused N/V    Pre-diabetes    Recurrent upper respiratory infection (URI)    Ulcer     Patient Active Problem List   Diagnosis Date Noted   Subacute maxillary sinusitis 02/06/2024   Friction blisters of the soles 09/28/2023   Viral sinusitis 09/28/2023   Lyme disease 09/28/2023   Acute bronchitis 06/11/2023   Fatigue 01/29/2023   Leg cramps 12/29/2022   Multiple nevi 05/19/2022   Onychomycosis of toenail 02/15/2022   Need for influenza vaccination 11/15/2021   Immunization due 11/15/2021   COVID-19 virus infection 10/09/2021   History of gastric bypass 08/16/2021   Joint swelling 04/01/2021   Prediabetes 04/01/2021   Abnormal mammogram 08/07/2020    Encounter for annual general medical examination with abnormal findings in adult 07/03/2020   Screening due 07/03/2020   Palpitations 07/03/2020   Migraine 07/03/2020   Asthma 07/03/2020   Multiple allergies 07/03/2020   Arthritis 07/03/2020   Status post hysterectomy 07/03/2020   Chronic allergic rhinitis 02/15/2018   Morbid obesity (HCC) 03/31/2017    Past Surgical History:  Procedure Laterality Date   ABDOMINAL HYSTERECTOMY     total   ADENOIDECTOMY     ADENOIDECTOMY, TONSILLECTOMY AND MYRINGOTOMY WITH TUBE PLACEMENT     CESAREAN SECTION     FRACTURE SURGERY     GASTRIC ROUX-EN-Y N/A 03/31/2017   Procedure: LAPAROSCOPIC ROUX-EN-Y GASTRIC BYPASS WITH UPPER ENDOSCOPY;  Surgeon: Stevie, Herlene Righter, MD;  Location: [facility];  Service: General;  Laterality: N/A;   HERNIA REPAIR     umbilical   LEG SURGERY Left    MULTIPLE TOOTH EXTRACTIONS Bilateral 2023   TONSILLECTOMY     TUBAL LIGATION      OB History   No obstetric history on file.      Home Medications    Prior to Admission medications  Medication Sig Start Date End Date Taking? Authorizing Provider  lidocaine  (LIDODERM ) 5 % Place 1 patch onto the skin daily. Remove & Discard patch within 12 hours or as directed by MD 03/15/24  Yes Stuart Vernell Norris, PA-C  meclizine  (  ANTIVERT ) 25 MG tablet Take 1 tablet (25 mg total) by mouth 3 (three) times daily as needed for dizziness. May cause drowsiness 03/15/24  Yes Stuart Vernell Norris, PA-C  tiZANidine  (ZANAFLEX ) 4 MG tablet Take 1 tablet (4 mg total) by mouth every 8 (eight) hours as needed for muscle spasms. Do not drink alcohol or drive while taking this medication.  May cause drowsiness. 03/15/24  Yes Stuart Vernell Norris, PA-C  acetaminophen  (TYLENOL ) 500 MG tablet Take 1,000 mg by mouth every 6 (six) hours as needed for mild pain (pain score 1-3) or moderate pain (pain score 4-6).    [provider]  albuterol  (VENTOLIN  HFA) 108 (90 Base) MCG/ACT inhaler  Inhale 2 puffs into the lungs every 6 (six) hours as needed for wheezing or shortness of breath. 06/11/23   Tobie Suzzane POUR, MD  Cholecalciferol  (VITAMIN D3) 25 MCG (1000 UT) CAPS Take 1 capsule (1,000 Units total) by mouth daily. 08/18/21   Paseda, Folashade R, FNP  EPINEPHrine  0.3 mg/0.3 mL IJ SOAJ injection Inject 0.3 mg into the muscle as needed for anaphylaxis. 07/03/20   Elnor Fairy HERO, NP  furosemide  (LASIX ) 20 MG tablet Take 20 mg daily as needed for swelling, weight gain 11/22/20   Strader, Laymon HERO, PA-C  levocetirizine (XYZAL ) 5 MG tablet Take 1 tablet (5 mg total) by mouth every evening. 09/28/23   Bacchus, Gloria Z, FNP  metoprolol  succinate (TOPROL -XL) 25 MG 24 hr tablet Take 1 tablet (25 mg total) by mouth daily. 12/10/23   Strader, Laymon HERO, PA-C  mometasone  (ELOCON ) 0.1 % ointment Apply 2 times daily for 3 weeks, allow ointment to sit on skin for 30 mins prior to washing off.  Use 3 weeks on then 3 weeks off until area improves. 09/23/23   Alm Delon SAILOR, DO  Multiple Vitamins-Minerals (BARIATRIC MULTIVITAMINS/IRON PO) Take 2 tablets by mouth 2 (two) times daily. Chewable tablets    [provider]  pantoprazole  (PROTONIX ) 40 MG tablet Take 1 tablet (40 mg total) by mouth 2 (two) times daily before a meal for 14 days. 12/13/22 12/10/23  Mesner, Selinda, MD  Carbinoxamine  Maleate 4 MG TABS Take 1 tablet (4 mg total) by mouth 3 (three) times daily as needed. 07/28/18 09/02/18  Iva Marty Saltness, MD    Family History Family History  Problem Relation Age of Onset   Hypertension Mother    Stroke Mother    Thyroid  cancer Mother    Stroke Father    Lung cancer Father    Brain cancer Father    Bone cancer Father    Hypertension Sister    Thyroid  cancer Sister    Hypertension Brother    Heart disease Maternal Grandmother    Diabetes Maternal Grandmother    Asthma Other    Cancer Other    Stomach cancer Neg Hx    Colon cancer Neg Hx     Social History Social  History[1]   Allergies   Bee venom, Contrave [naltrexone-bupropion  hcl er], Erythromycin, Shellfish allergy , Zithromax [azithromycin], Belviq [lorcaserin hcl], Fexofenadine-pseudoephed er, Liraglutide  -weight management, Qsymia [phentermine-topiramate er], Sulfa antibiotics, Levocetirizine, Methylprednisolone , Naltrexone, and Fish allergy    Review of Systems Review of Systems PER HPI  Physical Exam Triage Vital Signs ED Triage Vitals  Encounter Vitals Group     BP 03/15/24 1205 126/77     Girls Systolic BP Percentile --      Girls Diastolic BP Percentile --      Boys Systolic BP Percentile --  Boys Diastolic BP Percentile --      Pulse Rate 03/15/24 1205 67     Resp 03/15/24 1205 20     Temp 03/15/24 1205 98.1 F (36.7 C)     Temp Source 03/15/24 1205 Oral     SpO2 03/15/24 1205 97 %     Weight --      Height --      Head Circumference --      Peak Flow --      Pain Score 03/15/24 1203 7     Pain Loc --      Pain Education --      Exclude from Growth Chart --    Orthostatic VS for the past 24 hrs:  BP- Lying Pulse- Lying BP- Sitting Pulse- Sitting BP- Standing at 0 minutes Pulse- Standing at 0 minutes  03/15/24 1213 116/76 60 113/79 61 129/80 58    Updated Vital Signs BP 126/77 (BP Location: Right Arm)   Pulse 67   Temp 98.1 F (36.7 C) (Oral)   Resp 20   SpO2 97%   Visual Acuity Right Eye Distance:   Left Eye Distance:   Bilateral Distance:    Right Eye Near:   Left Eye Near:    Bilateral Near:     Physical Exam Vitals and nursing note reviewed.  Constitutional:      Appearance: Normal appearance. She is not ill-appearing.  HENT:     Head: Atraumatic.     Mouth/Throat:     Mouth: Mucous membranes are moist.  Eyes:     Extraocular Movements: Extraocular movements intact.     Conjunctiva/sclera: Conjunctivae normal.     Pupils: Pupils are equal, round, and reactive to light.  Cardiovascular:     Rate and Rhythm: Normal rate and regular rhythm.      Heart sounds: Normal heart sounds.  Pulmonary:     Effort: Pulmonary effort is normal.     Breath sounds: Normal breath sounds.  Musculoskeletal:        General: Tenderness present. No swelling or signs of injury. Normal range of motion.     Cervical back: Normal range of motion and neck supple.     Comments: No midline spinal tenderness to palpation diffusely.  Mild tenderness to palpation to the right mid upper back musculature.  Range of motion of the right upper extremity intact  Skin:    General: Skin is warm and dry.     Findings: No bruising or erythema.  Neurological:     General: No focal deficit present.     Mental Status: She is alert and oriented to person, place, and time. Mental status is at baseline.     Cranial Nerves: No cranial nerve deficit.     Motor: No weakness.     Gait: Gait normal.  Psychiatric:        Mood and Affect: Mood normal.        Thought Content: Thought content normal.        Judgment: Judgment normal.      UC Treatments / Results  Labs (all labs ordered are listed, but only abnormal results are displayed) Labs Reviewed - No data to display  EKG   Radiology No results found.  Procedures Procedures (including critical care time)  Medications Ordered in UC Medications - No data to display  Initial Impression / Assessment and Plan / UC Course  I have reviewed the triage vital signs and the nursing notes.  Pertinent  labs & imaging results that were available during my care of the patient were reviewed by me and considered in my medical decision making (see chart for details).     Vital signs and exam very reassuring today with no focal neurologic deficits.  EKG performed in triage for the dizziness shows sinus bradycardia at 58 bpm without acute ST or T wave changes, orthostatic vital signs without significant change positionally.  Symptoms consistent with vertigo.  Will treat with meclizine , Epley maneuvers, supportive  over-the-counter medications and home care.  Regarding the mid back pain, appears muscular in nature.  Treat with Tylenol , lidocaine  patches, heat, massage, Zanaflex  as needed.  Return for worsening or unresolving symptoms.  Work note given.  Final Clinical Impressions(s) / UC Diagnoses   Final diagnoses:  Vertigo  Mid back pain     Discharge Instructions      Try the meclizine  as needed for dizzy spells in addition to the Epley maneuvers and slow deliberate head movements.  Regarding your back pain, I suspect this to be muscular in nature.  I have prescribed some lidocaine  patches and a muscle relaxer to be tried as needed in addition to heat, massage, Tylenol  as needed.  Follow-up for worsening or unresolving symptoms.    ED Prescriptions     Medication Sig Dispense Auth. Provider   lidocaine  (LIDODERM ) 5 % Place 1 patch onto the skin daily. Remove & Discard patch within 12 hours or as directed by MD 30 patch Stuart Vernell Norris, PA-C   tiZANidine  (ZANAFLEX ) 4 MG tablet Take 1 tablet (4 mg total) by mouth every 8 (eight) hours as needed for muscle spasms. Do not drink alcohol or drive while taking this medication.  May cause drowsiness. 15 tablet Stuart Vernell Norris, PA-C   meclizine  (ANTIVERT ) 25 MG tablet Take 1 tablet (25 mg total) by mouth 3 (three) times daily as needed for dizziness. May cause drowsiness 30 tablet Stuart Vernell Norris, [place]      PDMP not reviewed this encounter.    [1]  Social History Tobacco Use   Smoking status: Former    Current packs/day: 0.00    Average packs/day: 0.5 packs/day for 15.0 years (7.5 ttl pk-yrs)    Types: Cigarettes    Start date: 28    Quit date: 2010    Years since quitting: 16.0   Smokeless tobacco: Never   Tobacco comments:    smoked since age 68 ; quit in 2010   Vaping Use   Vaping status: Never Used  Substance Use Topics   Alcohol use: No   Drug use: No     Stuart Vernell Norris, PA-C 03/15/24 1518  "

## 2024-03-15 NOTE — ED Triage Notes (Signed)
 Pt reports she has mid to upper back pain, dizziness, and right hand tingling with lifting hand x 1 day   Denies any new meds

## 2024-03-15 NOTE — Discharge Instructions (Signed)
 Try the meclizine  as needed for dizzy spells in addition to the Epley maneuvers and slow deliberate head movements.  Regarding your back pain, I suspect this to be muscular in nature.  I have prescribed some lidocaine  patches and a muscle relaxer to be tried as needed in addition to heat, massage, Tylenol  as needed.  Follow-up for worsening or unresolving symptoms.

## 2024-03-17 ENCOUNTER — Other Ambulatory Visit: Payer: Self-pay | Admitting: Family Medicine

## 2024-04-15 ENCOUNTER — Telehealth: Admitting: Student

## 2024-04-15 ENCOUNTER — Other Ambulatory Visit (HOSPITAL_BASED_OUTPATIENT_CLINIC_OR_DEPARTMENT_OTHER): Payer: Self-pay

## 2024-04-15 DIAGNOSIS — B9689 Other specified bacterial agents as the cause of diseases classified elsewhere: Secondary | ICD-10-CM

## 2024-04-15 MED ORDER — DOXYCYCLINE HYCLATE 100 MG PO CAPS
100.0000 mg | ORAL_CAPSULE | Freq: Two times a day (BID) | ORAL | 0 refills | Status: AC
  Filled 2024-04-15: qty 14, 7d supply, fill #0

## 2024-04-15 NOTE — Progress Notes (Signed)
"      E-Visit for Sinus Problems  We are sorry that you are not feeling well.  Here is how we plan to help!  Based on what you have shared with me it looks like you have sinusitis.  Sinusitis is inflammation and infection in the sinus cavities of the head.  Based on your presentation I believe you most likely have Acute Bacterial Sinusitis.  This is an infection caused by bacteria and is treated with antibiotics. I have prescribed Doxycycline  100mg  by mouth twice a day for 7 days. You may use an oral decongestant such as Mucinex D or if you have glaucoma or high blood pressure use plain Mucinex. Saline nasal spray help and can safely be used as often as needed for congestion.  If you develop worsening sinus pain, fever or notice severe headache and vision changes, or if symptoms are not better after completion of antibiotic, please schedule an appointment with a health care provider.    Sinus infections are not as easily transmitted as other respiratory infection, however we still recommend that you avoid close contact with loved ones, especially the very young and elderly.  Remember to wash your hands thoroughly throughout the day as this is the number one way to prevent the spread of infection!  Home Care: Only take medications as instructed by your medical team. Complete the entire course of an antibiotic. Do not take these medications with alcohol. A steam or ultrasonic humidifier can help congestion.  You can place a towel over your head and breathe in the steam from hot water coming from a faucet. Avoid close contacts especially the very young and the elderly. Cover your mouth when you cough or sneeze. Always remember to wash your hands.  Get Help Right Away If: You develop worsening fever or sinus pain. You develop a severe head ache or visual changes. Your symptoms persist after you have completed your treatment plan.  Make sure you Understand these instructions. Will watch your  condition. Will get help right away if you are not doing well or get worse.  Your e-visit answers were reviewed by a board certified advanced clinical practitioner to complete your personal care plan.  Depending on the condition, your plan could have included both over the counter or prescription medications.  If there is a problem please reply  once you have received a response from your provider.  Your safety is important to us .  If you have drug allergies check your prescription carefully.    You can use MyChart to ask questions about todays visit, request a non-urgent call back, or ask for a work or school excuse for 24 hours related to this e-Visit. If it has been greater than 24 hours you will need to follow up with your provider, or enter a new e-Visit to address those concerns.  You will get an e-mail in the next two days asking about your experience.  I hope that your e-visit has been valuable and will speed your recovery. Thank you for using e-visits.  I have spent 7 minutes in review of e-visit questionnaire, review and updating patient chart, medical decision making and response to patient.   Leita FORBES Molly, PA-C     "

## 2024-07-01 ENCOUNTER — Ambulatory Visit: Admitting: Family Medicine

## 2024-09-26 ENCOUNTER — Ambulatory Visit: Admitting: Dermatology
